# Patient Record
Sex: Male | Born: 1960 | Race: White | Hispanic: No | Marital: Married | Smoking: Never smoker
Health system: Southern US, Community
[De-identification: ages and names within clinical notes are randomized; demographics above are authoritative.]

## PROBLEM LIST (undated history)

## (undated) DIAGNOSIS — D649 Anemia, unspecified: Secondary | ICD-10-CM

## (undated) DIAGNOSIS — M1990 Unspecified osteoarthritis, unspecified site: Secondary | ICD-10-CM

## (undated) DIAGNOSIS — C4490 Unspecified malignant neoplasm of skin, unspecified: Secondary | ICD-10-CM

## (undated) DIAGNOSIS — M7989 Other specified soft tissue disorders: Secondary | ICD-10-CM

## (undated) DIAGNOSIS — Z87442 Personal history of urinary calculi: Secondary | ICD-10-CM

## (undated) DIAGNOSIS — N189 Chronic kidney disease, unspecified: Secondary | ICD-10-CM

## (undated) DIAGNOSIS — E559 Vitamin D deficiency, unspecified: Secondary | ICD-10-CM

## (undated) DIAGNOSIS — M549 Dorsalgia, unspecified: Secondary | ICD-10-CM

## (undated) DIAGNOSIS — M2550 Pain in unspecified joint: Secondary | ICD-10-CM

## (undated) DIAGNOSIS — L309 Dermatitis, unspecified: Secondary | ICD-10-CM

## (undated) DIAGNOSIS — T7840XA Allergy, unspecified, initial encounter: Secondary | ICD-10-CM

## (undated) DIAGNOSIS — Z5189 Encounter for other specified aftercare: Secondary | ICD-10-CM

## (undated) DIAGNOSIS — I10 Essential (primary) hypertension: Secondary | ICD-10-CM

## (undated) DIAGNOSIS — Q613 Polycystic kidney, unspecified: Secondary | ICD-10-CM

## (undated) DIAGNOSIS — Z940 Kidney transplant status: Secondary | ICD-10-CM

## (undated) HISTORY — DX: Anemia, unspecified: D64.9

## (undated) HISTORY — DX: Dorsalgia, unspecified: M54.9

## (undated) HISTORY — DX: Pain in unspecified joint: M25.50

## (undated) HISTORY — DX: Other specified soft tissue disorders: M79.89

## (undated) HISTORY — DX: Kidney transplant status: Z94.0

## (undated) HISTORY — PX: KNEE ARTHROSCOPY: SUR90

## (undated) HISTORY — DX: Vitamin D deficiency, unspecified: E55.9

## (undated) HISTORY — PX: MOHS SURGERY: SUR867

## (undated) HISTORY — PX: EYE SURGERY: SHX253

## (undated) HISTORY — PX: KNEE ARTHROPLASTY: SHX992

## (undated) HISTORY — DX: Essential (primary) hypertension: I10

## (undated) HISTORY — DX: Allergy, unspecified, initial encounter: T78.40XA

## (undated) HISTORY — PX: NEPHRECTOMY: SHX65

## (undated) HISTORY — DX: Encounter for other specified aftercare: Z51.89

## (undated) HISTORY — PX: INSERTION OF DIALYSIS CATHETER: SHX1324

## (undated) HISTORY — DX: Chronic kidney disease, unspecified: N18.9

## (undated) HISTORY — DX: Dermatitis, unspecified: L30.9

---

## 2004-03-07 HISTORY — PX: KIDNEY TRANSPLANT: SHX239

## 2005-10-10 ENCOUNTER — Ambulatory Visit (HOSPITAL_COMMUNITY): Admission: RE | Admit: 2005-10-10 | Discharge: 2005-10-10 | Payer: Self-pay | Admitting: Surgery

## 2011-02-02 DIAGNOSIS — M109 Gout, unspecified: Secondary | ICD-10-CM | POA: Insufficient documentation

## 2012-09-06 HISTORY — PX: COLONOSCOPY: SHX174

## 2013-02-21 DIAGNOSIS — E669 Obesity, unspecified: Secondary | ICD-10-CM | POA: Insufficient documentation

## 2014-09-19 ENCOUNTER — Ambulatory Visit: Payer: Self-pay | Admitting: Internal Medicine

## 2014-11-07 ENCOUNTER — Encounter: Payer: Self-pay | Admitting: Internal Medicine

## 2014-11-07 ENCOUNTER — Ambulatory Visit (INDEPENDENT_AMBULATORY_CARE_PROVIDER_SITE_OTHER): Payer: 59 | Admitting: Internal Medicine

## 2014-11-07 ENCOUNTER — Other Ambulatory Visit (INDEPENDENT_AMBULATORY_CARE_PROVIDER_SITE_OTHER): Payer: 59

## 2014-11-07 VITALS — BP 140/82 | HR 77 | Temp 98.1°F | Resp 18 | Ht 78.0 in | Wt 280.4 lb

## 2014-11-07 DIAGNOSIS — Z940 Kidney transplant status: Secondary | ICD-10-CM

## 2014-11-07 DIAGNOSIS — Q613 Polycystic kidney, unspecified: Secondary | ICD-10-CM

## 2014-11-07 DIAGNOSIS — R5383 Other fatigue: Secondary | ICD-10-CM

## 2014-11-07 DIAGNOSIS — I10 Essential (primary) hypertension: Secondary | ICD-10-CM

## 2014-11-07 DIAGNOSIS — E669 Obesity, unspecified: Secondary | ICD-10-CM

## 2014-11-07 DIAGNOSIS — R200 Anesthesia of skin: Secondary | ICD-10-CM

## 2014-11-07 LAB — VITAMIN B12: Vitamin B-12: 640 pg/mL (ref 211–911)

## 2014-11-07 LAB — TSH: TSH: 1.02 u[IU]/mL (ref 0.35–4.50)

## 2014-11-07 LAB — HEMOGLOBIN A1C: HEMOGLOBIN A1C: 5.2 % (ref 4.6–6.5)

## 2014-11-07 NOTE — Assessment & Plan Note (Signed)
Continues to follow with transplant center at [facility] and also has a nephrologist locally in [facility].

## 2014-11-07 NOTE — Assessment & Plan Note (Signed)
Checking B12, possibly some nerve injury versus neck injury.

## 2014-11-07 NOTE — Progress Notes (Signed)
Pre visit review using our clinic review tool, if applicable. No additional management support is needed unless otherwise documented below in the visit note. 

## 2014-11-07 NOTE — Assessment & Plan Note (Signed)
BP at goal today on his current regimen. Recent Cr 1.3 at nephrologist. Continue hctz, losartan, amlodipine.

## 2014-11-07 NOTE — Assessment & Plan Note (Signed)
Checking HgA1c to rule out sugar impairment.

## 2014-11-07 NOTE — Assessment & Plan Note (Signed)
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 No pain now and they were chronically bleeding as well.

## 2014-11-07 NOTE — Patient Instructions (Signed)
We will check some blood work today and call you back with the results.   Come back in 6-12 months for a check up. Please feel free to call the office if you are sick before next visit.

## 2014-11-07 NOTE — Progress Notes (Signed)
   Subjective:    Patient ID: Seth Morales, male    DOB: 1960-08-20, 54 y.o.   MRN: [id number]  HPI The patient is a 54 YO man coming in for numbness in some fingers since a fall in May. He was in [place] and was sick and ended up falling while in the shower. Since that time he has had numbness in 3 fingers. Originally it was much worse and now is improved some. Not full sensation in those fingers. No weakness of grip or dropping objects. No numbness in the hand or arm or shoulder. No neck pain. Please see A/P for status of chronic medical problems.   PMH, [facility], social history reviewed and updated.   Review of Systems  Constitutional: Positive for fatigue. Negative for fever, activity change, appetite change and unexpected weight change.  HENT: Negative.   Eyes: Negative.   Respiratory: Negative for cough, chest tightness, shortness of breath and wheezing.   Cardiovascular: Negative for chest pain, palpitations and leg swelling.  Gastrointestinal: Negative for nausea, abdominal pain, diarrhea, constipation and abdominal distention.  Musculoskeletal: Negative.   Skin: Negative.   Neurological: Positive for numbness. Negative for dizziness, weakness and light-headedness.  Psychiatric/Behavioral: Negative.       Objective:   Physical Exam  Constitutional: He is oriented to person, place, and time. He appears well-developed and well-nourished.  HENT:  Head: Normocephalic and atraumatic.  Eyes: EOM are normal.  Neck: Normal range of motion.  Cardiovascular: Normal rate and regular rhythm.   Pulmonary/Chest: Effort normal and breath sounds normal. No respiratory distress. He has no wheezes. He has no rales.  Abdominal: Soft. Bowel sounds are normal. He exhibits no distension. There is no tenderness. There is no rebound.  Musculoskeletal: He exhibits no edema.  Neurological: He is alert and oriented to person, place, and time. Coordination normal.  Skin: Skin is warm and dry.   Filed  Vitals:   11/07/14 1356  BP: 140/82  Pulse: 77  Temp: 98.1 F (36.7 C)  TempSrc: Oral  Resp: 18  Height:  (1.981 m)  Weight: 280 lb 6.4 oz (127.189 kg)  SpO2: 98%      Assessment & Plan:

## 2014-11-12 LAB — TESTOSTERONE, FREE, TOTAL, SHBG
Sex Hormone Binding: 23 nmol/L (ref 10–50)
TESTOSTERONE-% FREE: 2.3 % (ref 1.6–2.9)
Testosterone, Free: 47.7 pg/mL (ref 47.0–244.0)
Testosterone: 206 ng/dL — ABNORMAL LOW (ref 300–890)

## 2014-11-14 ENCOUNTER — Telehealth: Payer: Self-pay | Admitting: Internal Medicine

## 2014-11-14 NOTE — Telephone Encounter (Signed)
Received records from [facility] forwarded 38 pages to Dr. Genella Mech 11/14/14 fbg.

## 2015-07-02 ENCOUNTER — Ambulatory Visit (INDEPENDENT_AMBULATORY_CARE_PROVIDER_SITE_OTHER): Payer: BC Managed Care – PPO | Admitting: Internal Medicine

## 2015-07-02 ENCOUNTER — Encounter: Payer: Self-pay | Admitting: Internal Medicine

## 2015-07-02 VITALS — BP 130/78 | HR 68 | Temp 98.5°F | Resp 18 | Ht 78.0 in | Wt 280.0 lb

## 2015-07-02 DIAGNOSIS — M25522 Pain in left elbow: Secondary | ICD-10-CM | POA: Diagnosis not present

## 2015-07-02 MED ORDER — DICLOFENAC SODIUM 1 % TD GEL: 2.0000 g | Freq: Three times a day (TID) | TRANSDERMAL | Status: DC | PRN

## 2015-07-02 NOTE — Patient Instructions (Signed)
We have sent in voltaren gel for the elbow pain that you can use topically up to 3 times per day to help. Ice will be your friend to help those.

## 2015-07-02 NOTE — Progress Notes (Signed)
Pre visit review using our clinic review tool, if applicable. No additional management support is needed unless otherwise documented below in the visit note. 

## 2015-07-04 NOTE — Assessment & Plan Note (Signed)
Rx for voltaren gel and reminded sternly that he should avoid all NSAIDs with his transplanted kidney. He can schedule with sports med if not improving for steroid injection.

## 2015-07-04 NOTE — Progress Notes (Signed)
   Subjective:    Patient ID: Seth Morales, male    DOB: October 07, 1960, 55 y.o.   MRN: [id number]  HPI The patient is a 55 YO man coming in for pain in his left elbow. 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 He is bothered by it with playing but also is sore some of the time. Has been sparingly using aleve but with his kidney transplant he knows he is not supposed to use it. Tylenol is not helpful. He has tried icing it which was not helpful.   Review of Systems  Constitutional: Negative for fever, activity change, appetite change and unexpected weight change.  HENT: Negative.   Eyes: Negative.   Respiratory: Negative for cough, chest tightness, shortness of breath and wheezing.   Cardiovascular: Negative for chest pain, palpitations and leg swelling.  Gastrointestinal: Negative for nausea, abdominal pain, diarrhea, constipation and abdominal distention.  Musculoskeletal: Positive for arthralgias.  Skin: Negative.   Neurological: Positive for numbness. Negative for dizziness, weakness and light-headedness.  Psychiatric/Behavioral: Negative.       Objective:   Physical Exam  Constitutional: He is oriented to person, place, and time. He appears well-developed and well-nourished.  HENT:  Head: Normocephalic and atraumatic.  Eyes: EOM are normal.  Neck: Normal range of motion.  Cardiovascular: Normal rate and regular rhythm.   Pulmonary/Chest: Effort normal and breath sounds normal. No respiratory distress. He has no wheezes. He has no rales.  Abdominal: Soft. Bowel sounds are normal. He exhibits no distension. There is no tenderness. There is no rebound.  Musculoskeletal: He exhibits no edema.  Some tenderness on the ulnar side of the elbow with radiation into the hand, tinnel negative.   Neurological: He is alert and oriented to person, place, and time. Coordination normal.  Skin: Skin is warm and dry.   Filed Vitals:   07/02/15 1027  BP: 130/78   Pulse: 68  Temp: 98.5 F (36.9 C)  TempSrc: Oral  Resp: 18  Height: 6\' 6"  (1.981 m)  Weight: 280 lb (127.007 kg)  SpO2: 96%      Assessment & Plan:

## 2015-09-18 ENCOUNTER — Ambulatory Visit (INDEPENDENT_AMBULATORY_CARE_PROVIDER_SITE_OTHER): Payer: BC Managed Care – PPO | Admitting: Internal Medicine

## 2015-09-18 ENCOUNTER — Encounter: Payer: Self-pay | Admitting: Internal Medicine

## 2015-09-18 VITALS — BP 140/82 | HR 85 | Temp 99.5°F | Wt 274.4 lb

## 2015-09-18 DIAGNOSIS — M25561 Pain in right knee: Secondary | ICD-10-CM

## 2015-09-18 DIAGNOSIS — Z940 Kidney transplant status: Secondary | ICD-10-CM | POA: Diagnosis not present

## 2015-09-18 NOTE — Patient Instructions (Signed)
We will get you in soon for the orthopedic doctor for the knee to get it checked and possibly an injection in the knee.   It is okay to ice the knee and use the cream for the ribs or the knee for pain.   It is okay to use the muscle relaxers after shows if needed.
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
## 2015-09-18 NOTE — Progress Notes (Signed)
Pre visit review using our clinic review tool, if applicable. No additional management support is needed unless otherwise documented below in the visit note. 
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
## 2016-04-18 ENCOUNTER — Ambulatory Visit (INDEPENDENT_AMBULATORY_CARE_PROVIDER_SITE_OTHER): Payer: BC Managed Care – PPO | Admitting: Internal Medicine

## 2016-04-18 ENCOUNTER — Encounter: Payer: Self-pay | Admitting: Internal Medicine

## 2016-04-18 VITALS — BP 150/100 | HR 79 | Temp 97.9°F | Ht 78.0 in | Wt 294.8 lb

## 2016-04-18 DIAGNOSIS — K439 Ventral hernia without obstruction or gangrene: Secondary | ICD-10-CM | POA: Diagnosis not present

## 2016-04-18 NOTE — Progress Notes (Signed)
Pre visit review using our clinic review tool, if applicable. No additional management support is needed unless otherwise documented below in the visit note. 

## 2016-04-18 NOTE — Assessment & Plan Note (Signed)
Referral to general surgery. He is a Sales promotion account executiveprofessional musician and would like to have surgery early March if possible due to his schedule to allow for time for recovery.

## 2016-04-18 NOTE — Patient Instructions (Signed)
We will get you in with the general surgeon.

## 2016-04-18 NOTE — Progress Notes (Signed)
   Subjective:    Patient ID: Seth Morales, male    DOB: [birth year], 56 y.o.   MRN: [id number]  HPI The patient is a 56 YO man coming in for hernia on his stomach. He has had it for some time. 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 His was not painful until the last several months. He is now having pain with playing as well as sensitivity to the touch. No constipation or diarrhea. He is a renal transplant patient and up to date with that and meds and kidney function stable.   Review of Systems  Constitutional: Negative.   Respiratory: Negative.   Cardiovascular: Negative.   Gastrointestinal: Positive for abdominal distention and abdominal pain. Negative for constipation, diarrhea and nausea.  Musculoskeletal: Positive for myalgias.  Neurological: Negative.       Objective:   Physical Exam  Constitutional: He appears well-developed and well-nourished.  HENT:  Head: Normocephalic and atraumatic.  Eyes: EOM are normal.  Neck: Normal range of motion.  Cardiovascular: Normal rate and regular rhythm.   Pulmonary/Chest: Effort normal. No respiratory distress. He has no wheezes. He has no rales.  Abdominal: Soft. He exhibits distension. He exhibits no mass. There is tenderness. There is no rebound and no guarding.  Large ventral hernia midline superior to the umbilicus about 5-6 cm wide by 8-9 cm long, easily reducible.   Skin: Skin is warm and dry.   Vitals:   04/18/16 1514  BP: (!) 150/100  Pulse: 79  Temp: 97.9 F (36.6 C)  TempSrc: Oral  SpO2: 100%  Weight: 294 lb 12 oz (133.7 kg)  Height: 6\' 6"  (1.981 m)      Assessment & Plan:

## 2016-04-20 ENCOUNTER — Other Ambulatory Visit: Payer: Self-pay | Admitting: Internal Medicine

## 2016-04-20 DIAGNOSIS — K429 Umbilical hernia without obstruction or gangrene: Secondary | ICD-10-CM

## 2016-04-27 ENCOUNTER — Telehealth: Payer: Self-pay | Admitting: Internal Medicine

## 2016-04-27 NOTE — Telephone Encounter (Signed)
Pt called stated he wants to cancel the referral Dr. Okey Duprerawford put in for his hernia. He found someone that he likes and has an appt with Monday. [facility]seyes [facility].

## 2016-04-28 NOTE — Telephone Encounter (Signed)
Taken care of

## 2016-06-08 ENCOUNTER — Telehealth: Payer: Self-pay | Admitting: Surgical

## 2016-06-08 ENCOUNTER — Telehealth: Payer: Self-pay | Admitting: Internal Medicine

## 2016-06-08 ENCOUNTER — Encounter: Payer: Self-pay | Admitting: Family Medicine

## 2016-06-08 ENCOUNTER — Ambulatory Visit (INDEPENDENT_AMBULATORY_CARE_PROVIDER_SITE_OTHER): Payer: BC Managed Care – PPO | Admitting: Family Medicine

## 2016-06-08 ENCOUNTER — Ambulatory Visit (INDEPENDENT_AMBULATORY_CARE_PROVIDER_SITE_OTHER): Payer: BC Managed Care – PPO

## 2016-06-08 VITALS — BP 136/84 | HR 74 | Temp 98.0°F | Wt 293.4 lb

## 2016-06-08 DIAGNOSIS — R1012 Left upper quadrant pain: Secondary | ICD-10-CM

## 2016-06-08 DIAGNOSIS — R079 Chest pain, unspecified: Secondary | ICD-10-CM

## 2016-06-08 LAB — CBC WITH DIFFERENTIAL/PLATELET
Basophils Absolute: 86 cells/uL (ref 0–200)
Basophils Relative: 1 %
Eosinophils Absolute: 86 cells/uL (ref 15–500)
Eosinophils Relative: 1 %
HCT: 39.7 % (ref 38.5–50.0)
Hemoglobin: 13.3 g/dL (ref 13.2–17.1)
Lymphocytes Relative: 19 %
Lymphs Abs: 1634 cells/uL (ref 850–3900)
MCH: 29.8 pg (ref 27.0–33.0)
MCHC: 33.5 g/dL (ref 32.0–36.0)
MCV: 88.8 fL (ref 80.0–100.0)
MPV: 10.5 fL (ref 7.5–12.5)
Monocytes Absolute: 516 cells/uL (ref 200–950)
Monocytes Relative: 6 %
Neutro Abs: 6278 cells/uL (ref 1500–7800)
Neutrophils Relative %: 73 %
Platelets: 189 10*3/uL (ref 140–400)
RBC: 4.47 MIL/uL (ref 4.20–5.80)
RDW: 14.1 % (ref 11.0–15.0)
WBC: 8.6 10*3/uL (ref 3.8–10.8)

## 2016-06-08 MED ORDER — PREDNISONE 5 MG PO TABS: ORAL_TABLET | ORAL | 0 refills | Status: DC
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
## 2016-06-08 NOTE — Progress Notes (Signed)
Pre visit review using our clinic review tool, if applicable. No additional management support is needed unless otherwise documented below in the visit note. 

## 2016-06-08 NOTE — Telephone Encounter (Signed)
Patient is in office for acute visit with Dr. [name]. He requested to switch PCP's from Dr. Hillard Danker to Dr. Helane Rima.   Please respond at your earliest convenience to acknowledge the patient's request.   Thank you,  Hailey

## 2016-06-08 NOTE — Telephone Encounter (Signed)
Okay 

## 2016-06-09 ENCOUNTER — Other Ambulatory Visit (HOSPITAL_COMMUNITY): Payer: Self-pay | Admitting: Emergency Medicine

## 2016-06-09 LAB — COMPREHENSIVE METABOLIC PANEL
ALT: 31 U/L (ref 0–53)
AST: 24 U/L (ref 0–37)
Albumin: 4.3 g/dL (ref 3.5–5.2)
Alkaline Phosphatase: 65 U/L (ref 39–117)
BUN: 22 mg/dL (ref 6–23)
CO2: 28 mEq/L (ref 19–32)
Calcium: 9.5 mg/dL (ref 8.4–10.5)
Chloride: 103 mEq/L (ref 96–112)
Creatinine, Ser: 1.19 mg/dL (ref 0.40–1.50)
GFR: 67.44 mL/min (ref >60.00)
Glucose, Bld: 105 mg/dL — ABNORMAL HIGH (ref 70–99)
Potassium: 4.6 mEq/L (ref 3.5–5.1)
Sodium: 138 mEq/L (ref 135–145)
Total Bilirubin: 0.8 mg/dL (ref 0.2–1.2)
Total Protein: 6.9 g/dL (ref 6.0–8.3)

## 2016-06-09 LAB — TSH: TSH: 1.34 u[IU]/mL (ref 0.35–4.50)

## 2016-06-09 LAB — SEDIMENTATION RATE: Sed Rate: 7 mm/hr (ref 0–20)

## 2016-06-09 LAB — C-REACTIVE PROTEIN: CRP: 2.3 mg/L (ref <8.0)

## 2016-06-09 NOTE — Telephone Encounter (Signed)
Fine with me

## 2016-06-15 ENCOUNTER — Telehealth: Payer: Self-pay | Admitting: Surgical

## 2016-06-15 NOTE — Telephone Encounter (Signed)
-----   Message from Helane Rima, DO sent at 06/14/2016  8:52 PM EDT ----- Let's check on this patient. Any improvement?  ----- Message ----- From: Helane Rima, DO Sent: 06/12/2016   6:23 PM To: Helane Rima, DO  Check on patient on Monday.

## 2016-06-15 NOTE — Telephone Encounter (Signed)
Left message on patients voicemail that we was calling to check on him. Ask him to give [place] a call back and let us know.

## 2016-06-15 NOTE — Telephone Encounter (Signed)
Patient stated that he started feeling better the day after seeing Dr. [name]. Patient stated that he is doing great.

## 2016-07-06 ENCOUNTER — Ambulatory Visit: Payer: BC Managed Care – PPO | Admitting: Family Medicine

## 2016-07-06 ENCOUNTER — Ambulatory Visit (INDEPENDENT_AMBULATORY_CARE_PROVIDER_SITE_OTHER): Payer: BC Managed Care – PPO | Admitting: Family Medicine

## 2016-07-06 ENCOUNTER — Encounter: Payer: Self-pay | Admitting: Family Medicine

## 2016-07-06 VITALS — BP 116/72 | HR 93 | Temp 98.3°F | Ht 78.0 in | Wt 292.4 lb

## 2016-07-06 DIAGNOSIS — R1012 Left upper quadrant pain: Secondary | ICD-10-CM

## 2016-07-06 DIAGNOSIS — Z940 Kidney transplant status: Secondary | ICD-10-CM | POA: Diagnosis not present

## 2016-07-06 MED ORDER — PREDNISONE 5 MG PO TABS
5.0000 mg | ORAL_TABLET | Freq: Every day | ORAL | 2 refills | Status: DC
Start: 2016-07-06 — End: 2022-09-14

## 2016-07-06 NOTE — Progress Notes (Signed)
Seth Morales is a 56 y.o. male is here for follow up.  History of Present Illness:   Seth Morales CMA acting as scribe for Dr. Juleen Morales.  CC: Patient comes in today for follow up of abdominal pain. He states that since he has taken the prednisone the pain has went away.  HPI: The patient is doing well since his last visit. He states that the prednisone helped to alleviate his left upper quadrant and chest pain within 24 hours. He is able to play trumpet for up to 3 hours at a time with minimal discomfort that he is always now located in the left lower quadrant. This is the area of his previous surgeries. He denies any bowel or bladder changes. He denies any hernia symptoms. He denies any new symptoms today.  Health Maintenance Due  Topic Date Due  . Hepatitis C Screening  10/31/1960  . HIV Screening  05/16/1976   PMHx, SurgHx, SocialHx, FamHx, Medications, and Allergies were reviewed in the Visit Navigator and updated as appropriate.   Patient Active Problem List   Diagnosis Date Noted  . Right knee pain 09/18/2015  . Elbow pain 07/04/2015  . Polycystic kidney disease 11/07/2014  . Essential hypertension 11/07/2014  . S/P kidney transplant 11/07/2014  . Obesity 11/07/2014  . Numbness of finger 11/07/2014   Social History  Substance Use Topics  . Smoking status: Never Smoker  . Smokeless tobacco: Never Used  . Alcohol use No   Current Medications and Allergies:   Current Outpatient Prescriptions:  .  allopurinol (ZYLOPRIM) 100 MG tablet, Take 100 mg by mouth daily., Disp: , Rfl:  .  amLODipine (NORVASC) 5 MG tablet, Take 5 mg by mouth daily., Disp: , Rfl:  .  cycloSPORINE (SANDIMMUNE) 100 MG capsule, Take 100 mg by mouth 2 (two) times daily., Disp: , Rfl:  .  cycloSPORINE (SANDIMMUNE) 25 MG capsule, Take 25 mg by mouth 2 (two) times daily., Disp: , Rfl:  .  docusate sodium (COLACE) 100 MG capsule, Take 100 mg by mouth 2 (two) times daily., Disp: , Rfl:  .   hydrochlorothiazide (MICROZIDE) 12.5 MG capsule, Take 12.5 mg by mouth every other day., Disp: , Rfl:  .  losartan (COZAAR) 100 MG tablet, Take 100 mg by mouth daily., Disp: , Rfl:  .  magnesium oxide-pyridoxine (BEELITH) 362-20 MG TABS, Take 1 tablet by mouth daily., Disp: , Rfl:  .  Multiple Vitamins-Minerals (MULTIVITAMIN WITH MINERALS) tablet, Take 1 tablet by mouth daily., Disp: , Rfl:  .  mycophenolate (CELLCEPT) 250 MG capsule, Take 750 mg by mouth 2 (two) times daily., Disp: , Rfl:  .  predniSONE (DELTASONE) 5 MG tablet, Take 1 tablet (5 mg total) by mouth daily with breakfast., Disp: 90 tablet, Rfl: 2   No Known Allergies   Review of Systems   Review of Systems  Constitutional: Negative for chills and malaise/fatigue.  HENT: Negative for congestion, ear pain, sinus pain and sore throat.   Eyes: Negative for blurred vision and double vision.  Respiratory: Negative for cough, shortness of breath and wheezing.   Cardiovascular: Negative for chest pain, palpitations and leg swelling.  Musculoskeletal: Negative for back pain, joint pain and neck pain.  Neurological: Negative for dizziness.  Psychiatric/Behavioral: Negative for depression, hallucinations and memory loss.   Vitals:   Vitals:   07/06/16 1513  BP: 116/72  Pulse: 93  Temp: 98.3 F (36.8 C)  TempSrc: Oral  SpO2: 96%  Weight: 292 lb 6.4 oz (  132.6 kg)  Height: _0  (1.981 m)     Body mass index is 33.79 kg/m.   Physical Exam:    Physical Exam  Constitutional: He is oriented to person, place, and time. He appears well-developed and well-nourished. No distress.  HENT:  Head: Normocephalic and atraumatic.  Right Ear: External ear normal.  Left Ear: External ear normal.  Nose: Nose normal.  Mouth/Throat: Oropharynx is clear and moist.  Eyes: Conjunctivae and EOM are normal. Pupils are equal, round, and reactive to light.  Neck: Normal range of motion. Neck supple.  Cardiovascular: Normal rate, regular  rhythm, normal heart sounds and intact distal pulses.   Pulmonary/Chest: Effort normal and breath sounds normal.  Abdominal: Soft. Bowel sounds are normal.  Musculoskeletal: Normal range of motion.  Neurological: He is alert and oriented to person, place, and time.  Skin: Skin is warm and dry.  Psychiatric: He has a normal mood and affect. His behavior is normal. Judgment and thought content normal.  Nursing note and vitals reviewed. Ates pain Results for orders placed or performed in visit on 06/08/16  Comprehensive metabolic panel  Result Value Ref Range   Sodium 138 135 - 145 mEq/L   Potassium 4.6 3.5 - 5.1 mEq/L   Chloride 103 96 - 112 mEq/L   CO2 28 19 - 32 mEq/L   Glucose, Bld 105 (H) 70 - 99 mg/dL   BUN 22 6 - 23 mg/dL   Creatinine, Ser 1.19 0.40 - 1.50 mg/dL   Total Bilirubin 0.8 0.2 - 1.2 mg/dL   Alkaline Phosphatase 65 39 - 117 U/L   AST 24 0 - 37 U/L   ALT 31 0 - 53 U/L   Total Protein 6.9 6.0 - 8.3 g/dL   Albumin 4.3 3.5 - 5.2 g/dL   Calcium 9.5 8.4 - 10.5 mg/dL   GFR 67.44 >60.00 mL/min  Sedimentation rate  Result Value Ref Range   Sed Rate 7 0 - 20 mm/hr  TSH  Result Value Ref Range   TSH 1.34 0.35 - 4.50 uIU/mL  CBC with Differential/Platelet  Result Value Ref Range   WBC 8.6 3.8 - 10.8 K/uL   RBC 4.47 4.20 - 5.80 MIL/uL   Hemoglobin 13.3 13.2 - 17.1 g/dL   HCT 39.7 38.5 - 50.0 %   MCV 88.8 80.0 - 100.0 fL   MCH 29.8 27.0 - 33.0 pg   MCHC 33.5 32.0 - 36.0 g/dL   RDW 14.1 11.0 - 15.0 %   Platelets 189 140 - 400 K/uL   MPV 10.5 7.5 - 12.5 fL   Neutro Abs 6,278 1,500 - 7,800 cells/uL   Lymphs Abs 1,634 850 - 3,900 cells/uL   Monocytes Absolute 516 200 - 950 cells/uL   Eosinophils Absolute 86 15 - 500 cells/uL   Basophils Absolute 86 0 - 200 cells/uL   Neutrophils Relative % 73 %   Lymphocytes Relative 19 %   Monocytes Relative 6 %   Eosinophils Relative 1 %   Basophils Relative 1 %   Smear Review Criteria for review not met   C-reactive protein    Result Value Ref Range   CRP 2.3 <8.0 mg/L  [name] Assessment and Plan:   Seth Morales was seen today for abdominal pain.  Diagnoses and all orders for this visit:  S/P kidney transplant Comments: Okay to refill medication for medication. Orders: -     predniSONE (DELTASONE) 5 MG tablet; Take 1 tablet (5 mg total) by  mouth daily with breakfast.  Left upper quadrant pain Comments: Resolved.    . Reviewed expectations re: course of current medical issues. . Discussed self-management of symptoms. . Outlined signs and symptoms indicating need for more acute intervention. . Patient verbalized understanding and all questions were answered. . See orders for this visit as documented in the electronic medical record. . Patient received an After Visit Summary.   CMA served as Education administrator during this visit. History, Physical, and Plan performed by medical provider. Documentation and orders reviewed and attested to. Briscoe Deutscher, D.O.  Briscoe Deutscher, [place], [place] 07/06/2016  Future Appointments Date Time Provider [place]  01/12/2017 3:15 PM Briscoe Deutscher, DO [facility] None

## 2016-10-04 ENCOUNTER — Ambulatory Visit (INDEPENDENT_AMBULATORY_CARE_PROVIDER_SITE_OTHER): Payer: BC Managed Care – PPO | Admitting: Family Medicine

## 2016-10-04 ENCOUNTER — Encounter: Payer: Self-pay | Admitting: Family Medicine

## 2016-10-04 VITALS — BP 136/80 | HR 67 | Temp 98.2°F | Ht 78.0 in | Wt 289.6 lb

## 2016-10-04 DIAGNOSIS — S76301A Unspecified injury of muscle, fascia and tendon of the posterior muscle group at thigh level, right thigh, initial encounter: Secondary | ICD-10-CM | POA: Diagnosis not present

## 2016-10-04 MED ORDER — HYDROCODONE-ACETAMINOPHEN 5-325 MG PO TABS: 1.0000 | ORAL_TABLET | Freq: Four times a day (QID) | ORAL | 0 refills | Status: DC | PRN

## 2016-10-04 NOTE — Progress Notes (Signed)
Seth Morales is a 56 y.o. male here for an acute visit.  History of Present Illness:   Seth Morales CMA acting as scribe for Dr. Earlene Morales.  HPI: Patient comes in today for right leg pain in the hamstring area. He went to throw away a receipt on Thursday night and heard something pop. He now has bruising all down the back of the right thigh. He stated that he has just finished three gel knee injections. His pain scale is staying at about a seven. Has taken Aleve for the pain. Pain starts on the upper thigh and large hematoma. Pulses in right leg was checked and fine today.  PMHx, SurgHx, SocialHx, Medications, and Allergies were reviewed in the Visit Navigator and updated as appropriate.  Current Medications:   .  allopurinol (ZYLOPRIM) 100 MG tablet, Take 100 mg by mouth daily., Disp: , Rfl:  .  amLODipine (NORVASC) 5 MG tablet, Take 5 mg by mouth daily., Disp: , Rfl:  .  cycloSPORINE (SANDIMMUNE) 100 MG capsule, Take 100 mg by mouth 2 (two) times daily., Disp: , Rfl:  .  cycloSPORINE (SANDIMMUNE) 25 MG capsule, Take 25 mg by mouth 2 (two) times daily., Disp: , Rfl:  .  docusate sodium (COLACE) 100 MG capsule, Take 100 mg by mouth 2 (two) times daily., Disp: , Rfl:  .  hydrochlorothiazide (MICROZIDE) 12.5 MG capsule, Take 12.5 mg by mouth every other day., Disp: , Rfl:  .  losartan (COZAAR) 100 MG tablet, Take 100 mg by mouth daily., Disp: , Rfl:  .  magnesium oxide-pyridoxine (BEELITH) 362-20 MG TABS, Take 1 tablet by mouth daily., Disp: , Rfl:  .  Multiple Vitamins-Minerals (MULTIVITAMIN WITH MINERALS) tablet, Take 1 tablet by mouth daily., Disp: , Rfl:  .  mycophenolate (CELLCEPT) 250 MG capsule, Take 750 mg by mouth 2 (two) times daily., Disp: , Rfl:  .  predniSONE (DELTASONE) 5 MG tablet, Take 1 tablet (5 mg total) by mouth daily with breakfast., Disp: 90 tablet, Rfl: 2   No Known Allergies   Review of Systems:   Pertinent items are noted in the HPI. Otherwise, ROS is  negative.  Vitals:   Vitals:   10/04/16 1049  BP: 136/80  Pulse: 67  Temp: 98.2 F (36.8 C)  TempSrc: Oral  SpO2: 99%  Weight: 289 lb 9.6 oz (131.4 kg)  Height: 6\' 6"  (1.981 Morales)     Body mass index is 33.47 kg/Morales.  Physical Exam:   Physical Exam  Assessment and Plan:   Seth Morales was seen today for leg pain.  Diagnoses and all orders for this visit:  Right hamstring injury, initial encounter Comments: Extensive tear with suspected hematoma. Pain control as below. He will be traveling in a week. Will ask Sports Medicine to evaluate patient for red flags.  Orders: -     Ambulatory referral to Sports Medicine -     HYDROcodone-acetaminophen (NORCO/VICODIN) 5-325 MG tablet; Take 1 tablet by mouth every 6 (six) hours as needed for moderate pain.    . Reviewed expectations re: course of current medical issues. . Discussed self-management of symptoms. . Outlined signs and symptoms indicating need for more acute intervention. . Patient verbalized understanding and all questions were answered. Marland Kitchen. Health Maintenance issues including appropriate healthy diet, exercise, and smoking avoidance were discussed with patient. . See orders for this visit as documented in the electronic medical record. . Patient received an After Visit Summary.  CMA served as Seth Morales during this visit. History,  Physical, and Plan performed by medical provider. The above documentation has been reviewed and is accurate and complete. Seth Morales, D.O.  Seth RimaErica Kin Galbra[place], Morales [place], [place] 10/09/2016  Future Appointments Date Time Provider Department Center  10/25/2016 2:15 PM Seth [facility]essertSmith, [facility]  01/12/2017 3:15 PM Seth RimaWallace, Seth Sanfilippo, Morales [facility] None

## 2016-10-05 NOTE — Progress Notes (Signed)
Seth ScaleZach Morales D.O. [facility] [address] [place], [place] [address] Phone: [phone] Subjective:    I'm seeing this patient by the request  of:  Helane RimaWallace, Erica, DO  CC: right leg pain   VHQ:[id number]:Subjective  Seth Morales is a 56 y.o. male coming in with complaint of right leg pain. A week ago he was on a trip to [place] when he bent forward and threw a receipt away in a trash can and felt his hamstring pop.He does have a history of hamstring injuries from playing baseball in high school. Patient has had a history of a hamstring tear on the left leg. Patient is having a hard time sleeping due to the pain. He has been using compression, ice and elevation with nothing relieving his pain. He notes bruising on the right hamstring as well.  Onset- one week ago Location- right hamstring Duration- constant pain Character- sharp Aggravating factors- movement Reliving factors- nothing Therapies tried- hydrocodone, ice, elevation Severity-7/10     Past Medical History:  Diagnosis Date  . Allergy   . Blood transfusion without reported diagnosis   . Chronic kidney disease   . Hypertension    Past Surgical History:  Procedure Laterality Date  . EYE SURGERY    . INSERTION OF DIALYSIS CATHETER    . KIDNEY TRANSPLANT  2006  . KNEE ARTHROPLASTY Bilateral   . NEPHRECTOMY Bilateral    Social History   Social History  . Marital status: Single    Spouse name: N/A  . Number of children: N/A  . Years of education: N/A   Social History Main Topics  . Smoking status: Never Smoker  . Smokeless tobacco: Never Used  . Alcohol use No  . Drug use: No  . Sexual activity: Not on file   Other Topics Concern  . Not on file   Social History Narrative  . No narrative on file   No Known Allergies Family History  Problem Relation Age of Onset  . Arthritis Mother   . Hypertension Mother   . Kidney disease Mother      Past medical history, social, surgical and family history all  reviewed in electronic medical record.  No pertanent information unless stated regarding to the chief complaint.   Review of Systems:Review of systems updated and as accurate as of 10/05/16  No headache, visual changes, nausea, vomiting, diarrhea, constipation, dizziness, abdominal pain, skin rash, fevers, chills, night sweats, weight loss, swollen lymph nodes, body aches, joint swelling, muscle aches, chest pain, shortness of breath, mood changes.   Objective  There were no vitals taken for this visit. Systems examined below as of 10/05/16   General: No apparent distress alert and oriented x3 mood and affect normal, dressed appropriately.  HEENT: Pupils equal, extraocular movements intact  Respiratory: Patient's speak in full sentences and does not appear short of breath  Cardiovascular: No lower extremity edema, non tender, no erythema  Skin: Warm dry intact with no signs of infection or rash on extremities or on axial skeleton.  Abdomen: Soft nontender  Neuro: Cranial nerves II through XII are intact, neurovascularly intact in all extremities with 2+ DTRs and 2+ pulses.  Lymph: No lymphadenopathy of posterior or anterior cervical chain or axillae bilaterally.  Gait Antalgic gait MSK:  Non tender with full range of motion and good stability and symmetric strength and tone of shoulders, elbows, wrist, hip, and ankles bilaterally.  Right leg exam shows the patient is in significant bruising over the  medial hamstring proximally. She does have pain with flexion of the hamstring. Neurovascular intact distally. Patient's knee does have an effusion noted and patient does have mild arthritic changes. Patient does have pain in the muscle belly of the hamstring. No defect oh noted.  Limited musculoskeletal ultrasound was performed and interpreted by Judi SaaZachary M Ladley  Limited ultrasound shows the patient did have a large tear within the muscle itself of the hamstring. 97 m long. Mild small hematoma  noted. Mild increase in Doppler flow in the area. Tendon does appear to be intact on the tibia. Tendon is also intact proximally. Impression: Hamstring tear with small hematoma   Impression and Recommendations:     This case required medical decision making of moderate complexity.      Note: This dictation was prepared with Dragon dictation along with smaller phrase technology. Any transcriptional errors that result from this process are unintentional.

## 2016-10-06 ENCOUNTER — Ambulatory Visit (INDEPENDENT_AMBULATORY_CARE_PROVIDER_SITE_OTHER): Payer: BC Managed Care – PPO | Admitting: Family Medicine

## 2016-10-06 ENCOUNTER — Ambulatory Visit: Payer: Self-pay

## 2016-10-06 ENCOUNTER — Encounter: Payer: Self-pay | Admitting: Family Medicine

## 2016-10-06 VITALS — BP 110/84 | HR 78 | Ht 78.0 in | Wt 289.0 lb

## 2016-10-06 DIAGNOSIS — S76311A Strain of muscle, fascia and tendon of the posterior muscle group at thigh level, right thigh, initial encounter: Secondary | ICD-10-CM | POA: Diagnosis not present

## 2016-10-06 DIAGNOSIS — M79604 Pain in right leg: Secondary | ICD-10-CM | POA: Diagnosis not present

## 2016-10-06 MED ORDER — DICLOFENAC SODIUM 2 % TD SOLN: 2.0000 "application " | Freq: Two times a day (BID) | TRANSDERMAL | 3 refills | Status: DC

## 2016-10-06 NOTE — Assessment & Plan Note (Signed)
Tear noted, mild hematoma Compression  Discussed icing regimen, topical anti-inflammatories, patient come back in 2 weeks. As well as improving start home exercises.

## 2016-10-06 NOTE — Patient Instructions (Addendum)
Good to see you.  :Large hamstring tear Ice 20 minutes 2 times daily. Usually after activity and before bed. Compression sleeve to the thigh with activity for next 2 weeks at least  pennsaid pinkie amount topically 2 times daily as needed.   \arnica lotion 2 times a day  See me again in 2 weeks and if better we will start exercises

## 2016-10-25 ENCOUNTER — Ambulatory Visit (INDEPENDENT_AMBULATORY_CARE_PROVIDER_SITE_OTHER): Payer: BC Managed Care – PPO | Admitting: Family Medicine

## 2016-10-25 ENCOUNTER — Encounter: Payer: Self-pay | Admitting: Family Medicine

## 2016-10-25 DIAGNOSIS — S76311A Strain of muscle, fascia and tendon of the posterior muscle group at thigh level, right thigh, initial encounter: Secondary | ICD-10-CM | POA: Diagnosis not present

## 2016-10-25 NOTE — Patient Instructions (Signed)
Good to see you  You are doing great and ahead of schedule Exercises 3 times a week.  Keep doing the pennsaid if helping OK to walk but keep stride length small.  Ice after activity  See me again in 4-6 weeks

## 2016-10-25 NOTE — Progress Notes (Signed)
[name] [address] [place], [place] [address] Phone: [phone] Subjective:    I'm seeing this patient by the request  of:  Helane Rima, DO  CC: right leg pain f/u  BJY:[id number]  Seth Morales is a 56 y.o. male coming in with complaint of right leg pain. Patient was found to have a hamstring tear with a hematoma noted. Patient was to do conservative therapy with home exercises, icing regimen, which activities doing which ones to avoid. Patient states He is making improvement. Not having pain with regular daily activities. Maybe some mild pain with going down stairs. Some mild cramping at night but very minimal. Patient denies any numbness. States that if he still touches the area can be more painful. Has not done any other exercises at this time.      Past Medical History:  Diagnosis Date  . Allergy   . Blood transfusion without reported diagnosis   . Chronic kidney disease   . Hypertension    Past Surgical History:  Procedure Laterality Date  . EYE SURGERY    . INSERTION OF DIALYSIS CATHETER    . KIDNEY TRANSPLANT  2006  . KNEE ARTHROPLASTY Bilateral   . NEPHRECTOMY Bilateral    Social History   Social History  . Marital status: Single    Spouse name: N/A  . Number of children: N/A  . Years of education: N/A   Social History Main Topics  . Smoking status: Never Smoker  . Smokeless tobacco: Never Used  . Alcohol use No  . Drug use: No  . Sexual activity: Not Asked   Other Topics Concern  . None   Social History Narrative  . None   No Known Allergies Family History  Problem Relation Age of Onset  . Arthritis Mother   . Hypertension Mother   . Kidney disease Mother      Past medical history, social, surgical and family history all reviewed in electronic medical record.  No pertanent information unless stated regarding to the chief complaint.   Review of Systems: No headache, visual changes, nausea, vomiting,  diarrhea, constipation, dizziness, abdominal pain, skin rash, fevers, chills, night sweats, weight loss, swollen lymph nodes, body aches, joint swelling, muscle aches, chest pain, shortness of breath, mood changes.    Objective  Blood pressure 122/78, pulse 69, height 6\' 6"  (1.981 m), weight 290 lb (131.5 kg), SpO2 97 %.  Systems examined below as of 10/25/16 General: NAD A&O x3 mood, affect normal  HEENT: Pupils equal, extraocular movements intact no nystagmus Respiratory: not short of breath at rest or with speaking Cardiovascular: No lower extremity edema, non tender Skin: Warm dry intact with no signs of infection or rash on extremities or on axial skeleton. Abdomen: Soft nontender, no masses Neuro: Cranial nerves  intact, neurovascularly intact in all extremities with 2+ DTRs and 2+ pulses. Lymph: No lymphadenopathy appreciated today  Gait normal with good balance and coordination.  MSK: Non tender with full range of motion and good stability and symmetric strength and tone of shoulders, elbows, wrist,  knee hips and ankles bilaterally.   Patient will resume bruising. Noticing any swelling. Still tender to palpation over the medial aspect of the hamstring more distal. Still some mild weakness compared to contralateral sign. Neurovascularly intact distally.    [id number]; 15 additional minutes spent for Therapeutic exercises as stated in above notes.  This included exercises focusing on stretching, strengthening, with significant focus on eccentric aspects.  Long term goals include an improvement in range of motion, strength, endurance as well as avoiding reinjury. Patient's frequency would include in 1-2 times a day, 3-5 times a week for a duration of 6-12 weeks.  Hamstring curls: Start with 3 sets of 15 (no weight); Progress by 5 reps every 3 days until you reach 3 sets of 30; After 3 days at 3 sets of 30, add 2lb ankle weight at 3 sets of 10; Increase every 5 days by 5 reps. You may add 2lbs  ankle weight once weekly. Hamstring swings- swing leg backwards and curl at the end of the swing. Follow same schedule as above. Hamstring running lunges- running lunge position means no more than 45 degrees of knee flexion and running motion. Follow same schedule as above. Proper technique shown and discussed handout in great detail with ATC.  All questions were discussed and answered.   Impression and Recommendations:     This case required medical decision making of moderate complexity.      Note: This dictation was prepared with Dragon dictation along with smaller phrase technology. Any transcriptional errors that result from this process are unintentional.

## 2016-10-25 NOTE — Assessment & Plan Note (Signed)
Patient given home exercises and work with Event organiser after the office visit. We discussed continuing the compression and icing regimen. Avoiding any high impact exercises. Patient will follow-up with me again in 4 weeks. At that time we'll consider ultrasound and potentially even starting physical therapy if necessary.

## 2016-12-02 ENCOUNTER — Ambulatory Visit: Payer: BC Managed Care – PPO | Admitting: Family Medicine

## 2017-01-11 ENCOUNTER — Encounter: Payer: Self-pay | Admitting: Family Medicine

## 2017-01-11 ENCOUNTER — Ambulatory Visit: Payer: BC Managed Care – PPO | Admitting: Family Medicine

## 2017-01-11 VITALS — BP 122/68 | HR 93 | Temp 98.0°F | Ht 78.0 in | Wt 289.0 lb

## 2017-01-11 DIAGNOSIS — R05 Cough: Secondary | ICD-10-CM | POA: Diagnosis not present

## 2017-01-11 DIAGNOSIS — Z23 Encounter for immunization: Secondary | ICD-10-CM | POA: Diagnosis not present

## 2017-01-11 DIAGNOSIS — R059 Cough, unspecified: Secondary | ICD-10-CM

## 2017-01-11 MED ORDER — AZITHROMYCIN 250 MG PO TABS: ORAL_TABLET | ORAL | 0 refills | Status: DC

## 2017-01-11 MED ORDER — GUAIFENESIN-CODEINE 100-10 MG/5ML PO SYRP: 10.0000 mL | ORAL_SOLUTION | Freq: Every evening | ORAL | 0 refills | Status: DC | PRN

## 2017-01-11 NOTE — Progress Notes (Signed)
Seth Morales is a 56 y.o. male is here for follow up.  History of Present Illness:   Cough  This is a new problem. The current episode started 1 to 4 weeks ago. The problem has been rapidly worsening. The cough is productive of purulent sputum. Associated symptoms include nasal congestion and postnasal drip. Pertinent negatives include no chest pain, fever, heartburn, hemoptysis, myalgias, rash, shortness of breath, sweats, weight loss or wheezing. The symptoms are aggravated by lying down. He has tried OTC cough suppressant for the symptoms. The treatment provided no relief.   Health Maintenance Due  Topic Date Due  . Hepatitis C Screening  [birth year]  . HIV Screening  05/16/1976  . INFLUENZA VACCINE  10/05/2016   No flowsheet data found. PMHx, SurgHx, SocialHx, FamHx, Medications, and Allergies were reviewed in the Visit Navigator and updated as appropriate.   Patient Active Problem List   Diagnosis Date Noted  . Tear of right hamstring 10/06/2016  . Polycystic kidney disease 11/07/2014  . Essential hypertension 11/07/2014  . 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 Obesity (BMI 30-39.9) 02/21/2013  . Gout 02/02/2011   Social History   Tobacco Use  . Smoking status: Never Smoker  . Smokeless tobacco: Never Used  Substance Use Topics  . Alcohol use: No    Alcohol/week: 0.0 oz  . Drug use: No   Current Medications and Allergies:   .  allopurinol (ZYLOPRIM) 100 MG tablet, Take 100 mg by mouth daily., Disp: , Rfl:  .  amLODipine (NORVASC) 5 MG tablet, Take 5 mg by mouth daily., Disp: , Rfl:  .  cycloSPORINE (SANDIMMUNE) 100 MG capsule, Take 100 mg by mouth 2 (two) times daily., Disp: , Rfl:  .  cycloSPORINE (SANDIMMUNE) 25 MG capsule, Take 25 mg by mouth 2 (two) times daily., Disp: , Rfl:  .  Diclofenac Sodium (PENNSAID) 2 % SOLN, Place 2 application onto the skin 2 (two) times daily., Disp: 112 g, Rfl: 3 .  docusate sodium (COLACE) 100 MG capsule, Take  100 mg by mouth 2 (two) times daily., Disp: , Rfl:  .  hydrochlorothiazide (MICROZIDE) 12.5 MG capsule, Take 12.5 mg by mouth every other day., Disp: , Rfl:  .  HYDROcodone-acetaminophen (NORCO/VICODIN) 5-325 MG tablet, Take 1 tablet by mouth every 6 (six) hours as needed for moderate pain., Disp: 20 tablet, Rfl: 0 .  losartan (COZAAR) 100 MG tablet, Take 100 mg by mouth daily., Disp: , Rfl:  .  magnesium oxide-pyridoxine (BEELITH) 362-20 MG TABS, Take 1 tablet by mouth daily., Disp: , Rfl:  .  Multiple Vitamins-Minerals (MULTIVITAMIN WITH MINERALS) tablet, Take 1 tablet by mouth daily., Disp: , Rfl:  .  mycophenolate (CELLCEPT) 250 MG capsule, Take 750 mg by mouth 2 (two) times daily., Disp: , Rfl:  .  predniSONE (DELTASONE) 5 MG tablet, Take 1 tablet (5 mg total) by mouth daily with breakfast., Disp: 90 tablet, Rfl: 2  No Known Allergies   Review of Systems   Pertinent items are noted in the HPI. Otherwise, ROS is negative.  Vitals:   Vitals:   01/11/17 1306  BP: 122/68  Pulse: 93  Temp: 98 F (36.7 C)  TempSrc: Oral  SpO2: 96%  Weight: 289 lb (131.1 kg)  Height: 6\' 6"  (1.981 m)     Body mass index is 33.4 kg/m.   Physical Exam:   Physical Exam  Constitutional: He is oriented to person, place, and time. He appears well-developed and well-nourished. No distress.  HENT:  Head: Normocephalic and atraumatic.  Right Ear: External ear normal.  Left Ear: External ear normal.  Nose: Nose normal.  Mouth/Throat: Oropharynx is clear and moist.  Eyes: Conjunctivae and EOM are normal. Pupils are equal, round, and reactive to light.  Neck: Normal range of motion. Neck supple.  Cardiovascular: Normal rate, regular rhythm, normal heart sounds and intact distal pulses.  Pulmonary/Chest: Effort normal and breath sounds normal.  Abdominal: Soft. Bowel sounds are normal.  Musculoskeletal: Normal range of motion.  Neurological: He is alert and oriented to person, place, and time.  Skin:  Skin is warm and dry.  Psychiatric: He has a normal mood and affect. His behavior is normal. Judgment and thought content normal.  Nursing note and vitals reviewed.   Assessment and Plan:   Seth Morales was seen today for follow-up.  Diagnoses and all orders for this visit:  Cough Comments: Treatment as below. Consider reflux as etiology if not improving.  Orders: -     azithromycin (ZITHROMAX) 250 MG tablet; 2 po on day one, then one po daily until gone -     guaiFENesin-codeine (CHERATUSSIN AC) 100-10 MG/5ML syrup; Take 10 mLs at bedtime as needed by mouth for cough or congestion.   . Reviewed expectations re: course of current medical issues. . Discussed self-management of symptoms. . Outlined signs and symptoms indicating need for more acute intervention. . Patient verbalized understanding and all questions were answered. Marland Kitchen. Health Maintenance issues including appropriate healthy diet, exercise, and smoking avoidance were discussed with patient. . See orders for this visit as documented in the electronic medical record. . Patient received an After Visit Summary.   Helane RimaErica Urbano Milhouse, DO [place], [facility] 01/11/2017

## 2017-01-12 ENCOUNTER — Ambulatory Visit: Payer: BC Managed Care – PPO | Admitting: Family Medicine

## 2017-07-12 ENCOUNTER — Encounter: Payer: Self-pay | Admitting: Family Medicine

## 2017-07-12 ENCOUNTER — Ambulatory Visit: Payer: BC Managed Care – PPO | Admitting: Family Medicine

## 2017-07-12 VITALS — BP 122/68 | HR 83 | Temp 98.3°F | Ht 78.0 in | Wt 294.4 lb

## 2017-07-12 DIAGNOSIS — Z13220 Encounter for screening for lipoid disorders: Secondary | ICD-10-CM

## 2017-07-12 DIAGNOSIS — I10 Essential (primary) hypertension: Secondary | ICD-10-CM | POA: Diagnosis not present

## 2017-07-12 DIAGNOSIS — Z1159 Encounter for screening for other viral diseases: Secondary | ICD-10-CM

## 2017-07-12 DIAGNOSIS — Z1211 Encounter for screening for malignant neoplasm of colon: Secondary | ICD-10-CM | POA: Diagnosis not present

## 2017-07-12 DIAGNOSIS — R5383 Other fatigue: Secondary | ICD-10-CM | POA: Diagnosis not present

## 2017-07-12 DIAGNOSIS — E669 Obesity, unspecified: Secondary | ICD-10-CM | POA: Diagnosis not present

## 2017-07-12 DIAGNOSIS — Z0000 Encounter for general adult medical examination without abnormal findings: Secondary | ICD-10-CM

## 2017-07-12 LAB — CBC WITH DIFFERENTIAL/PLATELET
Basophils Absolute: 0 10*3/uL (ref 0.0–0.1)
Basophils Relative: 0.5 % (ref 0.0–3.0)
Eosinophils Absolute: 0 10*3/uL (ref 0.0–0.7)
Eosinophils Relative: 0.5 % (ref 0.0–5.0)
HCT: 40.4 % (ref 39.0–52.0)
Hemoglobin: 13.8 g/dL (ref 13.0–17.0)
Lymphocytes Relative: 15.7 % (ref 12.0–46.0)
Lymphs Abs: 1.2 10*3/uL (ref 0.7–4.0)
MCHC: 34.2 g/dL (ref 30.0–36.0)
MCV: 90.3 fl (ref 78.0–100.0)
Monocytes Absolute: 0.6 10*3/uL (ref 0.1–1.0)
Monocytes Relative: 8.3 % (ref 3.0–12.0)
Neutro Abs: 5.7 10*3/uL (ref 1.4–7.7)
Neutrophils Relative %: 75 % (ref 43.0–77.0)
Platelets: 190 10*3/uL (ref 150.0–400.0)
RBC: 4.47 Mil/uL (ref 4.22–5.81)
RDW: 13.6 % (ref 11.5–15.5)
WBC: 7.6 10*3/uL (ref 4.0–10.5)

## 2017-07-12 LAB — LIPID PANEL
Cholesterol: 165 mg/dL (ref 0–200)
HDL: 37.5 mg/dL — ABNORMAL LOW (ref >39.00)
NonHDL: 127.75
Total CHOL/HDL Ratio: 4
Triglycerides: 298 mg/dL — ABNORMAL HIGH (ref 0.0–149.0)
VLDL: 59.6 mg/dL — ABNORMAL HIGH (ref 0.0–40.0)

## 2017-07-12 LAB — COMPREHENSIVE METABOLIC PANEL
ALT: 28 U/L (ref 0–53)
AST: 22 U/L (ref 0–37)
Albumin: 4.1 g/dL (ref 3.5–5.2)
Alkaline Phosphatase: 66 U/L (ref 39–117)
BUN: 23 mg/dL (ref 6–23)
CO2: 26 mEq/L (ref 19–32)
Calcium: 9.3 mg/dL (ref 8.4–10.5)
Chloride: 102 mEq/L (ref 96–112)
Creatinine, Ser: 1.11 mg/dL (ref 0.40–1.50)
GFR: 72.79 mL/min (ref >60.00)
Glucose, Bld: 101 mg/dL — ABNORMAL HIGH (ref 70–99)
Potassium: 4.2 mEq/L (ref 3.5–5.1)
Sodium: 137 mEq/L (ref 135–145)
Total Bilirubin: 1.1 mg/dL (ref 0.2–1.2)
Total Protein: 6.4 g/dL (ref 6.0–8.3)

## 2017-07-12 LAB — TSH: TSH: 1.35 u[IU]/mL (ref 0.35–4.50)

## 2017-07-12 LAB — LDL CHOLESTEROL, DIRECT: Direct LDL: 107 mg/dL

## 2017-07-12 LAB — HEMOGLOBIN A1C: Hgb A1c MFr Bld: 5.6 % (ref 4.6–6.5)

## 2017-07-12 NOTE — Progress Notes (Signed)
Subjective:    Seth Morales is a 57 y.o. male who presents today for his Complete Annual Exam. He feels well. He reports exercising regularly. He reports he is sleeping well.   Health Maintenance Due  Topic Date Due  . Hepatitis C Screening  12/[birth year]   PMHx, SurgHx, SocialHx, Medications, and Allergies were reviewed in the Visit Navigator and updated as appropriate.   Past Medical History:  Diagnosis Date  . Allergy   . Blood transfusion without reported diagnosis   . Chronic kidney disease   . Hypertension     Past Surgical History:  Procedure Laterality Date  . EYE SURGERY    . INSERTION OF DIALYSIS CATHETER    . KIDNEY TRANSPLANT  2006  . KNEE ARTHROPLASTY Bilateral   . NEPHRECTOMY Bilateral     Family History  Problem Relation Age of Onset  . Arthritis Mother   . Hypertension Mother   . Kidney disease Mother    Social History   Tobacco Use  . Smoking status: Never Smoker  . Smokeless tobacco: Never Used  Substance Use Topics  . Alcohol use: No    Alcohol/week: 0.0 oz  . Drug use: No    Review of Systems:   Pertinent items are noted in the HPI. Otherwise, ROS is negative.  Objective:   Vitals:   07/12/17 1308  BP: 122/68  Pulse: 83  Temp: 98.3 F (36.8 C)  SpO2: 97%   Body mass index is 34.02 kg/m.  General Appearance:  Alert, cooperative, no distress, appears stated age  Head:  Normocephalic, without obvious abnormality, atraumatic  Eyes:  PERRL, conjunctiva/corneas clear, EOM's intact, fundi benign, both eyes       Ears:  Normal TM's and external ear canals, both ears  Nose: Nares normal, septum midline, mucosa normal, no drainage    or sinus tenderness  Throat: Lips, mucosa, and tongue normal; teeth and gums normal  Neck: Supple, symmetrical, trachea midline, no adenopathy; thyroid:  No enlargement/tenderness/nodules; no carotit bruit or JVD  Back:   Symmetric, no curvature, ROM normal, no CVA tenderness  Lungs:   Clear to  auscultation bilaterally, respirations unlabored  Chest wall:  No tenderness or deformity  Heart:  Regular rate and rhythm, S1 and S2 normal, no murmur, rub   or gallop  Abdomen:   Soft, non-tender, bowel sounds active all four quadrants, no masses, no organomegaly  Extremities: Extremities normal, atraumatic, no cyanosis or edema  Prostate: Not done.   Skin: Skin color, texture, turgor normal, no rashes or lesions  Lymph nodes: Cervical, supraclavicular, and axillary nodes normal  Neurologic: CNII-XII grossly intact. Normal strength, sensation and reflexes throughout   Assessment/Plan:   Mclean was seen today for follow-up.  Diagnoses and all orders for this visit:  Routine physical examination  Encounter for hepatitis C screening test for low risk patient -     Hepatitis C antibody  Essential hypertension -     Comprehensive metabolic panel -     Lipid panel  Other fatigue -     CBC with Differential/Platelet -     Comprehensive metabolic panel -     TSH -     Hemoglobin A1c  Screen for colon cancer -     Ambulatory referral to Gastroenterology  Obesity (BMI 30-39.9) -     Hemoglobin A1c  Screening for lipid disorders -     Lipid panel   Patient Counseling:   Nutrition: Stressed importance of moderation in sodium/caffeine  intake, saturated fat and cholesterol, caloric balance, sufficient intake of fresh fruits, vegetables, and fiber.    Stressed the importance of regular exercise.     Substance Abuse: Discussed cessation/primary prevention of tobacco, alcohol, or other drug use; driving or other dangerous activities under the influence; availability of treatment for abuse.     Injury prevention: Discussed safety belts, safety helmets, smoke detector, smoking near bedding or upholstery.     Sexuality: Discussed sexually transmitted diseases, partner selection, use of condoms, avoidance of unintended pregnancy and contraceptive alternatives.     Dental  health: Discussed importance of regular tooth brushing, flossing, and dental visits.    Health maintenance and immunizations reviewed. Please refer to Health maintenance section.    Helane Rima, DO [place] [facility]

## 2017-07-13 LAB — HEPATITIS C ANTIBODY
Hepatitis C Ab: NONREACTIVE
SIGNAL TO CUT-OFF: 0.01 (ref <1.00)

## 2017-09-11 ENCOUNTER — Encounter: Payer: Self-pay | Admitting: Family Medicine

## 2017-10-02 ENCOUNTER — Telehealth: Payer: Self-pay | Admitting: Gastroenterology

## 2017-10-02 ENCOUNTER — Encounter: Payer: Self-pay | Admitting: Family Medicine

## 2017-10-02 ENCOUNTER — Encounter: Payer: Self-pay | Admitting: Internal Medicine

## 2017-10-02 NOTE — Telephone Encounter (Signed)
A user error has taken place: ERROR °

## 2017-10-03 ENCOUNTER — Encounter: Payer: Self-pay | Admitting: Family Medicine

## 2017-10-03 ENCOUNTER — Ambulatory Visit: Payer: BC Managed Care – PPO | Admitting: Family Medicine

## 2017-10-03 VITALS — BP 128/78 | HR 79 | Temp 97.9°F | Ht 78.0 in | Wt 291.4 lb

## 2017-10-03 DIAGNOSIS — R1012 Left upper quadrant pain: Secondary | ICD-10-CM | POA: Diagnosis not present

## 2017-10-03 DIAGNOSIS — K649 Unspecified hemorrhoids: Secondary | ICD-10-CM

## 2017-10-03 MED ORDER — PREDNISONE 5 MG PO TABS
ORAL_TABLET | ORAL | 0 refills | Status: DC
Start: 2017-10-03 — End: 2017-10-16

## 2017-10-03 MED ORDER — LIDOCAINE HCL 2 % EX GEL
1.0000 | CUTANEOUS | 0 refills | Status: DC | PRN
Start: 2017-10-03 — End: 2017-10-16

## 2017-10-03 MED ORDER — HYDROCORTISONE ACETATE 25 MG RE SUPP: 25.0000 mg | Freq: Two times a day (BID) | RECTAL | 0 refills | Status: DC

## 2017-10-03 NOTE — Progress Notes (Signed)
Seth Morales is a 57 y.o. male here for an acute visit.  History of Present Illness:   Seth Morales CMA acting as scribe for Dr. Earlene Morales.  HPI: Patient comes in today for a couple of concerns.   Abdominal pain: Patient is having abdominal pain of the left mid abdomin. He was seen for these symptoms last year. He stated that he thought it was a hernia. He said that a friend told him to rub inward on the area and that will help with the pain. Patient stated that this helps some.   Hemorrhoids: Patient stated that he has hemorrhoids and would like for the doctor to tell him how to help with this. He has has also had a lot of diarrhea over the last week. His mom has E coli and she is a transplant patient as well.   PMHx, SurgHx, SocialHx, Medications, and Allergies were reviewed in the Visit Navigator and updated as appropriate.  Current Medications:   .  allopurinol (ZYLOPRIM) 100 MG tablet, Take 100 mg by mouth daily., Disp: , Rfl:  .  amLODipine (NORVASC) 5 MG tablet, Take 5 mg by mouth daily., Disp: , Rfl:  .  Ascorbic Acid (VITAMIN C) 1000 MG tablet, Take 1,000 mg by mouth daily., Disp: , Rfl:  .  cycloSPORINE (SANDIMMUNE) 100 MG capsule, Take 100 mg by mouth 2 (two) times daily., Disp: , Rfl:  .  cycloSPORINE (SANDIMMUNE) 25 MG capsule, Take 25 mg by mouth 2 (two) times daily., Disp: , Rfl:  .  docusate sodium (COLACE) 100 MG capsule, Take 100 mg by mouth 2 (two) times daily., Disp: , Rfl:  .  ferrous sulfate 325 (65 FE) MG EC tablet, Take 325 mg by mouth 3 (three) times daily with meals., Disp: , Rfl:  .  hydrochlorothiazide (MICROZIDE) 12.5 MG capsule, Take 12.5 mg by mouth every other day., Disp: , Rfl:  .  losartan (COZAAR) 100 MG tablet, Take 100 mg by mouth daily., Disp: , Rfl:  .  magnesium oxide-pyridoxine (BEELITH) 362-20 MG TABS, Take 1 tablet by mouth daily., Disp: , Rfl:  .  Multiple Vitamins-Minerals (MULTIVITAMIN WITH MINERALS) tablet, Take 1 tablet by mouth daily.,  Disp: , Rfl:  .  mycophenolate (CELLCEPT) 250 MG capsule, Take 750 mg by mouth 2 (two) times daily., Disp: , Rfl:  .  predniSONE (DELTASONE) 5 MG tablet, Take 1 tablet (5 mg total) by mouth daily with breakfast., Disp: 90 tablet, Rfl: 2   No Known Allergies   Review of Systems:   Pertinent items are noted in the HPI. Otherwise, ROS is negative.  Vitals:   Vitals:   10/03/17 0934  BP: 128/78  Pulse: 79  Temp: 97.9 F (36.6 C)  TempSrc: Oral  SpO2: 96%  Weight: 291 lb 6.4 oz (132.2 kg)  Height: 6\' 6"  (1.981 m)     Body mass index is 33.67 kg/m.  Physical Exam:   Physical Exam  Constitutional: He is oriented to person, place, and time. He appears well-developed and well-nourished. No distress.  HENT:  Head: Normocephalic and atraumatic.  Right Ear: External ear normal.  Left Ear: External ear normal.  Nose: Nose normal.  Mouth/Throat: Oropharynx is clear and moist.  Eyes: Pupils are equal, round, and reactive to light. Conjunctivae and EOM are normal.  Neck: Normal range of motion. Neck supple.  Cardiovascular: Normal rate, regular rhythm, normal heart sounds and intact distal pulses.  Pulmonary/Chest: Effort normal and breath sounds normal. No respiratory distress.  Abdominal: Soft. Bowel sounds are normal. He exhibits no distension, no abdominal bruit, no pulsatile midline mass and no mass. There is no hepatosplenomegaly. No hernia.  Musculoskeletal: Normal range of motion.  Neurological: He is alert and oriented to person, place, and time.  Skin: Skin is warm and dry.  Psychiatric: He has a normal mood and affect. His behavior is normal. Judgment and thought content normal.  Nursing note and vitals reviewed.  Assessment and Plan:   Diagnoses and all orders for this visit:  Left upper quadrant pain Comments: Previous concern for mild pericarditis and tx with prednisone worked. Will give again. Orders: -     predniSONE (DELTASONE) 5 MG tablet;  6-6-5-5-4-4-3-3-2-2-1-1-0  Hemorrhoids, unspecified hemorrhoid type -     hydrocortisone (ANUSOL-HC) 25 MG suppository; Place 1 suppository (25 mg total) rectally 2 (two) times daily. -     lidocaine (XYLOCAINE) 2 % jelly; Apply 1 application topically as needed.    . Reviewed expectations re: course of current medical issues. . Discussed self-management of symptoms. . Outlined signs and symptoms indicating need for more acute intervention. . Patient verbalized understanding and all questions were answered. Marland Kitchen. Health Maintenance issues including appropriate healthy diet, exercise, and smoking avoidance were discussed with patient. . See orders for this visit as documented in the electronic medical record. . Patient received an After Visit Summary.  CMA served as Neurosurgeonscribe during this visit. History, Physical, and Plan performed by medical provider. The above documentation has been reviewed and is accurate and complete. Helane RimaErica Alicia Ackert, D.O.  Helane RimaErica Chi [place]ham, DO [place], [facility] 10/07/2017

## 2017-10-12 ENCOUNTER — Encounter: Payer: Self-pay | Admitting: Gastroenterology

## 2017-10-13 ENCOUNTER — Encounter: Payer: Self-pay | Admitting: Family Medicine

## 2017-10-15 NOTE — Progress Notes (Signed)
Seth Morales is a 57 y.o. male is here for follow up.  History of Present Illness:   HPI: Pain in left lower chest, left upper abdomen. Continues. Intermittent. Musician. Previously resolved with prednisone taper. Hx of polycystic kidney disease s/p transplant. No cough, SOB, CP, edema.  Health Maintenance Due  Topic Date Due  . INFLUENZA VACCINE  10/05/2017   Depression screen PHQ 2/9 07/12/2017  Down, Depressed, Hopeless 0  PHQ - 2 Score 0  Altered sleeping 0  Tired, decreased energy 0  Change in appetite 0  Feeling bad or failure about yourself  0  Trouble concentrating 0  Moving slowly or fidgety/restless 0  Suicidal thoughts 0  PHQ-9 Score 0  Difficult doing work/chores Not difficult at all   PMHx, SurgHx, SocialHx, FamHx, Medications, and Allergies were reviewed in the Visit Navigator and updated as appropriate.   Patient Active Problem List   Diagnosis Date Noted  . Tear of right hamstring 10/06/2016  . Polycystic kidney disease 11/07/2014  . Essential hypertension 11/07/2014  . 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 Obesity (BMI 30-39.9) 02/21/2013  . Gout 02/02/2011   Social History   Tobacco Use  . Smoking status: Never Smoker  . Smokeless tobacco: Never Used  Substance Use Topics  . Alcohol use: No    Alcohol/week: 0.0 standard drinks  . Drug use: No   Current Medications and Allergies:   .  allopurinol (ZYLOPRIM) 100 MG tablet, Take 100 mg by mouth daily., Disp: , Rfl:  .  amLODipine (NORVASC) 5 MG tablet, Take 5 mg by mouth daily., Disp: , Rfl:  .  Ascorbic Acid (VITAMIN C) 1000 MG tablet, Take 1,000 mg by mouth daily., Disp: , Rfl:  .  cycloSPORINE (SANDIMMUNE) 100 MG capsule, Take 100 mg by mouth 2 (two) times daily., Disp: , Rfl:  .  cycloSPORINE (SANDIMMUNE) 25 MG capsule, Take 25 mg by mouth 2 (two) times daily., Disp: , Rfl:  .  ferrous sulfate 325 (65 FE) MG EC tablet, Take 325 mg by mouth 3 (three) times daily with meals.,  Disp: , Rfl:  .  hydrochlorothiazide (MICROZIDE) 12.5 MG capsule, Take 12.5 mg by mouth every other day., Disp: , Rfl:  .  losartan (COZAAR) 100 MG tablet, Take 100 mg by mouth daily., Disp: , Rfl:  .  magnesium oxide-pyridoxine (BEELITH) 362-20 MG TABS, Take 1 tablet by mouth daily., Disp: , Rfl:  .  Multiple Vitamins-Minerals (MULTIVITAMIN WITH MINERALS) tablet, Take 1 tablet by mouth daily., Disp: , Rfl:  .  mycophenolate (CELLCEPT) 250 MG capsule, Take 750 mg by mouth 2 (two) times daily., Disp: , Rfl:  .  ORACEA 40 MG capsule, Take 1 capsule by mouth daily., Disp: , Rfl:  .  predniSONE (DELTASONE) 5 MG tablet, Take 1 tablet (5 mg total) by mouth daily with breakfast., Disp: 90 tablet, Rfl: 2  No Known Allergies   Review of Systems   Pertinent items are noted in the HPI. Otherwise, ROS is negative.  Vitals:   Vitals:   10/16/17 1142  BP: 132/82  Pulse: 73  Temp: 98.2 F (36.8 C)  TempSrc: Oral  SpO2: 97%  Weight: 292 lb (132.5 kg)  Height: 6\' 6"  (1.981 m)     Body mass index is 33.74 kg/m.  Physical Exam:   Physical Exam  Constitutional: He is oriented to person, place, and time. He appears well-developed and well-nourished. No distress.  HENT:  Head: Normocephalic and atraumatic.  Right Ear:  External ear normal.  Left Ear: External ear normal.  Nose: Nose normal.  Mouth/Throat: Oropharynx is clear and moist.  Eyes: Pupils are equal, round, and reactive to light. Conjunctivae and EOM are normal.  Neck: Normal range of motion. Neck supple.  Cardiovascular: Normal rate, regular rhythm, normal heart sounds and intact distal pulses.  Pulmonary/Chest: Effort normal and breath sounds normal.  Abdominal: Soft. Bowel sounds are normal.    Musculoskeletal: Normal range of motion.  Neurological: He is alert and oriented to person, place, and time.  Skin: Skin is warm and dry.  Psychiatric: He has a normal mood and affect. His behavior is normal. Judgment and thought  content normal.  Nursing note and vitals reviewed.  CXR: WNL  Assessment and Plan:   Viraj was seen today for abdominal pain.  Diagnoses and all orders for this visit:  Atypical chest pain Comments: Unclear etiology. Myofascial v viscerosomatic? Will ask Sports Medicine to help. Orders: -     DG Chest 2 View  Deceased-donor kidney transplant recipient   . Reviewed expectations re: course of current medical issues. . Discussed self-management of symptoms. . Outlined signs and symptoms indicating need for more acute intervention. . Patient verbalized understanding and all questions were answered. Marland Kitchen Health Maintenance issues including appropriate healthy diet, exercise, and smoking avoidance were discussed with patient. . See orders for this visit as documented in the electronic medical record. . Patient received an After Visit Summary.  Helane Rima, DO [place], [facility] 10/30/2017  Future Appointments  Date Time Provider Department Center  11/08/2017  3:30 PM [facility] PREVISIT [id number]  11/21/2017  4:00 PM [place], Eleonore Chiquito, MD [facility]

## 2017-10-16 ENCOUNTER — Ambulatory Visit (INDEPENDENT_AMBULATORY_CARE_PROVIDER_SITE_OTHER): Payer: BC Managed Care – PPO

## 2017-10-16 ENCOUNTER — Encounter: Payer: Self-pay | Admitting: Family Medicine

## 2017-10-16 ENCOUNTER — Ambulatory Visit: Payer: BC Managed Care – PPO | Admitting: Family Medicine

## 2017-10-16 VITALS — BP 132/82 | HR 73 | Temp 98.2°F | Ht 78.0 in | Wt 292.0 lb

## 2017-10-16 DIAGNOSIS — Z940 Kidney transplant status: Secondary | ICD-10-CM

## 2017-10-16 DIAGNOSIS — R0789 Other chest pain: Secondary | ICD-10-CM

## 2017-10-18 ENCOUNTER — Encounter: Payer: Self-pay | Admitting: Family Medicine

## 2017-10-20 ENCOUNTER — Other Ambulatory Visit: Payer: Self-pay | Admitting: Surgical

## 2017-10-20 DIAGNOSIS — R0789 Other chest pain: Secondary | ICD-10-CM

## 2017-10-30 ENCOUNTER — Encounter: Payer: Self-pay | Admitting: Family Medicine

## 2017-11-08 ENCOUNTER — Ambulatory Visit (AMBULATORY_SURGERY_CENTER): Payer: Self-pay | Admitting: *Deleted

## 2017-11-08 ENCOUNTER — Encounter: Payer: Self-pay | Admitting: Gastroenterology

## 2017-11-08 VITALS — Ht 78.0 in | Wt 292.0 lb

## 2017-11-08 DIAGNOSIS — Z1211 Encounter for screening for malignant neoplasm of colon: Secondary | ICD-10-CM

## 2017-11-08 MED ORDER — NA SULFATE-K SULFATE-MG SULF 17.5-3.13-1.6 GM/177ML PO SOLN: ORAL | 0 refills | Status: DC

## 2017-11-08 NOTE — Progress Notes (Signed)
Patient denies any allergies to eggs or soy. Patient denies any problems with anesthesia/sedation. Patient denies any oxygen use at home. Patient denies taking any diet/weight loss medications or blood thinners. EMMI education offered, pt declined. suprep coupon given to pt.

## 2017-11-21 ENCOUNTER — Ambulatory Visit (AMBULATORY_SURGERY_CENTER): Payer: BC Managed Care – PPO | Admitting: Gastroenterology

## 2017-11-21 ENCOUNTER — Encounter: Payer: Self-pay | Admitting: Gastroenterology

## 2017-11-21 VITALS — BP 128/69 | HR 68 | Temp 99.3°F | Resp 20

## 2017-11-21 DIAGNOSIS — Z538 Procedure and treatment not carried out for other reasons: Secondary | ICD-10-CM | POA: Diagnosis not present

## 2017-11-21 DIAGNOSIS — Z1211 Encounter for screening for malignant neoplasm of colon: Secondary | ICD-10-CM

## 2017-11-21 MED ORDER — SODIUM CHLORIDE 0.9 % IV SOLN: 500.0000 mL | Freq: Once | INTRAVENOUS | Status: DC

## 2017-11-21 NOTE — Progress Notes (Addendum)
Patient prefer regular Colonoscopy with a double prep. Due to patient's work schedule he would like to have this during his christmas break in December. Wife and pt understands to call us back in couple of weeks to schedule because our December appointment are not available at this time.

## 2017-11-21 NOTE — Progress Notes (Signed)
Pt's states no medical or surgical changes since previsit or office visit. 

## 2017-11-21 NOTE — Op Note (Signed)
[facility] Patient Name: Seth Morales Procedure Date: 11/21/2017 4:05 PM MRN: [id number] Endoscopist: Napoleon Form , MD Age: 57 Referring MD:  Date of Birth: 05-11-1960 Gender: Male Account #: [id number] Procedure:                Colonoscopy Indications:              Screening for colorectal malignant neoplasm.                            Previous colonoscopy in 2014 incomplete at [facility], couldnt extend scope beyond proximal                            ascending colon. Medicines:                Monitored Anesthesia Care Procedure:                Pre-Anesthesia Assessment:                           - Prior to the procedure, a History and Physical                            was performed, and patient medications and                            allergies were reviewed. The patient's tolerance of                            previous anesthesia was also reviewed. The risks                            and benefits of the procedure and the sedation                            options and risks were discussed with the patient.                            All questions were answered, and informed consent                            was obtained. Prior Anticoagulants: The patient has                            taken no previous anticoagulant or antiplatelet                            agents. ASA Grade Assessment: II - A patient with                            mild systemic disease. After reviewing the risks  and benefits, the patient was deemed in                            satisfactory condition to undergo the procedure.                           After obtaining informed consent, the colonoscope                            was passed under direct vision. Throughout the                            procedure, the patient's blood pressure, pulse, and                            oxygen saturations were monitored continuously. The                             Colonoscope was introduced through the anus and                            advanced to the the ascending colon. The                            colonoscopy was performed with difficulty due to                            inadequate bowel prep, a redundant colon, a                            tortuous colon and the patient's body habitus.                            Successful completion of the procedure was aided by                            applying abdominal pressure and lavage. The patient                            tolerated the procedure well. The quality of the                            bowel preparation was poor. The ileocecal valve and                            the rectum were photographed. Scope In: 4:12:19 PM Scope Out: 4:51:55 PM Total Procedure Duration: 0 hours 39 minutes 36 seconds  Findings:                 The perianal and digital rectal examinations were                            normal.  The transverse colon was significantly redundant.                           A moderate amount of stool was found in the                            transverse colon and in the ascending colon,                            interfering with visualization.                           Non-bleeding internal hemorrhoids were found during                            retroflexion. The hemorrhoids were large.                           Scattered small and large-mouthed diverticula were                            found in the sigmoid colon and ascending colon. Complications:            No immediate complications. Estimated Blood Loss:     Estimated blood loss: none. Impression:               - Preparation of the colon was poor.                           - Redundant colon.                           - Stool in the transverse colon and in the                            ascending colon.                           - Non-bleeding internal hemorrhoids.                            - No specimens collected. Recommendation:           - Patient has a contact number available for                            emergencies. The signs and symptoms of potential                            delayed complications were discussed with the                            patient. Return to normal activities tomorrow.                            Written discharge instructions were provided to the  patient.                           - Resume previous diet.                           - Continue present medications.                           - Schedule for virtual colonoscopy (CT colography)                            for colorectal cancer screening.                           - The patient will require an extended preparation.                            If there are any questions, please contact the                            gastroenterologist. Napoleon Form, MD 11/21/2017 4:59:49 PM This report has been signed electronically.

## 2017-11-21 NOTE — Patient Instructions (Signed)
Discharge instructions given. Handouts on Diverticulosis and Hemorrhoids. Resume previous medications. Office will schedule for Virtual Colonoscopy (CT COLOGRAPHY). PATIENT WILL REQUIRE AN EXTENDED PREPARATION. YOU HAD AN ENDOSCOPIC PROCEDURE TODAY AT THE [facility]:   Refer to the procedure report that was given to you for any specific questions about what was found during the examination.  If the procedure report does not answer your questions, please call your gastroenterologist to clarify.  If you requested that your care partner not be given the details of your procedure findings, then the procedure report has been included in a sealed envelope for you to review at your convenience later.  YOU SHOULD EXPECT: Some feelings of bloating in the abdomen. Passage of more gas than usual.  Walking can help get rid of the air that was put into your GI tract during the procedure and reduce the bloating. If you had a lower endoscopy (such as a colonoscopy or flexible sigmoidoscopy) you may notice spotting of blood in your stool or on the toilet paper. If you underwent a bowel prep for your procedure, you may not have a normal bowel movement for a few days.  Please Note:  You might notice some irritation and congestion in your nose or some drainage.  This is from the oxygen used during your procedure.  There is no need for concern and it should clear up in a day or so.  SYMPTOMS TO REPORT IMMEDIATELY:   Following lower endoscopy (colonoscopy or flexible sigmoidoscopy):  Excessive amounts of blood in the stool  Significant tenderness or worsening of abdominal pains  Swelling of the abdomen that is new, acute  Fever of 100F or higher   For urgent or emergent issues, a gastroenterologist can be reached at any hour by calling [phone].   DIET:  We do recommend a small meal at first, but then you may proceed to your regular diet.  Drink plenty of fluids but you should avoid alcoholic  beverages for 24 hours.  ACTIVITY:  You should plan to take it easy for the rest of today and you should NOT DRIVE or use heavy machinery until tomorrow (because of the sedation medicines used during the test).    FOLLOW UP: Our staff will call the number listed on your records the next business day following your procedure to check on you and address any questions or concerns that you may have regarding the information given to you following your procedure. If we do not reach you, we will leave a message.  However, if you are feeling well and you are not experiencing any problems, there is no need to return our call.  We will assume that you have returned to your regular daily activities without incident.  If any biopsies were taken you will be contacted by phone or by letter within the next 1-3 weeks.  Please call us at [phone] if you have not heard about the biopsies in 3 weeks.    SIGNATURES/CONFIDENTIALITY: You and/or your care partner have signed paperwork which will be entered into your electronic medical record.  These signatures attest to the fact that that the information above on your After Visit Summary has been reviewed and is understood.  Full responsibility of the confidentiality of this discharge information lies with you and/or your care-partner.

## 2017-11-21 NOTE — Progress Notes (Signed)
A/ox3 pleased with MAC, report to RN 

## 2017-11-22 ENCOUNTER — Telehealth: Payer: Self-pay

## 2017-11-22 NOTE — Telephone Encounter (Signed)
  Follow up Call-  Call back number 11/21/2017  Post procedure Call Back phone  # [phone]  Permission to leave phone message Yes  Some recent data might be hidden     Patient questions:  Do you have a fever, pain , or abdominal swelling? No. Pain Score  0 *  Have you tolerated food without any problems? Yes.    Have you been able to return to your normal activities? Yes.    Do you have any questions about your discharge instructions: Diet   No. Medications  No. Follow up visit  No.  Do you have questions or concerns about your Care? No.  Actions: * If pain score is 4 or above: No action needed, pain <4.

## 2018-01-02 ENCOUNTER — Encounter: Payer: Self-pay | Admitting: Family Medicine

## 2018-01-03 ENCOUNTER — Other Ambulatory Visit: Payer: Self-pay | Admitting: Family Medicine

## 2018-01-03 MED ORDER — NYSTATIN 100000 UNIT/ML MT SUSP: 5.0000 mL | Freq: Four times a day (QID) | OROMUCOSAL | 0 refills | Status: DC

## 2018-01-03 MED ORDER — ALBUTEROL SULFATE HFA 108 (90 BASE) MCG/ACT IN AERS: 2.0000 | INHALATION_SPRAY | Freq: Four times a day (QID) | RESPIRATORY_TRACT | 0 refills | Status: DC | PRN

## 2018-01-24 ENCOUNTER — Other Ambulatory Visit: Payer: Self-pay | Admitting: Family Medicine

## 2018-02-22 ENCOUNTER — Encounter: Payer: Self-pay | Admitting: Family Medicine

## 2018-02-22 ENCOUNTER — Ambulatory Visit: Payer: BC Managed Care – PPO | Admitting: Family Medicine

## 2018-02-22 VITALS — BP 138/70 | HR 63 | Temp 98.3°F | Ht 78.0 in | Wt 298.0 lb

## 2018-02-22 DIAGNOSIS — J0100 Acute maxillary sinusitis, unspecified: Secondary | ICD-10-CM | POA: Diagnosis not present

## 2018-02-22 NOTE — Progress Notes (Signed)
Seth Morales is a 57 y.o. male  No chief complaint on file.   HPI: ALISHA BURGO is a 57 y.o. male complains of 3 day h/o occasionally productive but mostly dry cough. + PND.  Temp of 99.1. Body aches x 1 day.  Today pt notes nasal congestion. No rhinorrhea. He states he eyes "burn" and his face feels "pressurized". + dull headache x 2 days.  No ear pain/fullness. No chills. No sore throat.  Pt is able to sleep at night.  He has been taking Mucinex, tylenol (last dose yesterday) and other OTC med.  No known sick contacts. 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  Pt is s/p kidney transplant.  Past Medical History:  Diagnosis Date  . Allergy   . Blood transfusion without reported diagnosis 13 years ago   . Chronic kidney disease   . Hypertension     Past Surgical History:  Procedure Laterality Date  . COLONOSCOPY  09/06/2012   at [place]  . EYE SURGERY    . INSERTION OF DIALYSIS CATHETER    . KIDNEY TRANSPLANT  2006  . KNEE ARTHROPLASTY Bilateral   . NEPHRECTOMY Bilateral     Social History   Socioeconomic History  . Marital status: Married    Spouse name: Not on file  . Number of children: Not on file  . Years of education: Not on file  . Highest education level: Not on file  Occupational History  . Not on file  Social Needs  . Financial resource strain: Not on file  . Food insecurity:    Worry: Not on file    Inability: Not on file  . Transportation needs:    Medical: Not on file    Non-medical: Not on file  Tobacco Use  . Smoking status: Never Smoker  . Smokeless tobacco: Never Used  Substance and Sexual Activity  . Alcohol use: Yes    Alcohol/week: 2.0 standard drinks    Types: 2 Standard drinks or equivalent per week  . Drug use: No  . Sexual activity: Not on file  Lifestyle  . Physical activity:    Days per week: Not on file    Minutes per session: Not on file  . Stress: Not on file  Relationships  .  Social connections:    Talks on phone: Not on file    Gets together: Not on file    Attends religious service: Not on file    Active member of club or organization: Not on file    Attends meetings of clubs or organizations: Not on file    Relationship status: Not on file  . Intimate partner violence:    Fear of current or ex partner: Not on file    Emotionally abused: Not on file    Physically abused: Not on file    Forced sexual activity: Not on file  Other Topics Concern  . Not on file  Social History Narrative  . Not on file    Family History  Problem Relation Age of Onset  . Arthritis Mother   . Hypertension Mother   . Kidney disease Mother   . Colon cancer Neg Hx   . Colon polyps Neg Hx   . Esophageal cancer Neg Hx   . Rectal cancer Neg Hx   . Stomach cancer Neg Hx      Immunization History  Administered Date(s) Administered  . Influenza,inj,Quad PF,6+ Mos  12/09/2015, 01/11/2017  . Influenza,trivalent, recombinat, inj, PF 02/02/2011  . Influenza-Unspecified 01/11/2015  . Tdap 03/08/2011, 10/19/2017    Outpatient Encounter Medications as of 02/22/2018  Medication Sig  . allopurinol (ZYLOPRIM) 100 MG tablet Take 100 mg by mouth daily.  Marland Kitchen. amLODipine (NORVASC) 5 MG tablet Take 5 mg by mouth daily.  . cycloSPORINE (SANDIMMUNE) 100 MG capsule Take 100 mg by mouth 2 (two) times daily.  . cycloSPORINE (SANDIMMUNE) 25 MG capsule Take 25 mg by mouth 2 (two) times daily.  . hydrochlorothiazide (MICROZIDE) 12.5 MG capsule Take 12.5 mg by mouth every other day.  . losartan (COZAAR) 100 MG tablet Take 100 mg by mouth daily.  . magnesium oxide-pyridoxine (BEELITH) 362-20 MG TABS Take 1 tablet by mouth daily.  . Multiple Vitamins-Minerals (MULTIVITAMIN WITH MINERALS) tablet Take 1 tablet by mouth daily.  . mycophenolate (CELLCEPT) 250 MG capsule Take 750 mg by mouth 2 (two) times daily.  Marland Kitchen. nystatin (MYCOSTATIN) 100000 UNIT/ML suspension Take 5 mLs (500,000 Units total) by mouth  4 (four) times daily.  . predniSONE (DELTASONE) 5 MG tablet Take 1 tablet (5 mg total) by mouth daily with breakfast.  . [DISCONTINUED] albuterol (PROVENTIL HFA;VENTOLIN HFA) 108 (90 Base) MCG/ACT inhaler TAKE 2 PUFFS BY MOUTH EVERY 6 HOURS AS NEEDED FOR WHEEZE OR SHORTNESS OF BREATH  . [DISCONTINUED] Ascorbic Acid (VITAMIN C) 1000 MG tablet Take 1,000 mg by mouth daily.  . [DISCONTINUED] ferrous sulfate 325 (65 FE) MG EC tablet Take 325 mg by mouth 3 (three) times daily with meals.  . [DISCONTINUED] Na Sulfate-K Sulfate-Mg Sulf 17.5-3.13-1.6 GM/177ML SOLN Suprep (no substitutions)-TAKE AS DIRECTED.  . [DISCONTINUED] ORACEA 40 MG capsule Take 1 capsule by mouth daily.   No facility-administered encounter medications on file as of 02/22/2018.      ROS: Pertinent positives and negatives noted in HPI    No Known Allergies  BP 138/70   Pulse 63   Temp 98.3 F (36.8 C) (Oral)   Ht 6\' 6"  (1.981 m)   Wt 298 lb (135.2 kg)   SpO2 94%   BMI 34.44 kg/m   Physical Exam  Constitutional: He is oriented to person, place, and time. He appears well-developed and well-nourished. No distress.  HENT:  Head: Normocephalic and atraumatic.  Right Ear: Tympanic membrane and ear canal normal.  Left Ear: Tympanic membrane and ear canal normal.  Nose: Mucosal edema present. No rhinorrhea. Right sinus exhibits maxillary sinus tenderness. Right sinus exhibits no frontal sinus tenderness. Left sinus exhibits maxillary sinus tenderness. Left sinus exhibits no frontal sinus tenderness.  Mouth/Throat: Mucous membranes are normal. Posterior oropharyngeal erythema present. No oropharyngeal exudate or posterior oropharyngeal edema.  Eyes: Pupils are equal, round, and reactive to light. Conjunctivae and EOM are normal. Right eye exhibits no discharge. Left eye exhibits no discharge.  Neck: Neck supple.  Cardiovascular: Normal rate, regular rhythm and normal heart sounds.  Pulmonary/Chest: Effort normal and breath  sounds normal. No stridor. No respiratory distress. He has no wheezes.  Lymphadenopathy:    He has no cervical adenopathy.  Neurological: He is alert and oriented to person, place, and time.     A/P:  1. Acute non-recurrent maxillary sinusitis - cont supportive care to include increased water intake, rest, nasal saline spray, Vics - Flonase daily - Mucinex D BID x 4-5 days - tylenol PRN - f/u if symptoms worsen or do not improve in 1 week Discussed plan and reviewed medications with patient, including risks, benefits, and potential side effects. Pt  expressed understand. All questions answered.

## 2018-02-22 NOTE — Patient Instructions (Signed)
Drink plenty of fluids, especially water Rest Use nasal saline spray at least 3 times per day or you can use neti pot Use humidifier at night Try Mucinex D 1 tab twice per day Try flonase 2 sprays each nostril daily Follow-up if symptoms worsen or do not improve in 7-10 days

## 2018-03-13 ENCOUNTER — Ambulatory Visit: Payer: BC Managed Care – PPO | Admitting: Family Medicine

## 2018-05-08 ENCOUNTER — Telehealth: Payer: Self-pay | Admitting: Family Medicine

## 2018-05-08 ENCOUNTER — Encounter: Payer: Self-pay | Admitting: Family Medicine

## 2018-05-08 NOTE — Telephone Encounter (Signed)
See note, only high priority due to possible medication needed due to spouse with flu  Copied from CRM [id number]. Topic: General - Other >> May 08, 2018  3:37 PM Mcneil, Ja-Kwan wrote: Reason for CRM: Pt stated his wife was just diagnosed with the flu and he was told to contact his pcp for on what he should do. Pt stated he is a transplant patient and would like a call back to advise what he should do. Cb# [phone]

## 2018-05-08 NOTE — Telephone Encounter (Signed)
Please advise ok to send in have not seen in office in while?

## 2018-05-09 ENCOUNTER — Other Ambulatory Visit: Payer: Self-pay

## 2018-05-09 MED ORDER — OSELTAMIVIR PHOSPHATE 75 MG PO CAPS: 75.0000 mg | ORAL_CAPSULE | Freq: Every day | ORAL | 0 refills | Status: DC

## 2018-05-09 NOTE — Telephone Encounter (Signed)
Tamiflu 75 mg po daily x 5 days, #5. Please call in and tell him to call if he develops symptoms since would need BID dosing.

## 2018-05-09 NOTE — Telephone Encounter (Signed)
See note

## 2018-05-09 NOTE — Telephone Encounter (Signed)
Called patient gave information will call if any changes on symptoms.

## 2018-05-09 NOTE — Telephone Encounter (Addendum)
°  Relation to pt: self Call back number: [phone] Pharmacy: [id number] - [address], [place] - [address]  [address] [place] [address]  Phone: [phone] Fax: [phone]  Not a 24 hour pharmacy; exact hours not known.     Reason for call:  Patient checking on the status of PCP recommendation mentioned below, patient unsure if he can take Tamiflu since his a kidney transplant patient,  please advise

## 2018-05-29 ENCOUNTER — Ambulatory Visit (INDEPENDENT_AMBULATORY_CARE_PROVIDER_SITE_OTHER): Payer: BC Managed Care – PPO | Admitting: Family Medicine

## 2018-05-29 ENCOUNTER — Encounter: Payer: Self-pay | Admitting: Family Medicine

## 2018-05-29 ENCOUNTER — Other Ambulatory Visit: Payer: Self-pay

## 2018-05-29 DIAGNOSIS — R05 Cough: Secondary | ICD-10-CM | POA: Diagnosis not present

## 2018-05-29 DIAGNOSIS — R059 Cough, unspecified: Secondary | ICD-10-CM

## 2018-05-29 MED ORDER — PREDNISONE 5 MG PO TABS: ORAL_TABLET | ORAL | 0 refills | Status: DC

## 2018-05-29 MED ORDER — AZITHROMYCIN 250 MG PO TABS: ORAL_TABLET | ORAL | 0 refills | Status: DC

## 2018-05-29 NOTE — Progress Notes (Signed)
Virtual Visit via Video   I connected with PETRONILO LAYMAN on 05/29/18 at  2:00 PM EDT by a video enabled telemedicine application and verified that I am speaking with the correct person using two identifiers. Location patient: Home Location provider: [facility], Office Persons participating in the virtual visit: KASHUS BETHELL, Helane Rima, DO, Barnie Mort as CMA scribe.   I discussed the limitations of evaluation and management by telemedicine and the availability of in person appointments. The patient expressed understanding and agreed to proceed.  Subjective:   HPI: Cough, chest congestion, with wheeze. Some sinus pressure. No fever. Immunocompromised. Nonsmoker.   ROS: See pertinent positives and negatives per HPI.  Patient Active Problem List   Diagnosis Date Noted  . Tear of right hamstring 10/06/2016  . Polycystic kidney disease 12-03-14  . Essential hypertension Dec 03, 2014  . Deceased-donor kidney transplant recipient 12-03-2014  . Obesity (BMI 30-39.9) 02/21/2013  . Gout 02/02/2011    Social History   Tobacco Use  . Smoking status: Never Smoker  . Smokeless tobacco: Never Used  Substance Use Topics  . Alcohol use: Yes    Alcohol/week: 2.0 standard drinks    Types: 2 Standard drinks or equivalent per week   Current Outpatient Medications:  .  allopurinol (ZYLOPRIM) 100 MG tablet, Take 100 mg by mouth daily., Disp: , Rfl:  .  amLODipine (NORVASC) 5 MG tablet, Take 5 mg by mouth daily., Disp: , Rfl:  .  cycloSPORINE (SANDIMMUNE) 100 MG capsule, Take 100 mg by mouth 2 (two) times daily., Disp: , Rfl:  .  cycloSPORINE (SANDIMMUNE) 25 MG capsule, Take 25 mg by mouth 2 (two) times daily., Disp: , Rfl:  .  hydrochlorothiazide (MICROZIDE) 12.5 MG capsule, Take 12.5 mg by mouth every other day., Disp: , Rfl:  .  losartan (COZAAR) 100 MG tablet, Take 100 mg by mouth daily., Disp: , Rfl:  .  magnesium oxide-pyridoxine (BEELITH) 362-20 MG TABS, Take 1 tablet by  mouth daily., Disp: , Rfl:  .  Multiple Vitamins-Minerals (MULTIVITAMIN WITH MINERALS) tablet, Take 1 tablet by mouth daily., Disp: , Rfl:  .  mycophenolate (CELLCEPT) 250 MG capsule, Take 750 mg by mouth 2 (two) times daily., Disp: , Rfl:   No Known Allergies  Objective:   VITALS: Per patient if applicable, see vitals. GENERAL: Alert, appears well and in no acute distress. HEENT: Atraumatic, conjunctiva clear, no obvious abnormalities on inspection of external nose and ears. NECK: Normal movements of the head and neck. CARDIOPULMONARY: No increased WOB. Speaking in clear sentences. I:E ratio WNL.  MS: Moves all visible extremities without noticeable abnormality. PSYCH: Pleasant and cooperative, well-groomed. Speech normal rate and rhythm. Affect is appropriate. Insight and judgement are appropriate. Attention is focused, linear, and appropriate.  NEURO: CN grossly intact. Oriented as arrived to appointment on time with no prompting. Moves both UE equally.  SKIN: No obvious lesions, wounds, erythema, or cyanosis noted on face or hands.  Assessment and Plan:   Diagnoses and all orders for this visit:  Cough -     azithromycin (ZITHROMAX) 250 MG tablet; 2 po day one, then one po each day until gone. -     predniSONE (DELTASONE) 5 MG tablet; 6-5-4-3-2-1-off   . Reviewed expectations re: course of current medical issues. . Discussed self-management of symptoms. . Outlined signs and symptoms indicating need for more acute intervention. . Patient verbalized understanding and all questions were answered. Marland Kitchen Health Maintenance issues including appropriate healthy diet, exercise, and  smoking avoidance were discussed with patient. . See orders for this visit as documented in the electronic medical record.  Helane Rima, DO 05/29/2018

## 2018-11-29 NOTE — Progress Notes (Signed)
Subjective:    Seth Morales is a 58 y.o. male who presents today for his Complete Annual Exam.   Current Outpatient Medications:  .  allopurinol (ZYLOPRIM) 100 MG tablet, Take 100 mg by mouth daily., Disp: , Rfl:  .  amLODipine (NORVASC) 5 MG tablet, Take 5 mg by mouth daily., Disp: , Rfl:  .  cycloSPORINE (SANDIMMUNE) 100 MG capsule, Take 100 mg by mouth 2 (two) times daily., Disp: , Rfl:  .  cycloSPORINE (SANDIMMUNE) 25 MG capsule, Take 25 mg by mouth 2 (two) times daily., Disp: , Rfl:  .  hydrochlorothiazide (MICROZIDE) 12.5 MG capsule, Take 12.5 mg by mouth every other day., Disp: , Rfl:  .  losartan (COZAAR) 100 MG tablet, Take 100 mg by mouth daily., Disp: , Rfl:  .  magnesium oxide-pyridoxine (BEELITH) 362-20 MG TABS, Take 1 tablet by mouth daily., Disp: , Rfl:  .  Multiple Vitamins-Minerals (MULTIVITAMIN WITH MINERALS) tablet, Take 1 tablet by mouth daily., Disp: , Rfl:  .  predniSONE (DELTASONE) 5 MG tablet, Take 1 tablet (5 mg total) by mouth daily with breakfast., Disp: 90 tablet, Rfl: 2 .  azithromycin (ZITHROMAX) 250 MG tablet, 2 po day one, then one po each day until gone., Disp: 6 tablet, Rfl: 0 .  clobetasol (OLUX) 0.05 % topical foam, Apply topically 2 (two) times daily., Disp: 50 g, Rfl: 0 .  cycloSPORINE modified (NEORAL) 100 MG capsule, , Disp: , Rfl:  .  cycloSPORINE modified (NEORAL) 25 MG capsule, , Disp: , Rfl:  .  ketoconazole (NIZORAL) 2 % cream, , Disp: , Rfl:  .  metroNIDAZOLE (METROCREAM) 0.75 % cream, , Disp: , Rfl:  .  Multiple Vitamin (MULTI-VITAMIN) tablet, Take by mouth., Disp: , Rfl:  .  Sulfacetamide Sodium-Sulfur 8-4 % SUSP, , Disp: , Rfl:   There are no preventive care reminders to display for this patient.  PMHx, SurgHx, SocialHx, Medications, and Allergies were reviewed in the Visit Navigator and updated as appropriate.   Past Medical History:  Diagnosis Date  . Allergy   . Blood transfusion without reported diagnosis 13 years ago   .  Chronic kidney disease   . Hypertension      Past Surgical History:  Procedure Laterality Date  . COLONOSCOPY  09/06/2012   at [place]  . EYE SURGERY    . INSERTION OF DIALYSIS CATHETER    . KIDNEY TRANSPLANT  2006  . KNEE ARTHROPLASTY Bilateral   . NEPHRECTOMY Bilateral      Family History  Problem Relation Age of Onset  . Arthritis Mother   . Hypertension Mother   . Kidney disease Mother   . Colon cancer Neg Hx   . Colon polyps Neg Hx   . Esophageal cancer Neg Hx   . Rectal cancer Neg Hx   . Stomach cancer Neg Hx     Social History   Tobacco Use  . Smoking status: Never Smoker  . Smokeless tobacco: Never Used  Substance Use Topics  . Alcohol use: Yes    Alcohol/week: 2.0 standard drinks    Types: 2 Standard drinks or equivalent per week  . Drug use: No    Review of Systems:   Pertinent items are noted in the HPI. Otherwise, ROS is negative.  Objective:    Vitals:   11/30/18 1419  BP: (!) 158/80  Pulse: 75  Temp: 98.5 F (36.9 C)  SpO2: 98%   Body mass index is 34.09 kg/m.  General  Alert, cooperative, no distress, appears stated age  Head:  Normocephalic, without obvious abnormality, atraumatic  Eyes:  PERRL, conjunctiva/corneas clear, EOM's intact, fundi benign, both eyes       Ears:  Normal TM's and external ear canals, both ears  Nose: Nares normal, septum midline, mucosa normal, no drainage or sinus tenderness  Throat: Lips, mucosa, and tongue normal; teeth and gums normal  Neck: Supple, symmetrical, trachea midline, no adenopathy; thyroid: no enlargement/tenderness/nodules; no carotid bruit or JVD  Back:   Symmetric, no curvature, ROM normal, no CVA tenderness  Lungs:   Clear to auscultation bilaterally, respirations unlabored  Chest Wall:  No tenderness or deformity  Heart:  Regular rate and rhythm, S1 and S2 normal, no murmur, rub or gallop  Abdomen:   Soft, non-tender, bowel sounds active all four quadrants, no masses, no  organomegaly  Extremities: Extremities normal, atraumatic, no cyanosis or edema  Prostate: Not done   Skin: Skin color, texture, turgor normal, no rashes or lesions  Lymph: Cervical, supraclavicular, and axillary nodes normal  Neurologic: CNII-XII grossly intact. Normal strength, sensation and reflexes throughout   AssessmentPlan:   Gram was seen today for annual exam.  Diagnoses and all orders for this visit:  Routine physical examination  Vitamin D deficiency -     VITAMIN D 25 Hydroxy (Vit-D Deficiency, Fractures); Future  Pure hypercholesterolemia -     Lipid panel; Future  Fatigue, unspecified type -     Vitamin B12; Future  Need for immunization against influenza -     Flu Vaccine QUAD 36+ mos IM  Polycystic kidney disease  Deceased-donor kidney transplant recipient Comments: Doing well. Followed closely by Urology. Cyclosporin decreased recently (for the first time in over 10 years).   Seborrheic dermatitis of scalp and face Comments: Followed by Dermatology, Dr. Renda Rolls. Orders: -     clobetasol (OLUX) 0.05 % topical foam; Apply topically 2 (two) times daily.  Medication management -     Magnesium; Future -     Comprehensive metabolic panel; Future -     CBC with Differential/Platelet; Future  Obesity (BMI 30-39.9)  Fasting hyperglycemia -     Hemoglobin A1c; Future  Patient Counseling: [x]   Nutrition: Stressed importance of moderation in sodium/caffeine intake, saturated fat and cholesterol, caloric balance, sufficient intake of fresh fruits, vegetables, and fiber  [x]   Stressed the importance of regular exercise.   []   Substance Abuse: Discussed cessation/primary prevention of tobacco, alcohol, or other drug use; driving or other dangerous activities under the influence; availability of treatment for abuse.   [x]   Injury prevention: Discussed safety belts, safety helmets, smoke detector, smoking near bedding or upholstery.   []   Sexuality:  Discussed sexually transmitted diseases, partner selection, use of condoms, avoidance of unintended pregnancy  and contraceptive alternatives.   [x]   Dental health: Discussed importance of regular tooth brushing, flossing, and dental visits.  [x]   Health maintenance and immunizations reviewed. Please refer to Health maintenance section.   Briscoe Deutscher, DO [place]

## 2018-11-30 ENCOUNTER — Encounter: Payer: Self-pay | Admitting: Family Medicine

## 2018-11-30 ENCOUNTER — Ambulatory Visit (INDEPENDENT_AMBULATORY_CARE_PROVIDER_SITE_OTHER): Payer: BC Managed Care – PPO | Admitting: Family Medicine

## 2018-11-30 ENCOUNTER — Other Ambulatory Visit: Payer: Self-pay

## 2018-11-30 VITALS — BP 158/80 | HR 75 | Temp 98.5°F | Ht 78.0 in | Wt 295.0 lb

## 2018-11-30 DIAGNOSIS — Z0000 Encounter for general adult medical examination without abnormal findings: Secondary | ICD-10-CM | POA: Diagnosis not present

## 2018-11-30 DIAGNOSIS — Z940 Kidney transplant status: Secondary | ICD-10-CM

## 2018-11-30 DIAGNOSIS — L219 Seborrheic dermatitis, unspecified: Secondary | ICD-10-CM

## 2018-11-30 DIAGNOSIS — E559 Vitamin D deficiency, unspecified: Secondary | ICD-10-CM

## 2018-11-30 DIAGNOSIS — Z79899 Other long term (current) drug therapy: Secondary | ICD-10-CM

## 2018-11-30 DIAGNOSIS — Q613 Polycystic kidney, unspecified: Secondary | ICD-10-CM

## 2018-11-30 DIAGNOSIS — E669 Obesity, unspecified: Secondary | ICD-10-CM

## 2018-11-30 DIAGNOSIS — R5383 Other fatigue: Secondary | ICD-10-CM

## 2018-11-30 DIAGNOSIS — E7800 Pure hypercholesterolemia, unspecified: Secondary | ICD-10-CM | POA: Diagnosis not present

## 2018-11-30 DIAGNOSIS — Z23 Encounter for immunization: Secondary | ICD-10-CM

## 2018-11-30 DIAGNOSIS — R7301 Impaired fasting glucose: Secondary | ICD-10-CM

## 2018-11-30 MED ORDER — CLOBETASOL PROPIONATE 0.05 % EX FOAM: Freq: Two times a day (BID) | CUTANEOUS | 0 refills | Status: DC

## 2018-12-01 LAB — CBC WITH DIFFERENTIAL/PLATELET
Absolute Monocytes: 542 cells/uL (ref 200–950)
Basophils Absolute: 43 cells/uL (ref 0–200)
Basophils Relative: 0.5 %
Eosinophils Absolute: 43 cells/uL (ref 15–500)
Eosinophils Relative: 0.5 %
HCT: 40.2 % (ref 38.5–50.0)
Hemoglobin: 13.9 g/dL (ref 13.2–17.1)
Lymphs Abs: 1505 cells/uL (ref 850–3900)
MCH: 31.2 pg (ref 27.0–33.0)
MCHC: 34.6 g/dL (ref 32.0–36.0)
MCV: 90.1 fL (ref 80.0–100.0)
MPV: 11.1 fL (ref 7.5–12.5)
Monocytes Relative: 6.3 %
Neutro Abs: 6467 cells/uL (ref 1500–7800)
Neutrophils Relative %: 75.2 %
Platelets: 233 10*3/uL (ref 140–400)
RBC: 4.46 10*6/uL (ref 4.20–5.80)
RDW: 12.7 % (ref 11.0–15.0)
Total Lymphocyte: 17.5 %
WBC: 8.6 10*3/uL (ref 3.8–10.8)

## 2018-12-01 LAB — LIPID PANEL
Cholesterol: 181 mg/dL (ref <200)
HDL: 37 mg/dL — ABNORMAL LOW (ref >=40)
Non-HDL Cholesterol (Calc): 144 mg/dL (calc) — ABNORMAL HIGH (ref <130)
Total CHOL/HDL Ratio: 4.9 (calc) (ref <5.0)
Triglycerides: 412 mg/dL — ABNORMAL HIGH (ref <150)

## 2018-12-01 LAB — COMPREHENSIVE METABOLIC PANEL
AG Ratio: 2 (calc) (ref 1.0–2.5)
ALT: 29 U/L (ref 9–46)
AST: 23 U/L (ref 10–35)
Albumin: 4.2 g/dL (ref 3.6–5.1)
Alkaline phosphatase (APISO): 70 U/L (ref 35–144)
BUN/Creatinine Ratio: 20 (calc) (ref 6–22)
BUN: 26 mg/dL — ABNORMAL HIGH (ref 7–25)
CO2: 24 mmol/L (ref 20–32)
Calcium: 9.4 mg/dL (ref 8.6–10.3)
Chloride: 101 mmol/L (ref 98–110)
Creat: 1.31 mg/dL (ref 0.70–1.33)
Globulin: 2.1 g/dL (calc) (ref 1.9–3.7)
Glucose, Bld: 104 mg/dL — ABNORMAL HIGH (ref 65–99)
Potassium: 4.5 mmol/L (ref 3.5–5.3)
Sodium: 138 mmol/L (ref 135–146)
Total Bilirubin: 0.8 mg/dL (ref 0.2–1.2)
Total Protein: 6.3 g/dL (ref 6.1–8.1)

## 2018-12-01 LAB — MAGNESIUM: Magnesium: 1.9 mg/dL (ref 1.5–2.5)

## 2018-12-01 LAB — HEMOGLOBIN A1C
Hgb A1c MFr Bld: 5.4 % of total Hgb (ref <5.7)
Mean Plasma Glucose: 108 (calc)
eAG (mmol/L): 6 (calc)

## 2018-12-01 LAB — VITAMIN D 25 HYDROXY (VIT D DEFICIENCY, FRACTURES): Vit D, 25-Hydroxy: 22 ng/mL — ABNORMAL LOW (ref 30–100)

## 2018-12-01 LAB — VITAMIN B12: Vitamin B-12: 576 pg/mL (ref 200–1100)

## 2019-01-10 NOTE — Progress Notes (Signed)
Travel or Contacts:   Any travel in the past 2 weeks? NO  Have you came in contact with anyone who has Covid?NO  Have you had a positive Covid test? If so, when?NO  Fever >100.4F []  Yes [x]  No []  Unknown  Chills []  Yes [x]  No []  Unknown  Muscle aches (myalgia) []  Yes [x]  No []  Unknown  Runny nose (rhinorrhea) []  Yes [x]  No []  Unknown  Sore throat []  Yes [x]  No []  Unknown Cough (new onset or worsening of chronic cough) []  Yes [x]  No []  Unknown  Shortness of breath (dyspnea) []  Yes [x]  No []  Unknown Nausea or vomiting []  Yes [x]  No []  Unknown  Headache []  Yes [x]  No []  Unknown  Abdominal pain  []  Yes [x]  No []  Unknown  Diarrhea (?3 loose/looser than normal stools/24hr period) []  Yes [x]  No []  Unknown Other, s:pecify  

## 2019-01-11 ENCOUNTER — Encounter: Payer: Self-pay | Admitting: Family Medicine

## 2019-01-11 ENCOUNTER — Ambulatory Visit: Payer: BC Managed Care – PPO | Admitting: Family Medicine

## 2019-01-11 ENCOUNTER — Other Ambulatory Visit: Payer: Self-pay

## 2019-01-11 VITALS — BP 120/80 | HR 79 | Temp 98.1°F | Ht 78.0 in | Wt 290.4 lb

## 2019-01-11 DIAGNOSIS — Z23 Encounter for immunization: Secondary | ICD-10-CM | POA: Diagnosis not present

## 2019-01-11 DIAGNOSIS — E7800 Pure hypercholesterolemia, unspecified: Secondary | ICD-10-CM | POA: Diagnosis not present

## 2019-01-11 DIAGNOSIS — E559 Vitamin D deficiency, unspecified: Secondary | ICD-10-CM

## 2019-01-11 DIAGNOSIS — I10 Essential (primary) hypertension: Secondary | ICD-10-CM

## 2019-01-11 DIAGNOSIS — L219 Seborrheic dermatitis, unspecified: Secondary | ICD-10-CM | POA: Diagnosis not present

## 2019-01-11 MED ORDER — CLOBETASOL PROPIONATE 0.05 % EX FOAM: Freq: Two times a day (BID) | CUTANEOUS | 1 refills | Status: DC

## 2019-01-11 NOTE — Progress Notes (Signed)
Seth Morales is a 58 y.o. male  Chief Complaint  Patient presents with  . Establish Care    TOC from Dr. [name].  Discuss getting some lab work soon.    HPI: Seth Morales is a 58 y.o. male here to transfer care to our office from Dr. [name] at [facility]. Pt was seen by Dr. [name] on 11/30/18 for annual exam, labs.  He would like shingrix vaccine today. He is UTD on flu vaccine.  His labs in 11/2018 showed significantly elevated TG (>400) and low Vit D (22). He is taking Vit D supplement 5,000IU daily.  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 He requests refill of clobetasol foam which he has used in the past for ?seborrheic dermatitis.  Pt denies HA, dizziness, CP, SOB, n/v/d/c, LE edema, fever, chills.   Past Medical History:  Diagnosis Date  . Allergy   . Blood transfusion without reported diagnosis 13 years ago   . Chronic kidney disease   . Hypertension     Past Surgical History:  Procedure Laterality Date  . COLONOSCOPY  09/06/2012   at [place]  . EYE SURGERY    . INSERTION OF DIALYSIS CATHETER    . KIDNEY TRANSPLANT  2006  . KNEE ARTHROPLASTY Bilateral   . NEPHRECTOMY Bilateral     Social History   Socioeconomic History  . Marital status: Married    Spouse name: Not on file  . Number of children: Not on file  . Years of education: Not on file  . Highest education level: Not on file  Occupational History  . Not on file  Social Needs  . Financial resource strain: Not on file  . Food insecurity    Worry: Not on file    Inability: Not on file  . Transportation needs    Medical: Not on file    Non-medical: Not on file  Tobacco Use  . Smoking status: Never Smoker  . Smokeless tobacco: Never Used  Substance and Sexual Activity  . Alcohol use: Yes    Alcohol/week: 2.0 standard drinks    Types: 2 Standard drinks or equivalent per week  . Drug use: No  . Sexual activity: Not on file  Lifestyle  . Physical activity    Days per week: Not on file   Minutes per session: Not on file  . Stress: Not on file  Relationships  . Social Musician on phone: Not on file    Gets together: Not on file    Attends religious service: Not on file    Active member of club or organization: Not on file    Attends meetings of clubs or organizations: Not on file    Relationship status: Not on file  . Intimate partner violence    Fear of current or ex partner: Not on file    Emotionally abused: Not on file    Physically abused: Not on file    Forced sexual activity: Not on file  Other Topics Concern  . Not on file  Social History Narrative  . Not on file    Family History  Problem Relation Age of Onset  . Arthritis Mother   . Hypertension Mother   . Kidney disease Mother   . Colon cancer Neg Hx   . Colon polyps Neg Hx   . Esophageal cancer Neg Hx   . Rectal cancer Neg Hx   . Stomach cancer Neg Hx  Immunization History  Administered Date(s) Administered  . Influenza Inj Mdck Quad Pf 12/09/2015  . Influenza,inj,Quad PF,6+ Mos 12/09/2015, 01/11/2017, 01/01/2018, 11/30/2018  . Influenza,trivalent, recombinat, inj, PF 02/02/2011  . Influenza-Unspecified 01/11/2015, 01/02/2018  . Tdap 03/08/2011, 10/19/2017    Outpatient Encounter Medications as of 01/11/2019  Medication Sig  . allopurinol (ZYLOPRIM) 100 MG tablet Take 100 mg by mouth daily.  Marland Kitchen amLODipine (NORVASC) 5 MG tablet Take 5 mg by mouth daily.  . cholecalciferol (VITAMIN D3) 25 MCG (1000 UT) tablet Take 1,000 Units by mouth daily.  . clobetasol (OLUX) 0.05 % topical foam Apply topically 2 (two) times daily.  . cycloSPORINE (SANDIMMUNE) 100 MG capsule Take 100 mg by mouth 2 (two) times daily.  . cycloSPORINE (SANDIMMUNE) 25 MG capsule Take 25 mg by mouth 2 (two) times daily.  . hydrochlorothiazide (MICROZIDE) 12.5 MG capsule Take 12.5 mg by mouth every other day.  . ketoconazole (NIZORAL) 2 % cream   . losartan (COZAAR) 100 MG tablet Take 100 mg by mouth daily.  .  magnesium oxide-pyridoxine (BEELITH) 362-20 MG TABS Take 1 tablet by mouth daily.  . metroNIDAZOLE (METROCREAM) 0.75 % cream   . Multiple Vitamin (MULTI-VITAMIN) tablet Take by mouth.  . predniSONE (DELTASONE) 5 MG tablet Take 1 tablet (5 mg total) by mouth daily with breakfast.  . Sulfacetamide Sodium-Sulfur 8-4 % SUSP   . [DISCONTINUED] azithromycin (ZITHROMAX) 250 MG tablet 2 po day one, then one po each day until gone.  . cycloSPORINE modified (NEORAL) 100 MG capsule   . cycloSPORINE modified (NEORAL) 25 MG capsule   . [DISCONTINUED] Multiple Vitamins-Minerals (MULTIVITAMIN WITH MINERALS) tablet Take 1 tablet by mouth daily.   No facility-administered encounter medications on file as of 01/11/2019.      ROS: Pertinent positives and negatives noted in HPI. Remainder of ROS non-contributory   No Known Allergies  BP 120/80 (BP Location: Left Arm, Patient Position: Sitting, Cuff Size: Large)   Pulse 79   Temp 98.1 F (36.7 C) (Oral)   Ht 6\' 6"  (1.981 m)   Wt 290 lb 6.4 oz (131.7 kg)   SpO2 97%   BMI 33.56 kg/m   Physical Exam  Constitutional: He is oriented to person, place, and time. He appears well-developed and well-nourished. No distress.  Cardiovascular: Normal rate and regular rhythm.  Pulmonary/Chest: Effort normal and breath sounds normal.  Neurological: He is alert and oriented to person, place, and time.  Psychiatric: He has a normal mood and affect. His behavior is normal.     A/P:  1. Essential hypertension - controlled, at goal - cont current meds  2. Need for shingles vaccine - Varicella-zoster vaccine IM (Shingrix) - RTO in 2-6 mo for #2  3. Seborrheic dermatitis of scalp and face Refill: - clobetasol (OLUX) 0.05 % topical foam; Apply topically 2 (two) times daily.  Dispense: 50 g; Refill: 1  4. Pure hypercholesterolemia - Lipid panel; Future - pt will make fasting lab appt for 2 mo from now - stressed importance of low fat diet, regular walking or  other CV exercise - if no/minimal improvement in TG, will discuss Rx  5. Vitamin D deficiency - VITAMIN D 25 Hydroxy (Vit-D Deficiency, Fractures); Future - pt will make lab appt for 2 mo from now - pt to continue taking OTC supplement 5,000IU daily

## 2019-02-25 ENCOUNTER — Other Ambulatory Visit: Payer: BC Managed Care – PPO

## 2019-03-13 ENCOUNTER — Other Ambulatory Visit: Payer: Self-pay

## 2019-03-14 ENCOUNTER — Ambulatory Visit (INDEPENDENT_AMBULATORY_CARE_PROVIDER_SITE_OTHER): Payer: BC Managed Care – PPO

## 2019-03-14 ENCOUNTER — Other Ambulatory Visit (INDEPENDENT_AMBULATORY_CARE_PROVIDER_SITE_OTHER): Payer: BC Managed Care – PPO

## 2019-03-14 DIAGNOSIS — E7800 Pure hypercholesterolemia, unspecified: Secondary | ICD-10-CM | POA: Diagnosis not present

## 2019-03-14 DIAGNOSIS — Z23 Encounter for immunization: Secondary | ICD-10-CM | POA: Diagnosis not present

## 2019-03-14 DIAGNOSIS — E559 Vitamin D deficiency, unspecified: Secondary | ICD-10-CM | POA: Diagnosis not present

## 2019-03-14 LAB — LIPID PANEL
Cholesterol: 170 mg/dL (ref 0–200)
HDL: 39.7 mg/dL (ref >39.00)
NonHDL: 130.48
Total CHOL/HDL Ratio: 4
Triglycerides: 205 mg/dL — ABNORMAL HIGH (ref 0.0–149.0)
VLDL: 41 mg/dL — ABNORMAL HIGH (ref 0.0–40.0)

## 2019-03-14 LAB — LDL CHOLESTEROL, DIRECT: Direct LDL: 114 mg/dL

## 2019-03-14 LAB — VITAMIN D 25 HYDROXY (VIT D DEFICIENCY, FRACTURES): VITD: 56.28 ng/mL (ref 30.00–100.00)

## 2019-03-14 NOTE — Progress Notes (Signed)
Pt came into the office today to get 2nd shingle shot, 1st one was 01/11/2019. Gave injection into the left deltoid, pt tolerated injection well and gave information sheet to the pt to look at home.

## 2019-03-15 ENCOUNTER — Encounter: Payer: Self-pay | Admitting: Family Medicine

## 2019-05-09 ENCOUNTER — Encounter: Payer: Self-pay | Admitting: Family Medicine

## 2019-05-11 ENCOUNTER — Ambulatory Visit: Payer: BC Managed Care – PPO

## 2019-05-22 ENCOUNTER — Encounter (INDEPENDENT_AMBULATORY_CARE_PROVIDER_SITE_OTHER): Payer: Self-pay | Admitting: Family Medicine

## 2019-05-22 ENCOUNTER — Ambulatory Visit (INDEPENDENT_AMBULATORY_CARE_PROVIDER_SITE_OTHER): Payer: BC Managed Care – PPO | Admitting: Family Medicine

## 2019-05-22 ENCOUNTER — Other Ambulatory Visit: Payer: Self-pay

## 2019-05-22 VITALS — BP 116/72 | HR 60 | Temp 98.0°F | Ht 77.0 in | Wt 285.0 lb

## 2019-05-22 DIAGNOSIS — Z9189 Other specified personal risk factors, not elsewhere classified: Secondary | ICD-10-CM

## 2019-05-22 DIAGNOSIS — E559 Vitamin D deficiency, unspecified: Secondary | ICD-10-CM

## 2019-05-22 DIAGNOSIS — E669 Obesity, unspecified: Secondary | ICD-10-CM

## 2019-05-22 DIAGNOSIS — Z0289 Encounter for other administrative examinations: Secondary | ICD-10-CM

## 2019-05-22 DIAGNOSIS — R5383 Other fatigue: Secondary | ICD-10-CM

## 2019-05-22 DIAGNOSIS — Z6833 Body mass index (BMI) 33.0-33.9, adult: Secondary | ICD-10-CM

## 2019-05-22 DIAGNOSIS — Z1331 Encounter for screening for depression: Secondary | ICD-10-CM

## 2019-05-22 DIAGNOSIS — R0602 Shortness of breath: Secondary | ICD-10-CM | POA: Diagnosis not present

## 2019-05-22 DIAGNOSIS — I10 Essential (primary) hypertension: Secondary | ICD-10-CM

## 2019-05-22 NOTE — Progress Notes (Signed)
Dear Dr. Earlene Morales,   Thank you for referring Seth Morales to our clinic. The following note includes my evaluation and treatment recommendations.  Chief Complaint:   OBESITY Seth Morales (MR# [id number]) is a 59 y.o. male who presents for evaluation and treatment of obesity and related comorbidities. Current BMI is Body mass index is 33.8 kg/m. Seth Morales has been struggling with his weight for many years and has been unsuccessful in either losing weight, maintaining weight loss, or reaching his healthy weight goal.  Seth Morales is currently in the action stage of change and ready to dedicate time achieving and maintaining a healthier weight. Seth Morales is interested in becoming our patient and working on intensive lifestyle modifications including (but not limited to) diet and exercise for weight loss.  Seth Morales says that for breakfast he will have coffee with milk (2 cups), a cinnamon raisin bagel or 1-2 slices of Seth Morales with 1 slice of cheese and 1/2 an avocado (feels satisfied).  For lunch he will have Morales with 3 slices of Seth Morales or ham with cheese, pickles, 1 tbsp of mayonnaise, and horseradish with or without a peanut butter sandwich with mayo and cheese (feels satisfied).  He will have a snack if he does not eat the peanut butter sandwich at lunch (he will eat the peanut butter sandwich in the afternoon).  For dinner, he will have Hello Fresh chicken with white rice, green beans and sauce with a peanut butter sandwich after.  After dinner, he says he will have peanuts or almonds for a snack.  Seth Morales's habits were reviewed today and are as follows: His family eats meals together, he thinks his family will eat healthier with him, he struggles with family and or coworkers weight loss sabotage, his desired weight loss is 50 pounds, he has been heavy most of his life, he started gaining weight in college, his heaviest weight ever was 320 pounds, he craves protein, he snacks occasionally  in the evenings, he skips breakfast sometimes, he frequently eats larger portions than normal and he struggles with emotional eating.  Depression Screen Seth Morales's Food and Mood (modified PHQ-9) score was 3.  Depression screen PHQ 2/9 05/22/2019  Decreased Interest 1  Down, Depressed, Hopeless 1  PHQ - 2 Score 2  Altered sleeping 0  Tired, decreased energy 0  Change in appetite 1  Feeling bad or failure about yourself  0  Trouble concentrating 0  Moving slowly or fidgety/restless 0  Suicidal thoughts 0  PHQ-9 Score 3  Difficult doing work/chores Somewhat difficult   Subjective:   1. Other fatigue Seth Morales denies daytime somnolence and admits to waking up still tired. Patent has a history of symptoms of morning fatigue. Seth Morales generally gets 7 or 8 hours of sleep per night, and states that he has generally restful sleep. Snoring is present. Apneic episodes are not present. Epworth Sleepiness Score is 5.  2. Shortness of breath on exertion Seth Morales notes increasing shortness of breath with exercising and seems to be worsening over time with weight gain. He notes getting out of breath sooner with activity than he used to. This has gotten worse recently. Seth Morales denies shortness of breath at rest or orthopnea.  3. Essential hypertension Review: taking medications as instructed, no medication side effects noted, no chest pain, no chest pressure, no headache.  Blood pressure is well controlled today.  BP Readings from Last 3 Encounters:  05/22/19 116/72  01/11/19 120/80  11/30/18 (!) 158/80   4.  Vitamin D deficiency Seth Morales's Vitamin D level was 56.28 on 03/14/2019. He is currently taking vit D. He denies nausea, vomiting or muscle weakness.  He endorses fatigue.  5. Depression screening Seth Morales was screened for depression as part of his new patient workup today.  6. At risk for osteoporosis Seth Morales is at higher risk of osteopenia and osteoporosis due to Vitamin D deficiency.    Assessment/Plan:   1. Other fatigue Seth Morales does feel that his weight is causing his energy to be lower than it should be. Fatigue may be related to obesity, depression or many other causes. Labs will be ordered, and in the meanwhile, Seth Morales will focus on self care including making healthy food choices, increasing physical activity and focusing on stress reduction. - EKG 12-Lead - Comprehensive metabolic panel - CBC with Differential/Platelet - Hemoglobin A1c - Insulin, random - Vitamin B12 - Folate - T3 - T4, free - TSH  2. Shortness of breath on exertion Seth Morales does feel that he gets out of breath more easily that he used to when he exercises. Seth Morales's shortness of breath appears to be obesity related and exercise induced. He has agreed to work on weight loss and gradually increase exercise to treat his exercise induced shortness of breath. Will continue to monitor closely.  3. Essential hypertension Seth Morales is working on healthy weight loss and exercise to improve blood pressure control. We will watch for signs of hypotension as he continues his lifestyle modifications.  4. Vitamin D deficiency Low Vitamin D level contributes to fatigue and are associated with obesity, breast, and colon cancer. He agrees to continue to take Vitamin D @5 ,000 IU daily and will follow-up for routine testing of Vitamin D, at least 2-3 times per year to avoid over-replacement.  5. Depression screening Seth Morales's depression screen was negative today.  6. At risk for osteoporosis Seth Morales was given approximately 15 minutes of osteoporosis prevention counseling today. Seth Morales is at risk for osteopenia and osteoporosis due to his Vitamin D deficiency. He was encouraged to take his Vitamin D and follow his higher calcium diet and increase strengthening exercise to help strengthen his bones and decrease his risk of osteopenia and osteoporosis.  Repetitive spaced learning was employed today to elicit  superior memory formation and behavioral change.  7. Class 1 obesity with serious comorbidity and body mass index (BMI) of 33.0 to 33.9 in adult, unspecified obesity type Seth Morales is currently in the action stage of change and his goal is to continue with weight loss efforts. I recommend Seth Morales begin the structured treatment plan as follows:  He has agreed to the Category 3 Plan with 2 pieces of Morales with 2 tbsp peanut butter.  Exercise goals: As is.   Behavioral modification strategies: increasing lean protein intake, increasing vegetables, meal planning and cooking strategies, keeping healthy foods in the home and planning for success.  He was informed of the importance of frequent follow-up visits to maximize his success with intensive lifestyle modifications for his multiple health conditions. He was informed we would discuss his lab results at his next visit unless there is a critical issue that needs to be addressed sooner. Seth Morales agreed to keep his next visit at the agreed upon time to discuss these results.  Objective:   Blood pressure 116/72, pulse 60, temperature 98 F (36.7 C), temperature source Oral, height 6\' 5"  (1.956 m), weight 285 lb (129.3 kg), SpO2 95 %. Body mass index is 33.8 kg/m.  EKG: Normal sinus rhythm, rate 66 bpm with  QRS changes in III and aVF similar to 2018.[place]ista Mink Calorimeter completed today shows a VO2 of 268 and a REE of 1862.  His calculated basal metabolic rate is 5643 thus his basal metabolic rate is worse than expected.  General: Cooperative, alert, well developed, in no acute distress. HEENT: Conjunctivae and lids unremarkable. Cardiovascular: Regular rhythm.  Lungs: Normal work of breathing. Neurologic: No focal deficits.   Lab Results  Component Value Date   CREATININE 1.31 11/30/2018   BUN 26 (H) 11/30/2018   NA 138 11/30/2018   K 4.5 11/30/2018   CL 101 11/30/2018   CO2 24 11/30/2018   Lab Results  Component Value Date   ALT 29  11/30/2018   AST 23 11/30/2018   ALKPHOS 66 07/12/2017   BILITOT 0.8 11/30/2018   Lab Results  Component Value Date   HGBA1C 5.4 11/30/2018   HGBA1C 5.6 07/12/2017   HGBA1C 5.2 11/07/2014   Lab Results  Component Value Date   TSH 1.35 07/12/2017   Lab Results  Component Value Date   CHOL 170 03/14/2019   HDL 39.70 03/14/2019   LDLCALC  11/30/2018     Comment:     . LDL cholesterol not calculated. Triglyceride levels greater than 400 mg/dL invalidate calculated LDL results. . Reference range: <100 . Desirable range <100 mg/dL for primary prevention;   <70 mg/dL for patients with CHD or diabetic patients  with > or = 2 CHD risk factors. Marland Kitchen LDL-C is now calculated using the Martin-Hopkins  calculation, which is a validated novel method providing  better accuracy than the Friedewald equation in the  estimation of LDL-C.  Cresenciano Genre et al. Annamaria Helling. 3295;188(41): 2061-2068  (http://education.[facility].com/faq/FAQ164)    LDLDIRECT 114.0 03/14/2019   TRIG 205.0 (H) 03/14/2019   CHOLHDL 4 03/14/2019   Lab Results  Component Value Date   WBC 8.6 11/30/2018   HGB 13.9 11/30/2018   HCT 40.2 11/30/2018   MCV 90.1 11/30/2018   PLT 233 11/30/2018   Attestation Statements:   This is the patient's first visit at [facility]. The patient's NEW PATIENT PACKET was reviewed at length. Included in the packet: current and past health history, medications, allergies, ROS, gynecologic history (women only), surgical history, family history, social history, weight history, weight loss surgery history (for those that have had weight loss surgery), nutritional evaluation, mood and food questionnaire, PHQ9, Epworth questionnaire, sleep habits questionnaire, patient life and health improvement goals questionnaire. These will all be scanned into the patient's chart under media.   During the visit, I independently reviewed the patient's EKG, bioimpedance scale results, and  indirect calorimeter results. I used this information to tailor a meal plan for the patient that will help him to lose weight and will improve his obesity-related conditions going forward. I performed a medically necessary appropriate examination and/or evaluation. I discussed the assessment and treatment plan with the patient. The patient was provided an opportunity to ask questions and all were answered. The patient agreed with the plan and demonstrated an understanding of the instructions. Labs were ordered at this visit and will be reviewed at the next visit unless more critical results need to be addressed immediately. Clinical information was updated and documented in the EMR.   Time spent on visit including pre-visit chart review and post-visit care was 45 minutes.   A separate 15 minutes was spent on risk counseling (see above).    I, Water quality scientist, CMA, am acting as Location manager for [employer],  MD.  I have reviewed the above documentation for accuracy and completeness, and I agree with the above. - Debbra Riding, MD

## 2019-05-23 LAB — COMPREHENSIVE METABOLIC PANEL
ALT: 36 IU/L (ref 0–44)
AST: 26 IU/L (ref 0–40)
Albumin/Globulin Ratio: 2.4 — ABNORMAL HIGH (ref 1.2–2.2)
Albumin: 4.5 g/dL (ref 3.8–4.9)
Alkaline Phosphatase: 80 IU/L (ref 39–117)
BUN/Creatinine Ratio: 18 (ref 9–20)
BUN: 21 mg/dL (ref 6–24)
Bilirubin Total: 0.8 mg/dL (ref 0.0–1.2)
CO2: 25 mmol/L (ref 20–29)
Calcium: 9.5 mg/dL (ref 8.7–10.2)
Chloride: 102 mmol/L (ref 96–106)
Creatinine, Ser: 1.18 mg/dL (ref 0.76–1.27)
GFR calc Af Amer: 78 mL/min/{1.73_m2} (ref >59)
GFR calc non Af Amer: 68 mL/min/{1.73_m2} (ref >59)
Globulin, Total: 1.9 g/dL (ref 1.5–4.5)
Glucose: 100 mg/dL — ABNORMAL HIGH (ref 65–99)
Potassium: 4.5 mmol/L (ref 3.5–5.2)
Sodium: 141 mmol/L (ref 134–144)
Total Protein: 6.4 g/dL (ref 6.0–8.5)

## 2019-05-23 LAB — HEMOGLOBIN A1C
Est. average glucose Bld gHb Est-mCnc: 108 mg/dL
Hgb A1c MFr Bld: 5.4 % (ref 4.8–5.6)

## 2019-05-23 LAB — CBC WITH DIFFERENTIAL/PLATELET
Basophils Absolute: 0.1 10*3/uL (ref 0.0–0.2)
Basos: 1 %
EOS (ABSOLUTE): 0.2 10*3/uL (ref 0.0–0.4)
Eos: 2 %
Hematocrit: 42.1 % (ref 37.5–51.0)
Hemoglobin: 14.2 g/dL (ref 13.0–17.7)
Immature Grans (Abs): 0 10*3/uL (ref 0.0–0.1)
Immature Granulocytes: 0 %
Lymphocytes Absolute: 2.1 10*3/uL (ref 0.7–3.1)
Lymphs: 28 %
MCH: 31.1 pg (ref 26.6–33.0)
MCHC: 33.7 g/dL (ref 31.5–35.7)
MCV: 92 fL (ref 79–97)
Monocytes Absolute: 0.8 10*3/uL (ref 0.1–0.9)
Monocytes: 11 %
Neutrophils Absolute: 4.4 10*3/uL (ref 1.4–7.0)
Neutrophils: 58 %
Platelets: 225 10*3/uL (ref 150–450)
RBC: 4.56 x10E6/uL (ref 4.14–5.80)
RDW: 13 % (ref 11.6–15.4)
WBC: 7.5 10*3/uL (ref 3.4–10.8)

## 2019-05-23 LAB — INSULIN, RANDOM: INSULIN: 14.6 u[IU]/mL (ref 2.6–24.9)

## 2019-05-23 LAB — T4, FREE: Free T4: 1.28 ng/dL (ref 0.82–1.77)

## 2019-05-23 LAB — T3: T3, Total: 93 ng/dL (ref 71–180)

## 2019-05-23 LAB — FOLATE: Folate: 14.2 ng/mL (ref >3.0)

## 2019-05-23 LAB — VITAMIN B12: Vitamin B-12: 647 pg/mL (ref 232–1245)

## 2019-05-23 LAB — TSH: TSH: 2.97 u[IU]/mL (ref 0.450–4.500)

## 2019-05-28 ENCOUNTER — Encounter (INDEPENDENT_AMBULATORY_CARE_PROVIDER_SITE_OTHER): Payer: Self-pay | Admitting: Family Medicine

## 2019-05-29 NOTE — Telephone Encounter (Signed)
Please advise 

## 2019-05-30 ENCOUNTER — Encounter: Payer: Self-pay | Admitting: Family Medicine

## 2019-06-03 ENCOUNTER — Other Ambulatory Visit: Payer: Self-pay

## 2019-06-04 ENCOUNTER — Ambulatory Visit (INDEPENDENT_AMBULATORY_CARE_PROVIDER_SITE_OTHER): Payer: BC Managed Care – PPO | Admitting: Family Medicine

## 2019-06-04 ENCOUNTER — Encounter: Payer: Self-pay | Admitting: Family Medicine

## 2019-06-04 VITALS — BP 130/74 | HR 92 | Temp 98.4°F | Ht 77.0 in | Wt 283.7 lb

## 2019-06-04 DIAGNOSIS — Z789 Other specified health status: Secondary | ICD-10-CM

## 2019-06-04 DIAGNOSIS — L739 Follicular disorder, unspecified: Secondary | ICD-10-CM | POA: Diagnosis not present

## 2019-06-04 MED ORDER — CEPHALEXIN 500 MG PO CAPS: 500.0000 mg | ORAL_CAPSULE | Freq: Two times a day (BID) | ORAL | 0 refills | Status: AC

## 2019-06-04 NOTE — Progress Notes (Signed)
Seth Morales is a 59 y.o. male  Chief Complaint  Patient presents with  . bumps    Pt c/o bumps inside of both upper thigh, starting over 1 month ago    HPI: Seth Morales is a 59 y.o. male complains of bumps on B/L inner thighs x few months. Pt states the day after he scheduled this appt one of them drained.  No fever, chills. No fatigue. No myalgias.   Past Medical History:  Diagnosis Date  . Allergy   . Anemia   . Back pain   . Bilateral swelling of feet   . Blood transfusion without reported diagnosis 13 years ago   . Chronic kidney disease   . Dermatitis   . Hypertension   . Joint pain   . Kidney transplanted   . Vitamin D deficiency     Past Surgical History:  Procedure Laterality Date  . COLONOSCOPY  09/06/2012   at [facility]    . INSERTION OF DIALYSIS CATHETER    . KIDNEY TRANSPLANT  2006  . KNEE ARTHROPLASTY Bilateral   . NEPHRECTOMY Bilateral     Social History   Socioeconomic History  . Marital status: Married    Spouse name: Not on file  . Number of children: Not on file  . Years of education: Not on file  . Highest education level: Not on file  Occupational History  . Not on file  Tobacco Use  . Smoking status: Never Smoker  . Smokeless tobacco: Never Used  Substance and Sexual Activity  . Alcohol use: Yes    Alcohol/week: 2.0 standard drinks    Types: 2 Standard drinks or equivalent per week  . Drug use: No  . Sexual activity: Not on file  Other Topics Concern  . Not on file  Social History Narrative  . Not on file   Social Determinants of Health   Financial Resource Strain:   . Difficulty of Paying Living Expenses:   Food Insecurity:   . Worried About Charity fundraiser in the Last Year:   . Arboriculturist in the Last Year:   Transportation Needs:   . Film/video editor (Medical):   Marland Kitchen Lack of Transportation (Non-Medical):   Physical Activity:   . Days of Exercise per Week:   . Minutes of Exercise  per Session:   Stress:   . Feeling of Stress :   Social Connections:   . Frequency of Communication with Friends and Family:   . Frequency of Social Gatherings with Friends and Family:   . Attends Religious Services:   . Active Member of Clubs or Organizations:   . Attends Archivist Meetings:   Marland Kitchen Marital Status:   Intimate Partner Violence:   . Fear of Current or Ex-Partner:   . Emotionally Abused:   Marland Kitchen Physically Abused:   . Sexually Abused:     Family History  Problem Relation Age of Onset  . Arthritis Mother   . Hypertension Mother   . Kidney disease Mother   . Diabetes Mother   . Diabetes Father   . Colon cancer Neg Hx   . Colon polyps Neg Hx   . Esophageal cancer Neg Hx   . Rectal cancer Neg Hx   . Stomach cancer Neg Hx      Immunization History  Administered Date(s) Administered  . Influenza Inj Mdck Quad Pf 12/09/2015  . Influenza,inj,Quad PF,6+ Mos 12/09/2015, 01/11/2017,  01/01/2018, 11/30/2018  . Influenza,trivalent, recombinat, inj, PF 02/02/2011  . Influenza-Unspecified 01/11/2015, 01/02/2018  . Moderna SARS-COVID-2 Vaccination 05/09/2019  . Tdap 03/08/2011, 10/19/2017  . Zoster Recombinat (Shingrix) 01/11/2019, 03/14/2019    Outpatient Encounter Medications as of 06/04/2019  Medication Sig  . allopurinol (ZYLOPRIM) 100 MG tablet Take 100 mg by mouth daily.  Marland Kitchen amLODipine (NORVASC) 5 MG tablet Take 5 mg by mouth daily.  . Cholecalciferol (VITAMIN D) 125 MCG (5000 UT) CAPS Take 5,000 Units by mouth daily.   . cycloSPORINE (SANDIMMUNE) 100 MG capsule Take 100 mg by mouth 2 (two) times daily.  . cycloSPORINE (SANDIMMUNE) 25 MG capsule Take 25 mg by mouth 2 (two) times daily.  . hydrochlorothiazide (MICROZIDE) 12.5 MG capsule Take 12.5 mg by mouth every other day.  . losartan (COZAAR) 100 MG tablet Take 100 mg by mouth daily.  . magnesium oxide-pyridoxine (BEELITH) 362-20 MG TABS Take 1 tablet by mouth daily.  . Multiple Vitamin (MULTI-VITAMIN)  tablet Take by mouth.  Marland Kitchen MYCOPHENOLATE SODIUM PO Take 750 mg by mouth in the morning and at bedtime.  . predniSONE (DELTASONE) 5 MG tablet Take 1 tablet (5 mg total) by mouth daily with breakfast.  . Sulfacetamide Sodium-Sulfur 8-4 % SUSP   . neomycin-polymyxin b-dexamethasone (MAXITROL) 3.5-10000-0.1 OINT APPLY TO EYELIDS EVERY 8 HRS AS DIRECTED   No facility-administered encounter medications on file as of 06/04/2019.     ROS: Pertinent positives and negatives noted in HPI. Remainder of ROS non-contributory  No Known Allergies  BP 130/74 (BP Location: Left Arm, Patient Position: Sitting, Cuff Size: Normal)   Pulse 92   Temp 98.4 F (36.9 C) (Temporal)   Ht 6\' 5"  (1.956 m)   Wt 283 lb 11.2 oz (128.7 kg)   SpO2 97%   BMI 33.64 kg/m    Wt Readings from Last 3 Encounters:  06/04/19 283 lb 11.2 oz (128.7 kg)  05/22/19 285 lb (129.3 kg)  01/11/19 290 lb 6.4 oz (131.7 kg)     Physical Exam  Constitutional: He is oriented to person, place, and time. He appears well-developed and well-nourished. No distress.  Neurological: He is alert and oriented to person, place, and time.  Skin: Rash noted.  Upper inner thigh B/L with erythematous papules, more so on Rt > Lt. Some TTP.   Psychiatric: He has a normal mood and affect. His behavior is normal.     A/P:  1. Folliculitis - warm compress BID Rx: - cephALEXin (KEFLEX) 500 MG capsule; Take 1 capsule (500 mg total) by mouth 2 (two) times daily for 10 days.  Dispense: 20 capsule; Refill: 0 - f/u PRN if symptoms worsen or do not resolve/improve in 2-3 wks Discussed plan and reviewed medications with patient, including risks, benefits, and potential side effects. Pt expressed understand. All questions answered.  This visit occurred during the SARS-CoV-2 public health emergency.  Safety protocols were in place, including screening questions prior to the visit, additional usage of staff PPE, and extensive cleaning of exam room while  observing appropriate contact time as indicated for disinfecting solutions.

## 2019-06-05 ENCOUNTER — Encounter (INDEPENDENT_AMBULATORY_CARE_PROVIDER_SITE_OTHER): Payer: Self-pay | Admitting: Family Medicine

## 2019-06-05 ENCOUNTER — Ambulatory Visit (INDEPENDENT_AMBULATORY_CARE_PROVIDER_SITE_OTHER): Payer: BC Managed Care – PPO | Admitting: Family Medicine

## 2019-06-05 ENCOUNTER — Other Ambulatory Visit: Payer: Self-pay

## 2019-06-05 VITALS — BP 108/69 | HR 64 | Temp 97.6°F | Ht 77.0 in | Wt 277.0 lb

## 2019-06-05 DIAGNOSIS — E669 Obesity, unspecified: Secondary | ICD-10-CM | POA: Diagnosis not present

## 2019-06-05 DIAGNOSIS — Z9189 Other specified personal risk factors, not elsewhere classified: Secondary | ICD-10-CM

## 2019-06-05 DIAGNOSIS — E559 Vitamin D deficiency, unspecified: Secondary | ICD-10-CM

## 2019-06-05 DIAGNOSIS — Z6832 Body mass index (BMI) 32.0-32.9, adult: Secondary | ICD-10-CM

## 2019-06-05 DIAGNOSIS — E8881 Metabolic syndrome: Secondary | ICD-10-CM | POA: Diagnosis not present

## 2019-06-05 NOTE — Progress Notes (Signed)
Chief Complaint:   OBESITY Seth Morales is here to discuss his progress with his obesity treatment plan along with follow-up of his obesity related diagnoses. Seth Morales is on the Category 3 Plan and states he is following his eating plan approximately 100% of the time. Seth Morales states he mowed the yard and walked 40 minutes/2 miles 2-3 times in 1 week.  Today's visit was #: 2 Starting weight: 285 lbs Starting date: 05/22/2019 Today's weight: 277 lbs Today's date: 06/05/2019 Total lbs lost to date: 8 lbs Total lbs lost since last in-office visit: 8 lbs  Interim History: Seth Morales voices that he has notices an increase in urination.  He says that he thinks he is getting to much protein for his kidney.  He is hungry when he goes to bed.  For snacks, he has 100 calorie snack packs, string cheese, Babybell with crackers.  He has started performing at gigs again.  Subjective:   1. Vitamin D deficiency Seth Morales Vitamin D level was 56.28 on 03/14/2019. He is currently taking OTC vitamin D 5000 each day. He denies nausea, vomiting or muscle weakness.  He endorses fatigue.  2. Insulin resistance Seth Morales has a diagnosis of insulin resistance based on his elevated fasting insulin level >5. He continues to work on diet and exercise to decrease his risk of diabetes.  He is not on any medications.  He occasionally has cravings for starch.  Lab Results  Component Value Date   INSULIN 14.6 05/22/2019   Lab Results  Component Value Date   HGBA1C 5.4 05/22/2019   3. At risk for diabetes mellitus Seth Morales is at higher than average risk for developing diabetes due to his obesity.   Assessment/Plan:   1. Vitamin D deficiency Low Vitamin D level contributes to fatigue and are associated with obesity, breast, and colon cancer. He agrees to continue to take OTC Vitamin D @5 ,000 IU daily and will follow-up for routine testing of Vitamin D, at least 2-3 times per year to avoid over-replacement.  2. Insulin  resistance Seth Morales will continue to work on weight loss, exercise, and decreasing simple carbohydrates to help decrease the risk of diabetes. Seth Morales agreed to follow-up with [place] as directed to closely monitor his progress.  3. At risk for diabetes mellitus Seth Morales was given approximately 15 minutes of diabetes education and counseling today. We discussed intensive lifestyle modifications today with an emphasis on weight loss as well as increasing exercise and decreasing simple carbohydrates in his diet. We also reviewed medication options with an emphasis on risk versus benefit of those discussed.   Repetitive spaced learning was employed today to elicit superior memory formation and behavioral change.  4. Class 1 obesity with serious comorbidity and body mass index (BMI) of 32.0 to 32.9 in adult, unspecified obesity type Seth Morales is currently in the action stage of change. As such, his goal is to continue with weight loss efforts. He has agreed to the Category 3 Plan and keeping a food journal and adhering to recommended goals of 1450-1600 calories and 100+ grams of protein daily.   Exercise goals: As is.  Behavioral modification strategies: increasing lean protein intake, increasing vegetables, meal planning and cooking strategies, keeping healthy foods in the home and planning for success.  Seth Morales has agreed to follow-up with our clinic in 2 weeks. He was informed of the importance of frequent follow-up visits to maximize his success with intensive lifestyle modifications for his multiple health conditions.   Objective:   Blood pressure 108/69,  pulse 64, temperature 97.6 F (36.4 C), temperature source Oral, height 6\' 5"  (1.956 m), weight 277 lb (125.6 kg), SpO2 98 %. Body mass index is 32.85 kg/m.  General: Cooperative, alert, well developed, in no acute distress. HEENT: Conjunctivae and lids unremarkable. Cardiovascular: Regular rhythm.  Lungs: Normal work of breathing. Neurologic:  No focal deficits.   Lab Results  Component Value Date   CREATININE 1.18 05/22/2019   BUN 21 05/22/2019   NA 141 05/22/2019   K 4.5 05/22/2019   CL 102 05/22/2019   CO2 25 05/22/2019   Lab Results  Component Value Date   ALT 36 05/22/2019   AST 26 05/22/2019   ALKPHOS 80 05/22/2019   BILITOT 0.8 05/22/2019   Lab Results  Component Value Date   HGBA1C 5.4 05/22/2019   HGBA1C 5.4 11/30/2018   HGBA1C 5.6 07/12/2017   HGBA1C 5.2 11/07/2014   Lab Results  Component Value Date   INSULIN 14.6 05/22/2019   Lab Results  Component Value Date   TSH 2.970 05/22/2019   Lab Results  Component Value Date   CHOL 170 03/14/2019   HDL 39.70 03/14/2019   LDLCALC  11/30/2018     Comment:     . LDL cholesterol not calculated. Triglyceride levels greater than 400 mg/dL invalidate calculated LDL results. . Reference range: <100 . Desirable range <100 mg/dL for primary prevention;   <70 mg/dL for patients with CHD or diabetic patients  with > or = 2 CHD risk factors. 12/02/2018 LDL-C is now calculated using the Martin-Hopkins  calculation, which is a validated novel method providing  better accuracy than the Friedewald equation in the  estimation of LDL-C.  Marland Kitchen et al. Horald Pollen. Lenox Ahr): 2061-2068  (http://education.[facility].com/faq/FAQ164)    LDLDIRECT 114.0 03/14/2019   TRIG 205.0 (H) 03/14/2019   CHOLHDL 4 03/14/2019   Lab Results  Component Value Date   WBC 7.5 05/22/2019   HGB 14.2 05/22/2019   HCT 42.1 05/22/2019   MCV 92 05/22/2019   PLT 225 05/22/2019   Attestation Statements:   Reviewed by clinician on day of visit: allergies, medications, problem list, medical history, surgical history, family history, social history, and previous encounter notes.  I, 05/24/2019, CMA, am acting as transcriptionist for Insurance claims handler, MD.  I have reviewed the above documentation for accuracy and completeness, and I agree with the above. - Reuben Likes, MD

## 2019-06-10 ENCOUNTER — Encounter (INDEPENDENT_AMBULATORY_CARE_PROVIDER_SITE_OTHER): Payer: Self-pay | Admitting: Family Medicine

## 2019-06-10 NOTE — Telephone Encounter (Signed)
Please advise. Thanks.  

## 2019-06-24 ENCOUNTER — Other Ambulatory Visit: Payer: Self-pay

## 2019-06-24 ENCOUNTER — Encounter (INDEPENDENT_AMBULATORY_CARE_PROVIDER_SITE_OTHER): Payer: Self-pay | Admitting: Family Medicine

## 2019-06-24 ENCOUNTER — Ambulatory Visit (INDEPENDENT_AMBULATORY_CARE_PROVIDER_SITE_OTHER): Payer: BC Managed Care – PPO | Admitting: Family Medicine

## 2019-06-24 VITALS — BP 116/71 | HR 57 | Temp 97.9°F | Ht 77.0 in | Wt 273.0 lb

## 2019-06-24 DIAGNOSIS — Z9189 Other specified personal risk factors, not elsewhere classified: Secondary | ICD-10-CM | POA: Diagnosis not present

## 2019-06-24 DIAGNOSIS — I10 Essential (primary) hypertension: Secondary | ICD-10-CM | POA: Diagnosis not present

## 2019-06-24 DIAGNOSIS — R0602 Shortness of breath: Secondary | ICD-10-CM

## 2019-06-24 DIAGNOSIS — E8881 Metabolic syndrome: Secondary | ICD-10-CM | POA: Diagnosis not present

## 2019-06-24 DIAGNOSIS — E669 Obesity, unspecified: Secondary | ICD-10-CM

## 2019-06-24 DIAGNOSIS — Z6832 Body mass index (BMI) 32.0-32.9, adult: Secondary | ICD-10-CM

## 2019-06-24 DIAGNOSIS — E559 Vitamin D deficiency, unspecified: Secondary | ICD-10-CM

## 2019-06-27 NOTE — Progress Notes (Signed)
Chief Complaint:   OBESITY Seth Morales is here to discuss his progress with his obesity treatment plan along with follow-up of his obesity related diagnoses. Seth Morales is on the Category 3 Plan and states he is following his eating plan approximately 100% of the time. Seth Morales states he is walking 2-3 miles 6 times per week.  Today's visit was #: 3 Starting weight: 285 lbs Starting date: 05/22/2019 Today's weight: 273 lbs Today's date: 06/24/2019 Total lbs lost to date: 12 lbs Total lbs lost since last in-office visit: 4 lbs  Interim History: Seth Morales reports being bored with the current diet, especially dinner.  He has history of a kidney transplant.  Subjective:   1. Insulin resistance Seth Morales has a diagnosis of insulin resistance based on his elevated fasting insulin level >5. He continues to work on diet and exercise to decrease his risk of diabetes.  Lab Results  Component Value Date   INSULIN 14.6 05/22/2019   Lab Results  Component Value Date   HGBA1C 5.4 05/22/2019   2. Vitamin D deficiency Seth Morales's Vitamin D level was 56.28 on 03/14/2019. He is currently taking OTC vitamin D 5000 IU each day. He denies nausea, vomiting or muscle weakness.    3. Essential hypertension Review: taking medications as instructed, no medication side effects noted, no chest pain on exertion, no dyspnea on exertion, no swelling of ankles.  He takes amlodipine, Cozaar, and HCTZ for blood pressure.  BP Readings from Last 3 Encounters:  06/24/19 116/71  06/05/19 108/69  06/04/19 130/74   4. Shortness of breath on exertion Seth Morales notes increasing shortness of breath with exercising. He notes getting out of breath sooner with activity than he used to. This has gotten worse recently. Seth Morales denies shortness of breath at rest or orthopnea.   Assessment/Plan:   1. Insulin resistance Seth Morales will continue to work on weight loss, exercise, and decreasing simple carbohydrates to help decrease the  risk of diabetes. Seth Morales agreed to follow-up with [place] as directed to closely monitor his progress.  2. Vitamin D deficiency Low Vitamin D level contributes to fatigue and are associated with obesity, breast, and colon cancer. He agrees to continue to take OTC Vitamin D @5 ,000 IU every week and will follow-up for routine testing of Vitamin D, at least 2-3 times per year to avoid over-replacement.  3. Essential hypertension Seth Morales is working on healthy weight loss and exercise to improve blood pressure control. We will watch for signs of hypotension as he continues his lifestyle modifications.  4. Shortness of breath on exertion Will order cardiac echo, as per below.  Orders - ECHOCARDIOGRAM COMPLETE; Future  5. At risk for heart disease Seth Morales was given approximately 15 minutes of coronary artery disease prevention counseling today. He is 59 y.o. male and has risk factors for heart disease including obesity. We discussed intensive lifestyle modifications today with an emphasis on specific weight loss instructions and strategies.   Repetitive spaced learning was employed today to elicit superior memory formation and behavioral change.  6. Class 1 obesity with serious comorbidity and body mass index (BMI) of 32.0 to 32.9 in adult, unspecified obesity type Seth Morales is currently in the action stage of change. As such, his goal is to continue with weight loss efforts. He has agreed to the Category 3 Plan.   Exercise goals: For substantial health benefits, adults should do at least 150 minutes (2 hours and 30 minutes) a week of moderate-intensity, or 75 minutes (1 hour and 15 minutes)  a week of vigorous-intensity aerobic physical activity, or an equivalent combination of moderate- and vigorous-intensity aerobic activity. Aerobic activity should be performed in episodes of at least 10 minutes, and preferably, it should be spread throughout the week.  Behavioral modification strategies: increasing  lean protein intake and increasing water intake.  Seth Morales has agreed to follow-up with our clinic in 2 weeks. He was informed of the importance of frequent follow-up visits to maximize his success with intensive lifestyle modifications for his multiple health conditions.   Objective:   Blood pressure 116/71, pulse (!) 57, temperature 97.9 F (36.6 C), temperature source Oral, height 6\' 5"  (1.956 m), weight 273 lb (123.8 kg), SpO2 99 %. Body mass index is 32.37 kg/m.  General: Cooperative, alert, well developed, in no acute distress. HEENT: Conjunctivae and lids unremarkable. Cardiovascular: Regular rhythm.  Lungs: Normal work of breathing. Neurologic: No focal deficits.   Lab Results  Component Value Date   CREATININE 1.18 05/22/2019   BUN 21 05/22/2019   NA 141 05/22/2019   K 4.5 05/22/2019   CL 102 05/22/2019   CO2 25 05/22/2019   Lab Results  Component Value Date   ALT 36 05/22/2019   AST 26 05/22/2019   ALKPHOS 80 05/22/2019   BILITOT 0.8 05/22/2019   Lab Results  Component Value Date   HGBA1C 5.4 05/22/2019   HGBA1C 5.4 11/30/2018   HGBA1C 5.6 07/12/2017   HGBA1C 5.2 11/07/2014   Lab Results  Component Value Date   INSULIN 14.6 05/22/2019   Lab Results  Component Value Date   TSH 2.970 05/22/2019   Lab Results  Component Value Date   CHOL 170 03/14/2019   HDL 39.70 03/14/2019   LDLCALC  11/30/2018     Comment:     . LDL cholesterol not calculated. Triglyceride levels greater than 400 mg/dL invalidate calculated LDL results. . Reference range: <100 . Desirable range <100 mg/dL for primary prevention;   <70 mg/dL for patients with CHD or diabetic patients  with > or = 2 CHD risk factors. 12/02/2018 LDL-C is now calculated using the Martin-Hopkins  calculation, which is a validated novel method providing  better accuracy than the Friedewald equation in the  estimation of LDL-C.  Marland Kitchen et al. Horald Pollen. Lenox Ahr): 2061-2068    (http://education.[facility].com/faq/FAQ164)    LDLDIRECT 114.0 03/14/2019   TRIG 205.0 (H) 03/14/2019   CHOLHDL 4 03/14/2019   Lab Results  Component Value Date   WBC 7.5 05/22/2019   HGB 14.2 05/22/2019   HCT 42.1 05/22/2019   MCV 92 05/22/2019   PLT 225 05/22/2019   Attestation Statements:   Reviewed by clinician on day of visit: allergies, medications, problem list, medical history, surgical history, family history, social history, and previous encounter notes.  I, 05/24/2019, CMA, am acting as Insurance claims handler for Energy manager, DO.  I have reviewed the above documentation for accuracy and completeness, and I agree with the above. [employer], DO

## 2019-07-03 ENCOUNTER — Ambulatory Visit (HOSPITAL_BASED_OUTPATIENT_CLINIC_OR_DEPARTMENT_OTHER): Payer: BC Managed Care – PPO

## 2019-07-04 ENCOUNTER — Encounter (INDEPENDENT_AMBULATORY_CARE_PROVIDER_SITE_OTHER): Payer: Self-pay | Admitting: Family Medicine

## 2019-07-08 NOTE — Telephone Encounter (Signed)
Please review. Thanks!  

## 2019-07-15 ENCOUNTER — Other Ambulatory Visit: Payer: Self-pay

## 2019-07-15 ENCOUNTER — Ambulatory Visit (INDEPENDENT_AMBULATORY_CARE_PROVIDER_SITE_OTHER): Payer: BC Managed Care – PPO | Admitting: Family Medicine

## 2019-07-15 ENCOUNTER — Encounter (INDEPENDENT_AMBULATORY_CARE_PROVIDER_SITE_OTHER): Payer: Self-pay | Admitting: Family Medicine

## 2019-07-15 VITALS — BP 113/72 | HR 67 | Temp 97.8°F | Ht 77.0 in | Wt 265.0 lb

## 2019-07-15 DIAGNOSIS — Z9189 Other specified personal risk factors, not elsewhere classified: Secondary | ICD-10-CM | POA: Diagnosis not present

## 2019-07-15 DIAGNOSIS — I10 Essential (primary) hypertension: Secondary | ICD-10-CM

## 2019-07-15 DIAGNOSIS — R0781 Pleurodynia: Secondary | ICD-10-CM | POA: Diagnosis not present

## 2019-07-15 DIAGNOSIS — Z6831 Body mass index (BMI) 31.0-31.9, adult: Secondary | ICD-10-CM

## 2019-07-15 DIAGNOSIS — Z79899 Other long term (current) drug therapy: Secondary | ICD-10-CM | POA: Diagnosis not present

## 2019-07-15 DIAGNOSIS — E669 Obesity, unspecified: Secondary | ICD-10-CM

## 2019-07-15 NOTE — Progress Notes (Signed)
Chief Complaint:   OBESITY Seth Morales is here to discuss his progress with his obesity treatment plan along with follow-up of his obesity related diagnoses. Seth Morales is on the Category 3 Plan and states he is following his eating plan approximately 90-95% of the time. Seth Morales states he is exercising for 0 minutes 0 times per week.  Today's visit was #: 4 Starting weight: 285 lbs Starting date: 05/22/2019 Today's weight: 265 lbs Today's date: 07/15/2019 Total lbs lost to date: 20 lbs Total lbs lost since last in-office visit: 8 lbs  Interim History: Seth Morales has a health screening scheduled.  He had a full week last week (out of his hammock).  He reports some left posterior rib pain and is seeing a chiropractor.  Denies polyphagia.  Endorses some orthostatic symptoms.  Subjective:   1. Essential hypertension He is having orthostatic symptoms.  He will see his nephrologist on July 21.   BP Readings from Last 3 Encounters:  07/15/19 113/72  06/24/19 116/71  06/05/19 108/69   2. Rib pain on left side, posterior He has had a decrease in exercise due to this.  3. High risk medication use He is taking daily prednisone and cyclosporin.  4. At risk for complication associated with hypotension The patient is at a higher than average risk of hypotension due to weight loss.  Assessment/Plan:   1. Essential hypertension Stop HCTZ due to orthostatic symptoms.  2. Rib pain on left side, posterior Will continue to monitor.  3. High risk medication use Current treatment plan is effective, no change in therapy. Orders and follow up as documented in patient record.  4. At risk for complication associated with hypotension Seth Morales was given approximately 15 minutes of education and counseling today to help avoid hypotension. We discussed risks of hypotension with weight loss and signs of hypotension such as feeling lightheaded or unsteady.  Repetitive spaced learning was employed today to  elicit superior memory formation and behavioral change.  5. Class 1 obesity with serious comorbidity and body mass index (BMI) of 31.0 to 31.9 in adult, unspecified obesity type Seth Morales is currently in the action stage of change. As such, his goal is to continue with weight loss efforts. He has agreed to the Category 3 Plan +100 calories.   Exercise goals: For substantial health benefits, adults should do at least 150 minutes (2 hours and 30 minutes) a week of moderate-intensity, or 75 minutes (1 hour and 15 minutes) a week of vigorous-intensity aerobic physical activity, or an equivalent combination of moderate- and vigorous-intensity aerobic activity. Aerobic activity should be performed in episodes of at least 10 minutes, and preferably, it should be spread throughout the week.  Behavioral modification strategies: increasing vegetables, increasing water intake, decreasing liquid calories and decreasing alcohol intake.  Seth Morales has agreed to follow-up with our clinic in 2 weeks. He was informed of the importance of frequent follow-up visits to maximize his success with intensive lifestyle modifications for his multiple health conditions.   Objective:   Blood pressure 113/72, pulse 67, temperature 97.8 F (36.6 C), temperature source Oral, height 6\' 5"  (1.956 m), weight 265 lb (120.2 kg), SpO2 98 %. Body mass index is 31.42 kg/m.  General: Cooperative, alert, well developed, in no acute distress. HEENT: Conjunctivae and lids unremarkable. Cardiovascular: Regular rhythm.  Lungs: Normal work of breathing. Neurologic: No focal deficits.   Lab Results  Component Value Date   CREATININE 1.18 05/22/2019   BUN 21 05/22/2019   NA 141 05/22/2019  K 4.5 05/22/2019   CL 102 05/22/2019   CO2 25 05/22/2019   Lab Results  Component Value Date   ALT 36 05/22/2019   AST 26 05/22/2019   ALKPHOS 80 05/22/2019   BILITOT 0.8 05/22/2019   Lab Results  Component Value Date   HGBA1C 5.4  05/22/2019   HGBA1C 5.4 11/30/2018   HGBA1C 5.6 07/12/2017   HGBA1C 5.2 11/07/2014   Lab Results  Component Value Date   INSULIN 14.6 05/22/2019   Lab Results  Component Value Date   TSH 2.970 05/22/2019   Lab Results  Component Value Date   CHOL 170 03/14/2019   HDL 39.70 03/14/2019   LDLCALC  11/30/2018     Comment:     . LDL cholesterol not calculated. Triglyceride levels greater than 400 mg/dL invalidate calculated LDL results. . Reference range: <100 . Desirable range <100 mg/dL for primary prevention;   <70 mg/dL for patients with CHD or diabetic patients  with > or = 2 CHD risk factors. Marland Kitchen LDL-C is now calculated using the Martin-Hopkins  calculation, which is a validated novel method providing  better accuracy than the Friedewald equation in the  estimation of LDL-C.  Cresenciano Genre et al. Annamaria Helling. 5732;202(54): 2061-2068  (http://education.[facility].com/faq/FAQ164)    LDLDIRECT 114.0 03/14/2019   TRIG 205.0 (H) 03/14/2019   CHOLHDL 4 03/14/2019   Lab Results  Component Value Date   WBC 7.5 05/22/2019   HGB 14.2 05/22/2019   HCT 42.1 05/22/2019   MCV 92 05/22/2019   PLT 225 05/22/2019   Attestation Statements:   Reviewed by clinician on day of visit: allergies, medications, problem list, medical history, surgical history, family history, social history, and previous encounter notes.  I, Water quality scientist, CMA, am acting as Location manager for [employer], DO.  I have reviewed the above documentation for accuracy and completeness, and I agree with the above. Briscoe Deutscher, DO

## 2019-07-19 ENCOUNTER — Encounter: Payer: Self-pay | Admitting: Family Medicine

## 2019-07-22 ENCOUNTER — Encounter (INDEPENDENT_AMBULATORY_CARE_PROVIDER_SITE_OTHER): Payer: Self-pay | Admitting: Family Medicine

## 2019-07-22 NOTE — Telephone Encounter (Signed)
Please advise 

## 2019-07-24 ENCOUNTER — Encounter (INDEPENDENT_AMBULATORY_CARE_PROVIDER_SITE_OTHER): Payer: Self-pay | Admitting: Family Medicine

## 2019-07-24 ENCOUNTER — Encounter: Payer: Self-pay | Admitting: Family Medicine

## 2019-07-24 NOTE — Telephone Encounter (Signed)
Please review. thanks

## 2019-08-06 ENCOUNTER — Encounter: Payer: Self-pay | Admitting: Family Medicine

## 2019-08-06 DIAGNOSIS — Z789 Other specified health status: Secondary | ICD-10-CM

## 2019-08-07 ENCOUNTER — Other Ambulatory Visit: Payer: Self-pay

## 2019-08-07 ENCOUNTER — Ambulatory Visit (INDEPENDENT_AMBULATORY_CARE_PROVIDER_SITE_OTHER): Payer: BC Managed Care – PPO | Admitting: Family Medicine

## 2019-08-07 VITALS — BP 111/69 | HR 61 | Temp 97.9°F | Ht 77.0 in | Wt 263.0 lb

## 2019-08-07 DIAGNOSIS — E669 Obesity, unspecified: Secondary | ICD-10-CM | POA: Diagnosis not present

## 2019-08-07 DIAGNOSIS — Q613 Polycystic kidney, unspecified: Secondary | ICD-10-CM | POA: Diagnosis not present

## 2019-08-07 DIAGNOSIS — Z6831 Body mass index (BMI) 31.0-31.9, adult: Secondary | ICD-10-CM

## 2019-08-07 DIAGNOSIS — I10 Essential (primary) hypertension: Secondary | ICD-10-CM

## 2019-08-07 DIAGNOSIS — E785 Hyperlipidemia, unspecified: Secondary | ICD-10-CM

## 2019-08-08 NOTE — Progress Notes (Signed)
Chief Complaint:   OBESITY Seth Morales is here to discuss his progress with his obesity treatment plan along with follow-up of his obesity related diagnoses. Seth Morales is on the Category 3 Plan and states he is following his eating plan approximately 90% of the time. Seth Morales states he is walking for 45 minutes 3-4 times per week.  Today's visit was #: 5 Starting weight: 285 lbs Starting date: 05/22/2019 Today's weight: 263 lbs Today's date: 08/07/2019 Total lbs lost to date: 22 lbs Total lbs lost since last in-office visit: 2  Interim History: Seth Morales is down 22 pounds today.  He reports that he has started taking fish oil.  He is up 4 pounds of fluid.  Subjective:   1. Essential hypertension, edema Jamarien had a previous trial of HCTZ with rapid increase in edema.  HCTZ was discontinued.  BP Readings from Last 3 Encounters:  08/07/19 111/69  07/15/19 113/72  06/24/19 116/71   2. Polycystic kidney disease Cordarrel is status post transplant and is on immunosuppressants.  He had the Moderna vaccine x2.    3. Dyslipidemia Mikiah has dyslipidemia and has been trying to improve his cholesterol levels with intensive lifestyle modification including a low saturated fat diet, exercise and weight loss. He denies any chest pain, claudication or myalgias.    Lab Results  Component Value Date   ALT 36 05/22/2019   AST 26 05/22/2019   ALKPHOS 80 05/22/2019   BILITOT 0.8 05/22/2019   Lab Results  Component Value Date   CHOL 170 03/14/2019   HDL 39.70 03/14/2019   LDLCALC  11/30/2018     Comment:     . LDL cholesterol not calculated. Triglyceride levels greater than 400 mg/dL invalidate calculated LDL results. . Reference range: <100 . Desirable range <100 mg/dL for primary prevention;   <70 mg/dL for patients with CHD or diabetic patients  with > or = 2 CHD risk factors. Marland Kitchen LDL-C is now calculated using the Martin-Hopkins  calculation, which is a validated novel method  providing  better accuracy than the Friedewald equation in the  estimation of LDL-C.  Horald Pollen et al. Lenox Ahr. 8588;502(77): 2061-2068  (http://education.[facility].com/faq/FAQ164)    LDLDIRECT 114.0 03/14/2019   TRIG 205.0 (H) 03/14/2019   CHOLHDL 4 03/14/2019   Assessment/Plan:   1. Essential hypertension, edema Seth Morales is working on healthy weight loss and exercise to improve blood pressure control. We will watch for signs of hypotension as he continues his lifestyle modifications.  2. Polycystic kidney disease Current treatment plan is effective, no change in therapy.  3. Dyslipidemia Cardiovascular risk and specific lipid/LDL goals reviewed.  We discussed several lifestyle modifications today and Seth Morales will continue to work on diet, exercise and weight loss efforts. Orders and follow up as documented in patient record.  [name] has started taking fish oil.  Counseling Intensive lifestyle modifications are the first line treatment for this issue. . Dietary changes: Increase soluble fiber. Decrease simple carbohydrates. . Exercise changes: Moderate to vigorous-intensity aerobic activity 150 minutes per week if tolerated. . Lipid-lowering medications: see documented in medical record.  4. Class 1 obesity with serious comorbidity and body mass index (BMI) of 31.0 to 31.9 in adult, unspecified obesity type [name] is currently in the action stage of change. As such, his goal is to continue with weight loss efforts. He has agreed to the Category 3 Plan.   Exercise goals: For substantial health benefits, adults should do at least 150 minutes (2 hours and 30 minutes) a  week of moderate-intensity, or 75 minutes (1 hour and 15 minutes) a week of vigorous-intensity aerobic physical activity, or an equivalent combination of moderate- and vigorous-intensity aerobic activity. Aerobic activity should be performed in episodes of at least 10 minutes, and preferably, it should be spread  throughout the week.  Behavioral modification strategies: increasing lean protein intake and increasing water intake.  Seth Morales has agreed to follow-up with our clinic in 3 weeks. He was informed of the importance of frequent follow-up visits to maximize his success with intensive lifestyle modifications for his multiple health conditions.   Objective:   Blood pressure 111/69, pulse 61, temperature 97.9 F (36.6 C), temperature source Oral, height 6\' 5"  (1.956 m), weight 263 lb (119.3 kg), SpO2 97 %. Body mass index is 31.19 kg/m.  General: Cooperative, alert, well developed, in no acute distress. HEENT: Conjunctivae and lids unremarkable. Cardiovascular: Regular rhythm.  Lungs: Normal work of breathing. Neurologic: No focal deficits.   Lab Results  Component Value Date   CREATININE 1.18 05/22/2019   BUN 21 05/22/2019   NA 141 05/22/2019   K 4.5 05/22/2019   CL 102 05/22/2019   CO2 25 05/22/2019   Lab Results  Component Value Date   ALT 36 05/22/2019   AST 26 05/22/2019   ALKPHOS 80 05/22/2019   BILITOT 0.8 05/22/2019   Lab Results  Component Value Date   HGBA1C 5.4 05/22/2019   HGBA1C 5.4 11/30/2018   HGBA1C 5.6 07/12/2017   HGBA1C 5.2 11/07/2014   Lab Results  Component Value Date   INSULIN 14.6 05/22/2019   Lab Results  Component Value Date   TSH 2.970 05/22/2019   Lab Results  Component Value Date   CHOL 170 03/14/2019   HDL 39.70 03/14/2019   LDLCALC  11/30/2018     Comment:     . LDL cholesterol not calculated. Triglyceride levels greater than 400 mg/dL invalidate calculated LDL results. . Reference range: <100 . Desirable range <100 mg/dL for primary prevention;   <70 mg/dL for patients with CHD or diabetic patients  with > or = 2 CHD risk factors. Marland Kitchen LDL-C is now calculated using the Martin-Hopkins  calculation, which is a validated novel method providing  better accuracy than the Friedewald equation in the  estimation of LDL-C.  Cresenciano Genre  et al. Annamaria Helling. 3500;938(18): 2061-2068  (http://education.[facility].com/faq/FAQ164)    LDLDIRECT 114.0 03/14/2019   TRIG 205.0 (H) 03/14/2019   CHOLHDL 4 03/14/2019   Lab Results  Component Value Date   WBC 7.5 05/22/2019   HGB 14.2 05/22/2019   HCT 42.1 05/22/2019   MCV 92 05/22/2019   PLT 225 05/22/2019   Attestation Statements:   Reviewed by clinician on day of visit: allergies, medications, problem list, medical history, surgical history, family history, social history, and previous encounter notes.  I, Water quality scientist, CMA, am acting as transcriptionist for Briscoe Deutscher, DO  I have reviewed the above documentation for accuracy and completeness, and I agree with the above. Briscoe Deutscher, DO

## 2019-08-12 ENCOUNTER — Other Ambulatory Visit (INDEPENDENT_AMBULATORY_CARE_PROVIDER_SITE_OTHER): Payer: BC Managed Care – PPO

## 2019-08-12 ENCOUNTER — Other Ambulatory Visit: Payer: Self-pay

## 2019-08-12 DIAGNOSIS — Z789 Other specified health status: Secondary | ICD-10-CM | POA: Diagnosis not present

## 2019-08-12 NOTE — Addendum Note (Signed)
Addended by: Varney Biles on: 08/12/2019 07:43 AM   Modules accepted: Orders

## 2019-08-13 ENCOUNTER — Encounter (INDEPENDENT_AMBULATORY_CARE_PROVIDER_SITE_OTHER): Payer: Self-pay | Admitting: Family Medicine

## 2019-08-13 LAB — SARS-COV-2 ANTIBODY(IGG)SPIKE,SEMI-QUANTITATIVE: SARS COV1 AB(IGG)SPIKE,SEMI QN: <1 index (ref <1.00)

## 2019-08-14 ENCOUNTER — Encounter: Payer: Self-pay | Admitting: Family Medicine

## 2019-08-28 ENCOUNTER — Ambulatory Visit (INDEPENDENT_AMBULATORY_CARE_PROVIDER_SITE_OTHER): Payer: BC Managed Care – PPO | Admitting: Family Medicine

## 2019-09-02 ENCOUNTER — Other Ambulatory Visit: Payer: Self-pay

## 2019-09-02 ENCOUNTER — Ambulatory Visit (INDEPENDENT_AMBULATORY_CARE_PROVIDER_SITE_OTHER): Payer: BC Managed Care – PPO | Admitting: Family Medicine

## 2019-09-02 ENCOUNTER — Encounter (INDEPENDENT_AMBULATORY_CARE_PROVIDER_SITE_OTHER): Payer: Self-pay | Admitting: Family Medicine

## 2019-09-02 VITALS — BP 96/65 | HR 82 | Temp 97.6°F | Ht 77.0 in | Wt 252.0 lb

## 2019-09-02 DIAGNOSIS — I10 Essential (primary) hypertension: Secondary | ICD-10-CM | POA: Diagnosis not present

## 2019-09-02 DIAGNOSIS — E669 Obesity, unspecified: Secondary | ICD-10-CM | POA: Diagnosis not present

## 2019-09-02 DIAGNOSIS — Z9189 Other specified personal risk factors, not elsewhere classified: Secondary | ICD-10-CM | POA: Diagnosis not present

## 2019-09-02 DIAGNOSIS — E559 Vitamin D deficiency, unspecified: Secondary | ICD-10-CM

## 2019-09-02 DIAGNOSIS — Z6830 Body mass index (BMI) 30.0-30.9, adult: Secondary | ICD-10-CM

## 2019-09-03 NOTE — Progress Notes (Signed)
Chief Complaint:   OBESITY Seth Morales is here to discuss his progress with his obesity treatment plan along with follow-up of his obesity related diagnoses. Seth Morales is on the Category 3 Plan and states he is following his eating plan approximately 90-95% of the time. Seth Morales states he is exercising with a Systems analyst for 30 minutes 1 time per week.  Today's visit was #: 6 Starting weight: 285 lbs Starting date: 05/22/2019 Today's weight: 252 lbs Today's date: 09/02/2019 Total lbs lost to date: 33 Total lbs lost since last in-office visit: 11  Interim History: Seth Morales voices that if he stays home it's significantly easier to follow the plan. He did drive thru last evening. He is starting to get hungry on the plan. Lately he hasn't been eating as much food. He has been logging almost all day every day. He is starting to get bored with dinner.  Subjective:   1. Essential hypertension Seth Morales's blood pressure is low today. He has felt very dizzy and lightheaded on numermous occasions. He is on amlodipine and losartan.  2. Vitamin D deficiency Seth Morales denies nausea, vomiting, or muscle weakness, but he notes fatigue. He is on Vit D 5,000 IU daily. Last Vit D level was >50.  3. At risk for heart disease Seth Morales is at a higher than average risk for cardiovascular disease due to obesity.   Assessment/Plan:   1. Essential hypertension Seth Morales is working on healthy weight loss and exercise to improve blood pressure control. We will watch for signs of hypotension as he continues his lifestyle modifications. Seth Morales agreed to stop amlodipine (pill is not scored). We will follow up on his blood pressure at his next appointment.  2. Vitamin D deficiency Low Vitamin D level contributes to fatigue and are associated with obesity, breast, and colon cancer. We will follow up on labs at his next lab draw. Seth Morales will follow-up for routine testing of Vitamin D, at least 2-3 times per year to  avoid over-replacement.  3. At risk for heart disease Seth Morales was given approximately 15 minutes of coronary artery disease prevention counseling today. He is 59 y.o. male and has risk factors for heart disease including obesity. We discussed intensive lifestyle modifications today with an emphasis on specific weight loss instructions and strategies.   Repetitive spaced learning was employed today to elicit superior memory formation and behavioral change.  4. Class 1 obesity with serious comorbidity and body mass index (BMI) of 30.0 to 30.9 in adult, unspecified obesity type Seth Morales is currently in the action stage of change. As such, his goal is to continue with weight loss efforts. He has agreed to the Category 3 Plan or keeping a food journal and adhering to recommended goals of 1450-1600 calories and 95+ grams of protein daily.   Exercise goals: No exercise has been prescribed at this time.  Behavioral modification strategies: increasing lean protein intake, increasing vegetables, meal planning and cooking strategies, keeping healthy foods in the home and planning for success.  Seth Morales has agreed to follow-up with our clinic in 2 to 3 weeks. He was informed of the importance of frequent follow-up visits to maximize his success with intensive lifestyle modifications for his multiple health conditions.   Objective:   Blood pressure 96/65, pulse 82, temperature 97.6 F (36.4 C), temperature source Oral, height 6\' 5"  (1.956 m), weight 252 lb (114.3 kg), SpO2 97 %. Body mass index is 29.88 kg/m.  General: Cooperative, alert, well developed, in no acute distress. HEENT: Conjunctivae  and lids unremarkable. Cardiovascular: Regular rhythm.  Lungs: Normal work of breathing. Neurologic: No focal deficits.   Lab Results  Component Value Date   CREATININE 1.18 05/22/2019   BUN 21 05/22/2019   NA 141 05/22/2019   K 4.5 05/22/2019   CL 102 05/22/2019   CO2 25 05/22/2019   Lab Results    Component Value Date   ALT 36 05/22/2019   AST 26 05/22/2019   ALKPHOS 80 05/22/2019   BILITOT 0.8 05/22/2019   Lab Results  Component Value Date   HGBA1C 5.4 05/22/2019   HGBA1C 5.4 11/30/2018   HGBA1C 5.6 07/12/2017   HGBA1C 5.2 11/07/2014   Lab Results  Component Value Date   INSULIN 14.6 05/22/2019   Lab Results  Component Value Date   TSH 2.970 05/22/2019   Lab Results  Component Value Date   CHOL 170 03/14/2019   HDL 39.70 03/14/2019   LDLCALC  11/30/2018     Comment:     . LDL cholesterol not calculated. Triglyceride levels greater than 400 mg/dL invalidate calculated LDL results. . Reference range: <100 . Desirable range <100 mg/dL for primary prevention;   <70 mg/dL for patients with CHD or diabetic patients  with > or = 2 CHD risk factors. Marland Kitchen LDL-C is now calculated using the Martin-Hopkins  calculation, which is a validated novel method providing  better accuracy than the Friedewald equation in the  estimation of LDL-C.  Horald Pollen et al. Lenox Ahr. 4332;951(88): 2061-2068  (http://education.[facility].com/faq/FAQ164)    LDLDIRECT 114.0 03/14/2019   TRIG 205.0 (H) 03/14/2019   CHOLHDL 4 03/14/2019   Lab Results  Component Value Date   WBC 7.5 05/22/2019   HGB 14.2 05/22/2019   HCT 42.1 05/22/2019   MCV 92 05/22/2019   PLT 225 05/22/2019   No results found for: IRON, TIBC, FERRITIN  Attestation Statements:   Reviewed by clinician on day of visit: allergies, medications, problem list, medical history, surgical history, family history, social history, and previous encounter notes.   I, Burt Knack, am acting as transcriptionist for Reuben Likes, MD.  I have reviewed the above documentation for accuracy and completeness, and I agree with the above. - Katherina Mires, MD

## 2019-09-05 ENCOUNTER — Encounter (INDEPENDENT_AMBULATORY_CARE_PROVIDER_SITE_OTHER): Payer: Self-pay | Admitting: Family Medicine

## 2019-09-16 ENCOUNTER — Ambulatory Visit (INDEPENDENT_AMBULATORY_CARE_PROVIDER_SITE_OTHER): Payer: BC Managed Care – PPO | Admitting: Family Medicine

## 2019-09-25 ENCOUNTER — Ambulatory Visit (INDEPENDENT_AMBULATORY_CARE_PROVIDER_SITE_OTHER): Payer: BC Managed Care – PPO | Admitting: Family Medicine

## 2019-09-25 ENCOUNTER — Other Ambulatory Visit: Payer: Self-pay

## 2019-09-25 ENCOUNTER — Encounter (INDEPENDENT_AMBULATORY_CARE_PROVIDER_SITE_OTHER): Payer: Self-pay | Admitting: Family Medicine

## 2019-09-25 VITALS — BP 112/71 | HR 67 | Temp 97.7°F | Ht 77.0 in | Wt 246.0 lb

## 2019-09-25 DIAGNOSIS — Z9189 Other specified personal risk factors, not elsewhere classified: Secondary | ICD-10-CM

## 2019-09-25 DIAGNOSIS — E65 Localized adiposity: Secondary | ICD-10-CM | POA: Diagnosis not present

## 2019-09-25 DIAGNOSIS — I10 Essential (primary) hypertension: Secondary | ICD-10-CM

## 2019-09-25 DIAGNOSIS — Q613 Polycystic kidney, unspecified: Secondary | ICD-10-CM | POA: Diagnosis not present

## 2019-09-25 DIAGNOSIS — E669 Obesity, unspecified: Secondary | ICD-10-CM

## 2019-09-25 DIAGNOSIS — E559 Vitamin D deficiency, unspecified: Secondary | ICD-10-CM

## 2019-09-25 DIAGNOSIS — D849 Immunodeficiency, unspecified: Secondary | ICD-10-CM

## 2019-09-25 DIAGNOSIS — Z6830 Body mass index (BMI) 30.0-30.9, adult: Secondary | ICD-10-CM

## 2019-09-26 NOTE — Progress Notes (Signed)
Chief Complaint:   OBESITY Seth Morales is here to discuss his progress with his obesity treatment plan along with follow-up of his obesity related diagnoses. Seth Morales is on the Category 3 Plan and states he is following his eating plan approximately 90% of the time. Seth Morales states he is walking 2-3 times per week.  Today's visit was #: 7 Starting weight: 285 lbs Starting date: 05/22/2019 Today's weight: 246 lbs Today's date: 09/25/2019 Total lbs lost to date: 39 lbs Total lbs lost since last in-office visit: 6 lbs  Interim History: Seth Morales is down 39 pounds today here, but 50 total pounds have been lost since January 2020.  He has been doing PT with Sharlee Blew, Exercise Physiologist at [facility].  Subjective:   1. Vitamin D deficiency Seth Morales's Vitamin D level was 03/14/2019 on 56.28. He is currently taking OTC vitamin D 5000 IU each day. He denies nausea, vomiting or muscle weakness.  2. Visceral obesity, with metabolic syndrome Seth Morales's visceral fat score is 16 today.    3. Essential hypertension Review: taking medications as instructed, no medication side effects noted, no chest pain on exertion, no dyspnea on exertion, no swelling of ankles.   Blood pressure is at goal.  He has had no hypotension.  BP Readings from Last 3 Encounters:  09/25/19 112/71  09/02/19 96/65  08/07/19 111/69   4. Polycystic kidney disease We reviewed his last visit with Nephrology.  He is doing well.  5. Immunocompromised (HCC) He is taking Cellcept and prednisone for donor kidney.  Assessment/Plan:   1. Vitamin D deficiency Low Vitamin D level contributes to fatigue and are associated with obesity, breast, and colon cancer. He agrees to continue to take prescription Vitamin D @50 ,000 IU every week and will follow-up for routine testing of Vitamin D, at least 2-3 times per year to avoid over-replacement.  2. Visceral obesity, with metabolic syndrome Consider Trulicity.  Counseling: Intensive  lifestyle modifications are the first line treatment for this issue. We discussed several lifestyle modifications today and he will continue to work on diet, exercise and weight loss efforts. We will continue to monitor. Orders and follow up as documented in patient record.  Counseling  Metabolic syndrome is a combination of conditions such as abdominal obesity, high blood sugar, high blood pressure, and unhealthy levels of cholesterol. Metabolic syndrome raises your risk of developing heart disease, diabetes, and stroke.  Following a heart-healthy diet can help you lower your risk of heart disease and improve metabolic syndrome. This diet includes lean proteins, healthy fats, nuts, and plenty of fruits and vegetables.  Along with regular exercise, following a healthy diet can help your body use insulin better and help you lose weight.  A common goal for people with metabolic syndrome is to lose 7-10% of their starting weight.  3. Essential hypertension Seth Morales is working on healthy weight loss and exercise to improve blood pressure control. We will watch for signs of hypotension as he continues his lifestyle modifications.  4. Polycystic kidney disease Seth Morales is doing well.    5. Immunocompromised (HCC) Reviewed COVID precautions/vaccine recommendations.  Seth Morales has already had 1 full COVID vaccine series.  6. At risk for heart disease Seth Morales was given approximately 15 minutes of coronary artery disease prevention counseling today. He is 59 y.o. male and has risk factors for heart disease including obesity. We discussed intensive lifestyle modifications today with an emphasis on specific weight loss instructions and strategies.   Repetitive spaced learning was employed today to  elicit superior memory formation and behavioral change.  7. Class 1 obesity with serious comorbidity and body mass index (BMI) of 30.0 to 30.9 in adult, unspecified obesity type Seth Morales is currently in the  action stage of change. As such, his goal is to continue with weight loss efforts. He has agreed to the Category 3 Plan.   Exercise goals: For substantial health benefits, adults should do at least 150 minutes (2 hours and 30 minutes) a week of moderate-intensity, or 75 minutes (1 hour and 15 minutes) a week of vigorous-intensity aerobic physical activity, or an equivalent combination of moderate- and vigorous-intensity aerobic activity. Aerobic activity should be performed in episodes of at least 10 minutes, and preferably, it should be spread throughout the week.  Behavioral modification strategies: increasing lean protein intake, decreasing sodium intake and increasing high fiber foods.  Seth Morales has agreed to follow-up with our clinic in 3 weeks. He was informed of the importance of frequent follow-up visits to maximize his success with intensive lifestyle modifications for his multiple health conditions.   Objective:   Blood pressure 112/71, pulse 67, temperature 97.7 F (36.5 C), temperature source Oral, height 6\' 5"  (1.956 m), weight 246 lb (111.6 kg), SpO2 95 %. Body mass index is 29.17 kg/m.  General: Cooperative, alert, well developed, in no acute distress. HEENT: Conjunctivae and lids unremarkable. Cardiovascular: Regular rhythm.  Lungs: Normal work of breathing. Neurologic: No focal deficits.   Lab Results  Component Value Date   CREATININE 1.18 05/22/2019   BUN 21 05/22/2019   NA 141 05/22/2019   K 4.5 05/22/2019   CL 102 05/22/2019   CO2 25 05/22/2019   Lab Results  Component Value Date   ALT 36 05/22/2019   AST 26 05/22/2019   ALKPHOS 80 05/22/2019   BILITOT 0.8 05/22/2019   Lab Results  Component Value Date   HGBA1C 5.4 05/22/2019   HGBA1C 5.4 11/30/2018   HGBA1C 5.6 07/12/2017   HGBA1C 5.2 11/07/2014   Lab Results  Component Value Date   INSULIN 14.6 05/22/2019   Lab Results  Component Value Date   TSH 2.970 05/22/2019   Lab Results  Component  Value Date   CHOL 170 03/14/2019   HDL 39.70 03/14/2019   LDLCALC  11/30/2018     Comment:     . LDL cholesterol not calculated. Triglyceride levels greater than 400 mg/dL invalidate calculated LDL results. . Reference range: <100 . Desirable range <100 mg/dL for primary prevention;   <70 mg/dL for patients with CHD or diabetic patients  with > or = 2 CHD risk factors. 12/02/2018 LDL-C is now calculated using the Martin-Hopkins  calculation, which is a validated novel method providing  better accuracy than the Friedewald equation in the  estimation of LDL-C.  Marland Kitchen et al. Horald Pollen. Lenox Ahr): 2061-2068  (http://education.[facility].com/faq/FAQ164)    LDLDIRECT 114.0 03/14/2019   TRIG 205.0 (H) 03/14/2019   CHOLHDL 4 03/14/2019   Lab Results  Component Value Date   WBC 7.5 05/22/2019   HGB 14.2 05/22/2019   HCT 42.1 05/22/2019   MCV 92 05/22/2019   PLT 225 05/22/2019   Attestation Statements:   Reviewed by clinician on day of visit: allergies, medications, problem list, medical history, surgical history, family history, social history, and previous encounter notes.  I, 05/24/2019, CMA, am acting as transcriptionist for Insurance claims handler, DO  I have reviewed the above documentation for accuracy and completeness, and I agree with the above. Helane Rima, DO

## 2019-10-16 ENCOUNTER — Ambulatory Visit (INDEPENDENT_AMBULATORY_CARE_PROVIDER_SITE_OTHER): Payer: BC Managed Care – PPO | Admitting: Family Medicine

## 2019-10-16 ENCOUNTER — Other Ambulatory Visit: Payer: Self-pay

## 2019-10-16 ENCOUNTER — Encounter (INDEPENDENT_AMBULATORY_CARE_PROVIDER_SITE_OTHER): Payer: Self-pay | Admitting: Family Medicine

## 2019-10-16 VITALS — BP 103/69 | HR 77 | Temp 98.0°F | Ht 77.0 in | Wt 243.0 lb

## 2019-10-16 DIAGNOSIS — Z79899 Other long term (current) drug therapy: Secondary | ICD-10-CM | POA: Diagnosis not present

## 2019-10-16 DIAGNOSIS — E559 Vitamin D deficiency, unspecified: Secondary | ICD-10-CM | POA: Diagnosis not present

## 2019-10-16 DIAGNOSIS — E65 Localized adiposity: Secondary | ICD-10-CM | POA: Diagnosis not present

## 2019-10-16 DIAGNOSIS — Z9189 Other specified personal risk factors, not elsewhere classified: Secondary | ICD-10-CM

## 2019-10-16 DIAGNOSIS — E669 Obesity, unspecified: Secondary | ICD-10-CM

## 2019-10-16 DIAGNOSIS — Z6830 Body mass index (BMI) 30.0-30.9, adult: Secondary | ICD-10-CM

## 2019-10-17 NOTE — Progress Notes (Signed)
Chief Complaint:   OBESITY Seth Morales is here to discuss his progress with his obesity treatment plan along with follow-up of his obesity related diagnoses. Seth Morales is on the Category 3 Plan and states he is following his eating plan approximately 80% of the time. Seth Morales states he is exercising for 0 minutes 0 times per week.  Today's visit was #: 8 Starting weight: 285 lbs Starting date: 05/22/2019 Today's weight: 243 lbs Today's date: 10/16/2019 Total lbs lost to date: 42 lbs Total lbs lost since last in-office visit: 3 lbs Total weight loss percentage to date: -14.74%  Interim History: Seth Morales says he is happy with his weight loss.  He has increased his exercise (went kayaking last week).  He got a new puppy, so he has not had any sleep for a few nights.  He is beginning college teaching.  He is worried about exposure risk.  Subjective:   1. Visceral obesity, with metabolic syndrome Risk factors (3 or more constitute Metabolic Syndrome): triglycerides > 150 impaired fasting blood sugar on blood pressure medication.  His visceral fat rating is 16.  2. Vitamin D deficiency Seth Morales Vitamin D level was 56.28 on 03/14/2019. He is currently taking OTC vitamin D 5000 IU each day. He denies nausea, vomiting or muscle weakness.  3. High risk medication use Seth Morales is immunosuppressed and is on cyclosporine, Cellcept, and prednisone.  He had a booster (3rd dose) of the COVID vaccine 2 weeks ago.  Assessment/Plan:   1. Visceral obesity, with metabolic syndrome Visceral adipose tissue is a hormonally active component of total body fat. This body composition phenotype is associated with medical disorders such as metabolic syndrome, cardiovascular disease and several malignancies including prostate, breast, and colorectal cancers. Goal: Lose 7-10% of starting weight. Visceral fat rating should be < 13.  2. Vitamin D deficiency Optimal goal > 50 ng/dL. There is also evidence to support a  goal of >70 ng/dL in patients with cancer and heart disease. Plan: Continue Vitamin D @50 ,000 IU every week with follow-up for routine testing of Vitamin D at least 2-3 times per year to avoid over-replacement.  3. High risk medication use Will continue to monitor symptoms as they relate to his weight loss journey.  4. At risk for heart disease Seth Morales was given approximately 15 minutes of coronary artery disease prevention counseling today. He is 59 y.o. male and has risk factors for heart disease including obesity and visceral obesity. We discussed intensive lifestyle modifications today with an emphasis on specific weight loss instructions and strategies.   Repetitive spaced learning was employed today to elicit superior memory formation and behavioral change.  5. Class 1 obesity with serious comorbidity and body mass index (BMI) of 30.0 to 30.9 in adult, unspecified obesity type Seth Morales is currently in the action stage of change. As such, his goal is to continue with weight loss efforts. He has agreed to the Category 3 Plan.   Exercise goals: For substantial health benefits, adults should do at least 150 minutes (2 hours and 30 minutes) a week of moderate-intensity, or 75 minutes (1 hour and 15 minutes) a week of vigorous-intensity aerobic physical activity, or an equivalent combination of moderate- and vigorous-intensity aerobic activity. Aerobic activity should be performed in episodes of at least 10 minutes, and preferably, it should be spread throughout the week.  Behavioral modification strategies: increasing lean protein intake and increasing water intake.  Seth Morales has agreed to follow-up with our clinic in 3-4 weeks. He was informed  of the importance of frequent follow-up visits to maximize his success with intensive lifestyle modifications for his multiple health conditions.   Objective:   Blood pressure 103/69, pulse 77, temperature 98 F (36.7 C), temperature source Oral, height  6\' 5"  (1.956 m), weight 243 lb (110.2 kg), SpO2 96 %. Body mass index is 28.82 kg/m.  General: Cooperative, alert, well developed, in no acute distress. HEENT: Conjunctivae and lids unremarkable. Cardiovascular: Regular rhythm.  Lungs: Normal work of breathing. Neurologic: No focal deficits.   Lab Results  Component Value Date   CREATININE 1.18 05/22/2019   BUN 21 05/22/2019   NA 141 05/22/2019   K 4.5 05/22/2019   CL 102 05/22/2019   CO2 25 05/22/2019   Lab Results  Component Value Date   ALT 36 05/22/2019   AST 26 05/22/2019   ALKPHOS 80 05/22/2019   BILITOT 0.8 05/22/2019   Lab Results  Component Value Date   HGBA1C 5.4 05/22/2019   HGBA1C 5.4 11/30/2018   HGBA1C 5.6 07/12/2017   HGBA1C 5.2 11/07/2014   Lab Results  Component Value Date   INSULIN 14.6 05/22/2019   Lab Results  Component Value Date   TSH 2.970 05/22/2019   Lab Results  Component Value Date   CHOL 170 03/14/2019   HDL 39.70 03/14/2019   LDLCALC  11/30/2018     Comment:     . LDL cholesterol not calculated. Triglyceride levels greater than 400 mg/dL invalidate calculated LDL results. . Reference range: <100 . Desirable range <100 mg/dL for primary prevention;   <70 mg/dL for patients with CHD or diabetic patients  with > or = 2 CHD risk factors. 12/02/2018 LDL-C is now calculated using the Martin-Hopkins  calculation, which is a validated novel method providing  better accuracy than the Friedewald equation in the  estimation of LDL-C.  Marland Kitchen et al. Horald Pollen. Lenox Ahr): 2061-2068  (http://education.[facility].com/faq/FAQ164)    LDLDIRECT 114.0 03/14/2019   TRIG 205.0 (H) 03/14/2019   CHOLHDL 4 03/14/2019   Lab Results  Component Value Date   WBC 7.5 05/22/2019   HGB 14.2 05/22/2019   HCT 42.1 05/22/2019   MCV 92 05/22/2019   PLT 225 05/22/2019   Attestation Statements:   Reviewed by clinician on day of visit: allergies, medications, problem list, medical history, surgical  history, family history, social history, and previous encounter notes.  I, 05/24/2019, CMA, am acting as transcriptionist for Insurance claims handler, DO  I have reviewed the above documentation for accuracy and completeness, and I agree with the above. Helane Rima, DO

## 2019-10-21 ENCOUNTER — Encounter (INDEPENDENT_AMBULATORY_CARE_PROVIDER_SITE_OTHER): Payer: Self-pay | Admitting: Family Medicine

## 2019-10-21 ENCOUNTER — Telehealth: Payer: Self-pay | Admitting: Family Medicine

## 2019-10-21 DIAGNOSIS — Z0184 Encounter for antibody response examination: Secondary | ICD-10-CM

## 2019-10-21 NOTE — Telephone Encounter (Signed)
Please advise 

## 2019-10-21 NOTE — Telephone Encounter (Signed)
Patient called and wanted to see if Dr. Salena Saner can write an order to get an covid antibody test. Informed patient that Dr. Salena Saner is out of the office and request may not be answered until she returns. Patient stated that he was ok with waiting until Dr. Salena Saner returns. CB is [phone]

## 2019-10-23 ENCOUNTER — Other Ambulatory Visit: Payer: Self-pay

## 2019-10-23 NOTE — Telephone Encounter (Signed)
Attempted to contact patient to schedule lab appt. Left message with contact info.

## 2019-10-23 NOTE — Telephone Encounter (Signed)
Lab order placed. Please call pt to make him aware and have him schedule lab appt

## 2019-10-24 ENCOUNTER — Other Ambulatory Visit: Payer: BC Managed Care – PPO

## 2019-10-24 DIAGNOSIS — Z0184 Encounter for antibody response examination: Secondary | ICD-10-CM

## 2019-10-25 ENCOUNTER — Encounter (INDEPENDENT_AMBULATORY_CARE_PROVIDER_SITE_OTHER): Payer: Self-pay | Admitting: Family Medicine

## 2019-10-25 ENCOUNTER — Encounter: Payer: Self-pay | Admitting: Family Medicine

## 2019-10-25 DIAGNOSIS — Z0184 Encounter for antibody response examination: Secondary | ICD-10-CM

## 2019-10-25 LAB — SARS-COV-2 ANTIBODIES: SARS-CoV-2 Antibodies: NEGATIVE

## 2019-10-25 NOTE — Telephone Encounter (Signed)
Noted lab was completed on 10/24/2019. Closing encounter as patient request has been completed.

## 2019-10-28 NOTE — Telephone Encounter (Signed)
Pt notified per Dr [name], okay to receive 4th covid vaccine today

## 2019-11-13 ENCOUNTER — Encounter (INDEPENDENT_AMBULATORY_CARE_PROVIDER_SITE_OTHER): Payer: Self-pay | Admitting: Family Medicine

## 2019-11-13 ENCOUNTER — Other Ambulatory Visit (INDEPENDENT_AMBULATORY_CARE_PROVIDER_SITE_OTHER): Payer: Self-pay | Admitting: Family Medicine

## 2019-11-13 ENCOUNTER — Ambulatory Visit (INDEPENDENT_AMBULATORY_CARE_PROVIDER_SITE_OTHER): Payer: BC Managed Care – PPO | Admitting: Family Medicine

## 2019-11-13 ENCOUNTER — Other Ambulatory Visit: Payer: Self-pay

## 2019-11-13 VITALS — BP 108/70 | HR 59 | Temp 97.5°F | Ht 77.0 in | Wt 234.0 lb

## 2019-11-13 DIAGNOSIS — I10 Essential (primary) hypertension: Secondary | ICD-10-CM

## 2019-11-13 DIAGNOSIS — E65 Localized adiposity: Secondary | ICD-10-CM

## 2019-11-13 DIAGNOSIS — Z0184 Encounter for antibody response examination: Secondary | ICD-10-CM

## 2019-11-13 DIAGNOSIS — D849 Immunodeficiency, unspecified: Secondary | ICD-10-CM

## 2019-11-13 DIAGNOSIS — E663 Overweight: Secondary | ICD-10-CM

## 2019-11-13 DIAGNOSIS — Z6827 Body mass index (BMI) 27.0-27.9, adult: Secondary | ICD-10-CM

## 2019-11-14 LAB — SARS-COV-2 SEMI-QUANTITATIVE TOTAL ANTIBODY, SPIKE
SARS-CoV-2 Semi-Quant Total Ab: >2500 U/mL (ref <0.8)
SARS-CoV-2 Spike Ab Interp: POSITIVE

## 2019-11-17 NOTE — Progress Notes (Signed)
Chief Complaint:   OBESITY Seth Morales is here to discuss his progress with his obesity treatment plan along with follow-up of his obesity related diagnoses. Seth Morales is on the Category 3 Plan and states he is following his eating plan approximately 80% of the time. Seth Morales states he is walking his puppy several times per day.  Total lbs lost since last in-office visit: 9  Interim History: Seth Morales is still very happy with his plan. He wants to continue to lose more weight to reduce abdominal fat. He is now down a total of 51 pounds, which is 20.68% total weight loss. He is now wearing size 26 pants. Recent labs with his Nephrologist showed creatinine 1.29. He feels that he is diuresing more easily, resulting in low blood pressures with intermittent orthostatic symptoms.   Assessment/Plan:   1. Immunity status testing Immunocompromised and recently received booster.   - SARS-CoV-2 Semi-Quantitative Total Antibody, Spike  2. Visceral obesity, with metabolic syndrome Goal: Lose 7-10% of starting weight. Counseling: Intensive lifestyle modifications are the first line treatment for this issue. We discussed several lifestyle modifications today and he will continue to work on diet, exercise and weight loss efforts.   3. Essential hypertension We have already decreased Seth Morales's Losartan. We previously attempted to stop his HCTZ but this resulted in rapid water weight gain. He is currently taking HCTZ qod. Will stretch to q 3-4 days or prn. Red flags reviewed.  4. Immunocompromised (HCC) Donor kidney 2/2 polycystic kidney disease, on Cellcept and prednisone. At risk for COVID complications.   5. Overweight with body mass index (BMI) of 27 to 27.9 in adult The current medical regimen is effective;  continue present plan and medications. We discussed the importance of strength training at this point. We also discussed adding calories to slow weight loss.   Seth Morales is currently in the action stage  of change. As such, his goal is to continue with weight loss efforts. He has agreed to the Category 3 Plan.   Exercise goals: For substantial health benefits, adults should do at least 150 minutes (2 hours and 30 minutes) a week of moderate-intensity, or 75 minutes (1 hour and 15 minutes) a week of vigorous-intensity aerobic physical activity, or an equivalent combination of moderate- and vigorous-intensity aerobic activity. Aerobic activity should be performed in episodes of at least 10 minutes, and preferably, it should be spread throughout the week. Adults should also include muscle-strengthening activities that involve all major muscle groups on 2 or more days a week.  Behavioral modification strategies: increasing lean protein intake.  Seth Morales has agreed to follow-up with our clinic in 3 weeks. He was informed of the importance of frequent follow-up visits to maximize his success with intensive lifestyle modifications for his multiple health conditions.   Objective:   Blood pressure 108/70, pulse (!) 59, temperature (!) 97.5 F (36.4 C), temperature source Oral, height 6\' 5"  (1.956 m), weight 234 lb (106.1 kg), SpO2 99 %. Body mass index is 27.75 kg/m.  General: Cooperative, alert, well developed, in no acute distress. HEENT: Conjunctivae and lids unremarkable. Cardiovascular: Regular rhythm.  Lungs: Normal work of breathing. Neurologic: No focal deficits.   Lab Results  Component Value Date   CREATININE 1.18 05/22/2019   BUN 21 05/22/2019   NA 141 05/22/2019   K 4.5 05/22/2019   CL 102 05/22/2019   CO2 25 05/22/2019   Lab Results  Component Value Date   ALT 36 05/22/2019   AST 26 05/22/2019  ALKPHOS 80 05/22/2019   BILITOT 0.8 05/22/2019   Lab Results  Component Value Date   HGBA1C 5.4 05/22/2019   HGBA1C 5.4 11/30/2018   HGBA1C 5.6 07/12/2017   HGBA1C 5.2 11/07/2014   Lab Results  Component Value Date   INSULIN 14.6 05/22/2019   Lab Results  Component Value  Date   TSH 2.970 05/22/2019   Lab Results  Component Value Date   CHOL 170 03/14/2019   HDL 39.70 03/14/2019   LDLCALC  11/30/2018     Comment:     . LDL cholesterol not calculated. Triglyceride levels greater than 400 mg/dL invalidate calculated LDL results. . Reference range: <100 . Desirable range <100 mg/dL for primary prevention;   <70 mg/dL for patients with CHD or diabetic patients  with > or = 2 CHD risk factors. Marland Kitchen LDL-C is now calculated using the Martin-Hopkins  calculation, which is a validated novel method providing  better accuracy than the Friedewald equation in the  estimation of LDL-C.  Horald Pollen et al. Lenox Ahr. 8295;621(30): 2061-2068  (http://education.[facility].com/faq/FAQ164)    LDLDIRECT 114.0 03/14/2019   TRIG 205.0 (H) 03/14/2019   CHOLHDL 4 03/14/2019   Lab Results  Component Value Date   WBC 7.5 05/22/2019   HGB 14.2 05/22/2019   HCT 42.1 05/22/2019   MCV 92 05/22/2019   PLT 225 05/22/2019   Attestation Statements:   Reviewed by clinician on day of visit: allergies, medications, problem list, medical history, surgical history, family history, social history, and previous encounter notes.  Time spent on visit including pre-visit chart review and post-visit care and charting was 40 minutes.

## 2019-12-02 ENCOUNTER — Encounter: Payer: Self-pay | Admitting: Family Medicine

## 2019-12-02 DIAGNOSIS — Z1211 Encounter for screening for malignant neoplasm of colon: Secondary | ICD-10-CM

## 2019-12-03 ENCOUNTER — Telehealth: Payer: Self-pay | Admitting: Gastroenterology

## 2019-12-03 NOTE — Telephone Encounter (Signed)
Hey Dr Lavon Paganini, this pt is being referred to [place] by [facility] for a colonoscopy but it looks like he had one done in 2019. Records are in epic for review. Please advise on scheduling

## 2019-12-03 NOTE — Telephone Encounter (Signed)
Incomplete colonoscopy due to poor prep and tortuous redundant colon, please schedule follow up visit, next available to discuss repeat colonoscopy vs CT colography for colorectal cancer screening. Thanks

## 2019-12-16 ENCOUNTER — Ambulatory Visit (INDEPENDENT_AMBULATORY_CARE_PROVIDER_SITE_OTHER): Payer: BC Managed Care – PPO | Admitting: Family Medicine

## 2019-12-18 ENCOUNTER — Ambulatory Visit (INDEPENDENT_AMBULATORY_CARE_PROVIDER_SITE_OTHER): Payer: BC Managed Care – PPO | Admitting: Family Medicine

## 2019-12-19 ENCOUNTER — Ambulatory Visit (INDEPENDENT_AMBULATORY_CARE_PROVIDER_SITE_OTHER): Payer: BC Managed Care – PPO | Admitting: Family Medicine

## 2019-12-19 ENCOUNTER — Encounter (INDEPENDENT_AMBULATORY_CARE_PROVIDER_SITE_OTHER): Payer: Self-pay | Admitting: Family Medicine

## 2019-12-19 ENCOUNTER — Other Ambulatory Visit: Payer: Self-pay

## 2019-12-19 VITALS — BP 108/69 | HR 62 | Temp 97.8°F | Ht 77.0 in | Wt 226.0 lb

## 2019-12-19 DIAGNOSIS — E8881 Metabolic syndrome: Secondary | ICD-10-CM | POA: Diagnosis not present

## 2019-12-19 DIAGNOSIS — E559 Vitamin D deficiency, unspecified: Secondary | ICD-10-CM

## 2019-12-19 DIAGNOSIS — E669 Obesity, unspecified: Secondary | ICD-10-CM

## 2019-12-19 DIAGNOSIS — Q613 Polycystic kidney, unspecified: Secondary | ICD-10-CM | POA: Diagnosis not present

## 2019-12-19 DIAGNOSIS — Z6830 Body mass index (BMI) 30.0-30.9, adult: Secondary | ICD-10-CM

## 2019-12-23 NOTE — Progress Notes (Signed)
Chief Complaint:   OBESITY Seth Morales is here to discuss his progress with his obesity treatment plan along with follow-up of his obesity related diagnoses. Seth Morales is on the Category 3 Plan and states he is following his eating plan approximately 75% of the time. Seth Morales states he is exercising for 0 minutes 0 times per week.  Today's visit was #:  10  Starting weight: 285 lbs Starting date: 05/22/2019 Today's weight: 226 lbs Today's date: 12/19/2019 Total lbs lost to date: 59 lbs Total lbs lost since last in-office visit: 8 lbs Total weight loss percentage to date: -20.70%  Interim History: Seth Morales endorses increased hunger.  His average calorie intake is 1500-2000 calories per day with 166 grams of protein.  Labs and IC at next visit.  Assessment/Plan:   1. Vitamin D deficiency Current vitamin D is 56.28, tested on 03/14/2019. Not at goal. Optimal goal > 50 ng/dL.   Plan: Continue Vitamin D @50 ,000 IU every week with follow-up for routine testing of Vitamin D at least 2-3 times per year to avoid over-replacement.  2. Metabolic syndrome Doing very well. He will continue to focus on protein-rich, low simple carbohydrate foods. We reviewed the importance of hydration, regular exercise for stress reduction, and restorative sleep.  We will continue to check lab work every 3 months, with 10% weight loss, or should any other concerns arise.  3. Polycystic kidney disease Seth Morales is followed by Nephrology for this.  He is doing well at this time.  We will continue to monitor symptoms as they relate to his weight loss journey.  4. Class 1 obesity with serious comorbidity and body mass index (BMI) of 30.0 to 30.9 in adult, unspecified obesity type  Seth Morales is currently in the action stage of change. As such, his goal is to continue with weight loss efforts. He has agreed to the Category 3 Plan.   Exercise goals: For substantial health benefits, adults should do at least 150 minutes (2  hours and 30 minutes) a week of moderate-intensity, or 75 minutes (1 hour and 15 minutes) a week of vigorous-intensity aerobic physical activity, or an equivalent combination of moderate- and vigorous-intensity aerobic activity. Aerobic activity should be performed in episodes of at least 10 minutes, and preferably, it should be spread throughout the week.  Behavioral modification strategies: increasing lean protein intake, decreasing simple carbohydrates, increasing vegetables and increasing water intake.  Seth Morales has agreed to follow-up with our clinic in 3 weeks. He was informed of the importance of frequent follow-up visits to maximize his success with intensive lifestyle modifications for his multiple health conditions.   Objective:   Blood pressure 108/69, pulse 62, temperature 97.8 F (36.6 C), temperature source Oral, height 6\' 5"  (1.956 m), weight 226 lb (102.5 kg), SpO2 99 %. Body mass index is 26.8 kg/m.  General: Cooperative, alert, well developed, in no acute distress. HEENT: Conjunctivae and lids unremarkable. Cardiovascular: Regular rhythm.  Lungs: Normal work of breathing. Neurologic: No focal deficits.   Lab Results  Component Value Date   CREATININE 1.18 05/22/2019   BUN 21 05/22/2019   NA 141 05/22/2019   K 4.5 05/22/2019   CL 102 05/22/2019   CO2 25 05/22/2019   Lab Results  Component Value Date   ALT 36 05/22/2019   AST 26 05/22/2019   ALKPHOS 80 05/22/2019   BILITOT 0.8 05/22/2019   Lab Results  Component Value Date   HGBA1C 5.4 05/22/2019   HGBA1C 5.4 11/30/2018   HGBA1C 5.6  07/12/2017   HGBA1C 5.2 11/07/2014   Lab Results  Component Value Date   INSULIN 14.6 05/22/2019   Lab Results  Component Value Date   TSH 2.970 05/22/2019   Lab Results  Component Value Date   CHOL 170 03/14/2019   HDL 39.70 03/14/2019   LDLCALC  11/30/2018     Comment:     . LDL cholesterol not calculated. Triglyceride levels greater than 400 mg/dL invalidate  calculated LDL results. . Reference range: <100 . Desirable range <100 mg/dL for primary prevention;   <70 mg/dL for patients with CHD or diabetic patients  with > or = 2 CHD risk factors. Marland Kitchen LDL-C is now calculated using the Martin-Hopkins  calculation, which is a validated novel method providing  better accuracy than the Friedewald equation in the  estimation of LDL-C.  Horald Pollen et al. Lenox Ahr. 1950;932(67): 2061-2068  (http://education.[facility].com/faq/FAQ164)    LDLDIRECT 114.0 03/14/2019   TRIG 205.0 (H) 03/14/2019   CHOLHDL 4 03/14/2019   Lab Results  Component Value Date   WBC 7.5 05/22/2019   HGB 14.2 05/22/2019   HCT 42.1 05/22/2019   MCV 92 05/22/2019   PLT 225 05/22/2019   Attestation Statements:   Reviewed by clinician on day of visit: allergies, medications, problem list, medical history, surgical history, family history, social history, and previous encounter notes.  I, Insurance claims handler, CMA, am acting as transcriptionist for Helane Rima, DO  I have reviewed the above documentation for accuracy and completeness, and I agree with the above. Helane Rima, DO

## 2020-01-20 ENCOUNTER — Ambulatory Visit (INDEPENDENT_AMBULATORY_CARE_PROVIDER_SITE_OTHER): Payer: BC Managed Care – PPO | Admitting: Family Medicine

## 2020-01-20 ENCOUNTER — Encounter (INDEPENDENT_AMBULATORY_CARE_PROVIDER_SITE_OTHER): Payer: Self-pay | Admitting: Family Medicine

## 2020-01-20 ENCOUNTER — Other Ambulatory Visit: Payer: Self-pay

## 2020-01-20 VITALS — BP 132/81 | HR 60 | Temp 97.6°F | Ht 77.0 in | Wt 222.0 lb

## 2020-01-20 DIAGNOSIS — R5383 Other fatigue: Secondary | ICD-10-CM

## 2020-01-20 DIAGNOSIS — Z6830 Body mass index (BMI) 30.0-30.9, adult: Secondary | ICD-10-CM

## 2020-01-20 DIAGNOSIS — Q613 Polycystic kidney, unspecified: Secondary | ICD-10-CM | POA: Diagnosis not present

## 2020-01-20 DIAGNOSIS — E8881 Metabolic syndrome: Secondary | ICD-10-CM | POA: Diagnosis not present

## 2020-01-20 DIAGNOSIS — E559 Vitamin D deficiency, unspecified: Secondary | ICD-10-CM | POA: Diagnosis not present

## 2020-01-20 DIAGNOSIS — E65 Localized adiposity: Secondary | ICD-10-CM

## 2020-01-20 DIAGNOSIS — E669 Obesity, unspecified: Secondary | ICD-10-CM

## 2020-01-20 DIAGNOSIS — E66811 Obesity, class 1: Secondary | ICD-10-CM

## 2020-01-20 NOTE — Progress Notes (Signed)
Chief Complaint:   OBESITY Seth Morales is here to discuss his progress with his obesity treatment plan along with follow-up of his obesity related diagnoses.   Today's visit was #: 11 Starting weight: 285 lbs Starting date: 05/22/2019 Today's weight: 222 lbs Today's date: 01/20/2020 Total lbs lost to date: 63 lbs Body mass index is 26.33 kg/m.  Total weight loss percentage to date: -22.11%  Interim History: Seth Morales says he has been stress eating.  He had a rough week.  He reports sleeping well.  Nutrition Plan: the Category 3 Plan.  Anti-obesity medications: None. Activity: None. Sleep: Sleep is restful.  Stress: Elevated.  Assessment/Plan:   1. Vitamin D deficiency Not optimized. Current vitamin D is 56.28, tested on 03/14/2019. Optimal goal > 50 ng/dL.  Taking OTC vitamin D 5,000 IU daily.  Plan:  []   Continue Vitamin D @50 ,000 IU every week. [x]   Continue home supplement daily. [x]   Follow-up for routine testing of Vitamin D at least 2-3 times per year to avoid over-replacement.  - VITAMIN D 25 Hydroxy (Vit-D Deficiency, Fractures)  2. Metabolic syndrome Starting goal: Lose 7-10% of starting weight. He will continue to focus on protein-rich, low simple carbohydrate foods. We reviewed the importance of hydration, regular exercise for stress reduction, and restorative sleep.  We will continue to check lab work every 3 months, with 10% weight loss, or should any other concerns arise.  - Comprehensive metabolic panel - Insulin, random - Lipid panel  3. Polycystic kidney disease Seth Morales is followed by Nephrology for this.  He is doing well at this time.  We will continue to monitor symptoms as they relate to his weight loss journey.  - CBC with Differential/Platelet - Comprehensive metabolic panel  4. Visceral obesity At goal.  Current visceral fat rating: 13. Visceral fat rating should be < 13. Visceral adipose tissue is a hormonally active component of total body  fat. This body composition phenotype is associated with medical disorders such as metabolic syndrome, cardiovascular disease and several malignancies including prostate, breast, and colorectal cancers. Starting goal: Lose 7-10% of starting weight.   - Comprehensive metabolic panel - Lipid panel  5. Class 1 obesity with serious comorbidity and body mass index (BMI) of 30.0 to 30.9 in adult, unspecified obesity type  Course: Seth Morales is currently in the action stage of change. As such, his goal is to continue with weight loss efforts.   Nutrition goals: He has agreed to the Category 3 Plan.   Exercise goals: For substantial health benefits, adults should do at least 150 minutes (2 hours and 30 minutes) a week of moderate-intensity, or 75 minutes (1 hour and 15 minutes) a week of vigorous-intensity aerobic physical activity, or an equivalent combination of moderate- and vigorous-intensity aerobic activity. Aerobic activity should be performed in episodes of at least 10 minutes, and preferably, it should be spread throughout the week.  Behavioral modification strategies: increasing lean protein intake, decreasing simple carbohydrates, increasing vegetables and increasing water intake.  Seth Morales has agreed to follow-up with our clinic in 3-4 weeks. He was informed of the importance of frequent follow-up visits to maximize his success with intensive lifestyle modifications for his multiple health conditions.   Objective:   Blood pressure 132/81, pulse 60, temperature 97.6 F (36.4 C), height 6\' 5"  (1.956 m), weight 222 lb (100.7 kg), SpO2 98 %. Body mass index is 26.33 kg/m.  General: Cooperative, alert, well developed, in no acute distress. HEENT: Conjunctivae and lids unremarkable. Cardiovascular: Regular  rhythm.  Lungs: Normal work of breathing. Neurologic: No focal deficits.   Lab Results  Component Value Date   CREATININE 1.02 01/20/2020   BUN 26 (H) 01/20/2020   NA 143 01/20/2020   K  3.8 01/20/2020   CL 99 01/20/2020   CO2 30 (H) 01/20/2020   Lab Results  Component Value Date   ALT 39 01/20/2020   AST 27 01/20/2020   ALKPHOS 74 01/20/2020   BILITOT 1.0 01/20/2020   Lab Results  Component Value Date   HGBA1C 5.4 05/22/2019   HGBA1C 5.4 11/30/2018   HGBA1C 5.6 07/12/2017   HGBA1C 5.2 11/07/2014   Lab Results  Component Value Date   INSULIN 6.0 01/20/2020   INSULIN 14.6 05/22/2019   Lab Results  Component Value Date   TSH 2.970 05/22/2019   Lab Results  Component Value Date   CHOL 161 01/20/2020   HDL 47 01/20/2020   LDLCALC 89 01/20/2020   LDLDIRECT 114.0 03/14/2019   TRIG 144 01/20/2020   CHOLHDL 3.4 01/20/2020   Lab Results  Component Value Date   WBC 6.4 01/20/2020   HGB 14.8 01/20/2020   HCT 45.5 01/20/2020   MCV 93 01/20/2020   PLT 177 01/20/2020   Attestation Statements:   Reviewed by clinician on day of visit: allergies, medications, problem list, medical history, surgical history, family history, social history, and previous encounter notes.  I, Insurance claims handler, CMA, am acting as transcriptionist for Helane Rima, DO  I have reviewed the above documentation for accuracy and completeness, and I agree with the above. Helane Rima, DO

## 2020-01-21 LAB — CBC WITH DIFFERENTIAL/PLATELET
Basophils Absolute: 0 10*3/uL (ref 0.0–0.2)
Basos: 1 %
EOS (ABSOLUTE): 0.1 10*3/uL (ref 0.0–0.4)
Eos: 2 %
Hematocrit: 45.5 % (ref 37.5–51.0)
Hemoglobin: 14.8 g/dL (ref 13.0–17.7)
Immature Grans (Abs): 0 10*3/uL (ref 0.0–0.1)
Immature Granulocytes: 0 %
Lymphocytes Absolute: 1.8 10*3/uL (ref 0.7–3.1)
Lymphs: 28 %
MCH: 30.1 pg (ref 26.6–33.0)
MCHC: 32.5 g/dL (ref 31.5–35.7)
MCV: 93 fL (ref 79–97)
Monocytes Absolute: 0.6 10*3/uL (ref 0.1–0.9)
Monocytes: 9 %
Neutrophils Absolute: 3.8 10*3/uL (ref 1.4–7.0)
Neutrophils: 60 %
Platelets: 177 10*3/uL (ref 150–450)
RBC: 4.92 x10E6/uL (ref 4.14–5.80)
RDW: 12.7 % (ref 11.6–15.4)
WBC: 6.4 10*3/uL (ref 3.4–10.8)

## 2020-01-21 LAB — COMPREHENSIVE METABOLIC PANEL
ALT: 39 IU/L (ref 0–44)
AST: 27 IU/L (ref 0–40)
Albumin/Globulin Ratio: 1.8 (ref 1.2–2.2)
Albumin: 4 g/dL (ref 3.8–4.9)
Alkaline Phosphatase: 74 IU/L (ref 44–121)
BUN/Creatinine Ratio: 25 — ABNORMAL HIGH (ref 9–20)
BUN: 26 mg/dL — ABNORMAL HIGH (ref 6–24)
Bilirubin Total: 1 mg/dL (ref 0.0–1.2)
CO2: 30 mmol/L — ABNORMAL HIGH (ref 20–29)
Calcium: 9.2 mg/dL (ref 8.7–10.2)
Chloride: 99 mmol/L (ref 96–106)
Creatinine, Ser: 1.02 mg/dL (ref 0.76–1.27)
GFR calc Af Amer: 93 mL/min/{1.73_m2} (ref >59)
GFR calc non Af Amer: 81 mL/min/{1.73_m2} (ref >59)
Globulin, Total: 2.2 g/dL (ref 1.5–4.5)
Glucose: 90 mg/dL (ref 65–99)
Potassium: 3.8 mmol/L (ref 3.5–5.2)
Sodium: 143 mmol/L (ref 134–144)
Total Protein: 6.2 g/dL (ref 6.0–8.5)

## 2020-01-21 LAB — LIPID PANEL
Chol/HDL Ratio: 3.4 ratio (ref 0.0–5.0)
Cholesterol, Total: 161 mg/dL (ref 100–199)
HDL: 47 mg/dL (ref >39)
LDL Chol Calc (NIH): 89 mg/dL (ref 0–99)
Triglycerides: 144 mg/dL (ref 0–149)
VLDL Cholesterol Cal: 25 mg/dL (ref 5–40)

## 2020-01-21 LAB — VITAMIN B12: Vitamin B-12: 709 pg/mL (ref 232–1245)

## 2020-01-21 LAB — INSULIN, RANDOM: INSULIN: 6 u[IU]/mL (ref 2.6–24.9)

## 2020-01-21 LAB — VITAMIN D 25 HYDROXY (VIT D DEFICIENCY, FRACTURES): Vit D, 25-Hydroxy: 96.6 ng/mL (ref 30.0–100.0)

## 2020-02-03 ENCOUNTER — Encounter: Payer: Self-pay | Admitting: Family Medicine

## 2020-02-04 NOTE — Telephone Encounter (Signed)
Please see message and advise.  Thank you. ° °

## 2020-02-10 ENCOUNTER — Other Ambulatory Visit: Payer: Self-pay

## 2020-02-10 ENCOUNTER — Ambulatory Visit (INDEPENDENT_AMBULATORY_CARE_PROVIDER_SITE_OTHER): Payer: BC Managed Care – PPO | Admitting: Family Medicine

## 2020-02-10 ENCOUNTER — Encounter (INDEPENDENT_AMBULATORY_CARE_PROVIDER_SITE_OTHER): Payer: Self-pay | Admitting: Family Medicine

## 2020-02-10 VITALS — BP 119/76 | HR 72 | Temp 98.1°F | Ht 77.0 in | Wt 226.0 lb

## 2020-02-10 DIAGNOSIS — E559 Vitamin D deficiency, unspecified: Secondary | ICD-10-CM

## 2020-02-10 DIAGNOSIS — E8881 Metabolic syndrome: Secondary | ICD-10-CM

## 2020-02-10 DIAGNOSIS — I10 Essential (primary) hypertension: Secondary | ICD-10-CM | POA: Diagnosis not present

## 2020-02-10 DIAGNOSIS — E65 Localized adiposity: Secondary | ICD-10-CM

## 2020-02-10 DIAGNOSIS — Z9189 Other specified personal risk factors, not elsewhere classified: Secondary | ICD-10-CM

## 2020-02-10 DIAGNOSIS — Z6830 Body mass index (BMI) 30.0-30.9, adult: Secondary | ICD-10-CM

## 2020-02-10 DIAGNOSIS — E669 Obesity, unspecified: Secondary | ICD-10-CM

## 2020-02-11 NOTE — Progress Notes (Signed)
Chief Complaint:   OBESITY Seth Morales is here to discuss his progress with his obesity treatment plan along with follow-up of his obesity related diagnoses.   Today's visit was #: 12 Starting weight: 285 lbs Starting date: 05/22/2019 Today's weight: 226 lbs Today's date: 02/10/2020 Total lbs lost to date: 59 lbs Body mass index is 26.8 kg/m.  Total weight loss percentage to date: -20.70%  Interim History: Seth Morales says that he has been hungry at night, but is good during the day.  He has been eating breakfast and lunch on plan.  CAVA for dinner sometimes.  Today's bioimpedance results indicate that Seth Morales has gained 4 pounds of water weight since his last visit. Nutrition Plan: the Category 3 Plan for 20% of the time.  Hunger is poorly controlled at night.  Activity: Walking 2-2.5 miles 3-4 times per week.  Assessment/Plan:   1. Metabolic syndrome Discussed labs with patient today.  Improving.  Starting goal: Lose 7-10% of starting weight. He will continue to focus on protein-rich, low simple carbohydrate foods. We reviewed the importance of hydration, regular exercise for stress reduction, and restorative sleep.  We will continue to check lab work every 3 months, with 10% weight loss, or should any other concerns arise.  The 10-year ASCVD risk score Denman George DC Montez Hageman., et al., 2013) is: 7.8%   Values used to calculate the score:     Age: 59 years     Sex: Male     Is Non-Hispanic African American: No     Diabetic: No     Tobacco smoker: No     Systolic Blood Pressure: 134 mmHg     Is BP treated: Yes     HDL Cholesterol: 47 mg/dL     Total Cholesterol: 161 mg/dL  2. Vitamin D deficiency Discussed labs with patient today. Current vitamin D is 96.6, tested on 01/20/2020. High. Will hold for one month, then restart OTC vitamin D 1000-2000 IU daily.   Plan:  []   Continue Vitamin D @50 ,000 IU every week. []   Continue home supplement daily. [x]   Follow-up for routine testing of  Vitamin D at least 2-3 times per year to avoid over-replacement.  3. Visceral obesity Current visceral fat rating: 14. Visceral fat rating should be < 13. Visceral adipose tissue is a hormonally active component of total body fat. This body composition phenotype is associated with medical disorders such as metabolic syndrome, cardiovascular disease and several malignancies including prostate, breast, and colorectal cancers. Starting goal: Lose 7-10% of starting weight.   4. Essential hypertension At goal. Medications: HCTZ 12.5 mg daily.   Plan: Avoid buying foods that are: processed, frozen, or prepackaged to avoid excess salt. We will continue to monitor symptoms as they relate to his weight loss journey.  BP Readings from Last 3 Encounters:  02/10/20 119/76  01/20/20 132/81  12/19/19 108/69   Lab Results  Component Value Date   CREATININE 1.02 01/20/2020   5. At risk for heart disease Seth Morales was given approximately 9 minutes of coronary artery disease prevention counseling today. He is 59 y.o. male and has risk factors for heart disease including obesity and metabolic syndrome. We discussed intensive lifestyle modifications today with an emphasis on specific weight loss instructions and strategies. Repetitive spaced learning was employed today to elicit superior memory formation and behavioral change.  6. Class 1 obesity with serious comorbidity and body mass index (BMI) of 30.0 to 30.9 in adult, unspecified obesity type  Course: Seth Morales is  currently in the action stage of change. As such, his goal is to continue with weight loss efforts.   Nutrition goals: He has agreed to the Category 3 Plan.   Exercise goals: For substantial health benefits, adults should do at least 150 minutes (2 hours and 30 minutes) a week of moderate-intensity, or 75 minutes (1 hour and 15 minutes) a week of vigorous-intensity aerobic physical activity, or an equivalent combination of moderate- and  vigorous-intensity aerobic activity. Aerobic activity should be performed in episodes of at least 10 minutes, and preferably, it should be spread throughout the week.  Behavioral modification strategies: increasing lean protein intake, decreasing simple carbohydrates, increasing vegetables, increasing water intake, dealing with family or coworker sabotage, travel eating strategies and holiday eating strategies .  Seth Morales has agreed to follow-up with our clinic in 4 weeks. He was informed of the importance of frequent follow-up visits to maximize his success with intensive lifestyle modifications for his multiple health conditions.   Objective:   Blood pressure 119/76, pulse 72, temperature 98.1 F (36.7 C), temperature source Oral, height 6\' 5"  (1.956 m), weight 226 lb (102.5 kg), SpO2 96 %. Body mass index is 26.8 kg/m.  General: Cooperative, alert, well developed, in no acute distress. HEENT: Conjunctivae and lids unremarkable. Cardiovascular: Regular rhythm.  Lungs: Normal work of breathing. Neurologic: No focal deficits.   Lab Results  Component Value Date   CREATININE 1.02 01/20/2020   BUN 26 (H) 01/20/2020   NA 143 01/20/2020   K 3.8 01/20/2020   CL 99 01/20/2020   CO2 30 (H) 01/20/2020   Lab Results  Component Value Date   ALT 39 01/20/2020   AST 27 01/20/2020   ALKPHOS 74 01/20/2020   BILITOT 1.0 01/20/2020   Lab Results  Component Value Date   HGBA1C 5.4 05/22/2019   HGBA1C 5.4 11/30/2018   HGBA1C 5.6 07/12/2017   HGBA1C 5.2 11/07/2014   Lab Results  Component Value Date   INSULIN 6.0 01/20/2020   INSULIN 14.6 05/22/2019   Lab Results  Component Value Date   TSH 2.970 05/22/2019   Lab Results  Component Value Date   CHOL 161 01/20/2020   HDL 47 01/20/2020   LDLCALC 89 01/20/2020   LDLDIRECT 114.0 03/14/2019   TRIG 144 01/20/2020   CHOLHDL 3.4 01/20/2020   Lab Results  Component Value Date   WBC 6.4 01/20/2020   HGB 14.8 01/20/2020   HCT 45.5  01/20/2020   MCV 93 01/20/2020   PLT 177 01/20/2020   Attestation Statements:   Reviewed by clinician on day of visit: allergies, medications, problem list, medical history, surgical history, family history, social history, and previous encounter notes.  I, 01/22/2020, CMA, am acting as transcriptionist for Insurance claims handler, DO  I have reviewed the above documentation for accuracy and completeness, and I agree with the above. Helane Rima, DO

## 2020-02-14 ENCOUNTER — Encounter: Payer: Self-pay | Admitting: Family Medicine

## 2020-02-14 DIAGNOSIS — K121 Other forms of stomatitis: Secondary | ICD-10-CM

## 2020-02-14 MED ORDER — MAGIC MOUTHWASH W/LIDOCAINE: 5.0000 mL | Freq: Three times a day (TID) | ORAL | 0 refills | Status: DC | PRN

## 2020-02-14 NOTE — Addendum Note (Signed)
Addended by: Alysia Penna L on: 02/14/2020 04:20 PM   Modules accepted: Orders

## 2020-03-04 ENCOUNTER — Encounter: Payer: Self-pay | Admitting: Family Medicine

## 2020-03-05 ENCOUNTER — Encounter: Payer: Self-pay | Admitting: Family

## 2020-03-05 ENCOUNTER — Ambulatory Visit: Payer: BC Managed Care – PPO | Admitting: Family

## 2020-03-05 ENCOUNTER — Other Ambulatory Visit: Payer: Self-pay

## 2020-03-05 ENCOUNTER — Ambulatory Visit (INDEPENDENT_AMBULATORY_CARE_PROVIDER_SITE_OTHER): Payer: BC Managed Care – PPO

## 2020-03-05 VITALS — BP 144/78 | HR 57 | Temp 97.4°F | Ht 77.0 in | Wt 236.8 lb

## 2020-03-05 DIAGNOSIS — M25511 Pain in right shoulder: Secondary | ICD-10-CM

## 2020-03-05 MED ORDER — PREDNISONE 20 MG PO TABS: 20.0000 mg | ORAL_TABLET | Freq: Every day | ORAL | 0 refills | Status: DC

## 2020-03-05 NOTE — Progress Notes (Signed)
Acute Office Visit  Subjective:    Patient ID: Seth Morales, male    DOB: Oct 02, 1960, 59 y.o.   MRN: [id number]  Chief Complaint  Patient presents with  . Shoulder Pain    Right shoulder pain that radiates to elbow symptoms x 1 month, no improvement.     HPI Patient is in today with c/o right shoulder pain x 82months and worsening. 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 Pain 1-2/10 when not moving and increased to 7-8 when attempting to put his seatbelt on. He has used a Scientist, clinical (histocompatibility and immunogenetics) that has helped. Has a history of renal transplant  Past Medical History:  Diagnosis Date  . Allergy   . Anemia   . Back pain   . Bilateral swelling of feet   . Blood transfusion without reported diagnosis 13 years ago   . Chronic kidney disease   . Dermatitis   . Hypertension   . Joint pain   . Kidney transplanted   . Vitamin D deficiency     Past Surgical History:  Procedure Laterality Date  . COLONOSCOPY  09/06/2012   at [place]  . EYE SURGERY    . INSERTION OF DIALYSIS CATHETER    . KIDNEY TRANSPLANT  2006  . KNEE ARTHROPLASTY Bilateral   . NEPHRECTOMY Bilateral     Family History  Problem Relation Age of Onset  . Arthritis Mother   . Hypertension Mother   . Kidney disease Mother   . Diabetes Mother   . Diabetes Father   . Colon cancer Neg Hx   . Colon polyps Neg Hx   . Esophageal cancer Neg Hx   . Rectal cancer Neg Hx   . Stomach cancer Neg Hx     Social History   Socioeconomic History  . Marital status: Married    Spouse name: Not on file  . Number of children: Not on file  . Years of education: Not on file  . Highest education level: Not on file  Occupational History  . Not on file  Tobacco Use  . Smoking status: Never Smoker  . Smokeless tobacco: Never Used  Vaping Use  . Vaping Use: Never used  Substance and Sexual Activity  . Alcohol use: Yes    Alcohol/week: 2.0 standard drinks    Types: 2  Standard drinks or equivalent per week  . Drug use: No  . Sexual activity: Not on file  Other Topics Concern  . Not on file  Social History Narrative  . Not on file   Social Determinants of Health   Financial Resource Strain: Not on file  Food Insecurity: Not on file  Transportation Needs: Not on file  Physical Activity: Not on file  Stress: Not on file  Social Connections: Not on file  Intimate Partner Violence: Not on file    Outpatient Medications Prior to Visit  Medication Sig Dispense Refill  . allopurinol (ZYLOPRIM) 100 MG tablet Take 50 mg by mouth daily.     . Cholecalciferol (VITAMIN D) 125 MCG (5000 UT) CAPS Take 5,000 Units by mouth daily.     . cycloSPORINE modified (NEORAL) 100 MG capsule Take 100 mg by mouth 2 (two) times daily.    . cycloSPORINE modified (NEORAL) 25 MG capsule     . hydrochlorothiazide (MICROZIDE) 12.5 MG capsule Take 12.5 mg by mouth every other day.    . magnesium oxide-pyridoxine (BEELITH) 362-20 MG TABS Take  1 tablet by mouth daily.    . Multiple Vitamin (MULTI-VITAMIN) tablet Take by mouth.    . mycophenolate (CELLCEPT) 250 MG capsule Take 750 mg by mouth 2 (two) times daily.    . Omega-3 Fatty Acids (FISH OIL PO) Take by mouth.    . predniSONE (DELTASONE) 5 MG tablet Take 1 tablet (5 mg total) by mouth daily with breakfast. 90 tablet 2  . selenium sulfide (SELSUN) 2.5 % shampoo Apply topically as directed.    . Sulfacetamide Sodium-Sulfur 8-4 % SUSP     . magic mouthwash w/lidocaine SOLN Take 5 mLs by mouth 3 (three) times daily as needed for mouth pain. Swish and spit. (Patient not taking: Reported on 03/05/2020) 120 mL 0   No facility-administered medications prior to visit.    No Known Allergies  Review of Systems  Constitutional: Negative.   Respiratory: Negative.   Cardiovascular: Negative.   Endocrine: Negative.   Musculoskeletal: Positive for arthralgias.       Right shoulder pain  Skin: Negative.   Neurological: Negative.    Psychiatric/Behavioral: Negative.   All other systems reviewed and are negative.      Objective:    Physical Exam Vitals and nursing note reviewed.  Constitutional:      Appearance: Normal appearance.  Cardiovascular:     Rate and Rhythm: Normal rate and regular rhythm.     Pulses: Normal pulses.     Heart sounds: Normal heart sounds.  Pulmonary:     Effort: Pulmonary effort is normal.     Breath sounds: Normal breath sounds.  Abdominal:     General: Bowel sounds are normal.     Palpations: Abdomen is soft.  Musculoskeletal:     Cervical back: Normal range of motion and neck supple.     Comments: Tenderness to palpation of the anterior right shoulder. Pain elicited with the empty can test maneuver and with abduction of the shoulder. No obvious swelling or deformity  Skin:    General: Skin is warm and dry.  Neurological:     General: No focal deficit present.     Mental Status: He is alert and oriented to person, place, and time.     Cranial Nerves: No cranial nerve deficit.     Sensory: No sensory deficit.     Motor: No weakness.     Coordination: Coordination normal.     BP (!) 144/78   Pulse (!) 57   Temp (!) 97.4 F (36.3 C) (Temporal)   Ht 6\' 5"  (1.956 m)   Wt 236 lb 12.8 oz (107.4 kg)   SpO2 98%   BMI 28.08 kg/m  Wt Readings from Last 3 Encounters:  03/05/20 236 lb 12.8 oz (107.4 kg)  02/10/20 226 lb (102.5 kg)  01/20/20 222 lb (100.7 kg)    There are no preventive care reminders to display for this patient.  There are no preventive care reminders to display for this patient.   Lab Results  Component Value Date   TSH 2.970 05/22/2019   Lab Results  Component Value Date   WBC 6.4 01/20/2020   HGB 14.8 01/20/2020   HCT 45.5 01/20/2020   MCV 93 01/20/2020   PLT 177 01/20/2020   Lab Results  Component Value Date   NA 143 01/20/2020   K 3.8 01/20/2020   CO2 30 (H) 01/20/2020   GLUCOSE 90 01/20/2020   BUN 26 (H) 01/20/2020   CREATININE 1.02  01/20/2020   BILITOT 1.0 01/20/2020  ALKPHOS 74 01/20/2020   AST 27 01/20/2020   ALT 39 01/20/2020   PROT 6.2 01/20/2020   ALBUMIN 4.0 01/20/2020   CALCIUM 9.2 01/20/2020   GFR 72.79 07/12/2017   Lab Results  Component Value Date   CHOL 161 01/20/2020   Lab Results  Component Value Date   HDL 47 01/20/2020   Lab Results  Component Value Date   LDLCALC 89 01/20/2020   Lab Results  Component Value Date   TRIG 144 01/20/2020   Lab Results  Component Value Date   CHOLHDL 3.4 01/20/2020   Lab Results  Component Value Date   HGBA1C 5.4 05/22/2019       Assessment & Plan:   Problem List Items Addressed This Visit   None   Visit Diagnoses    Acute pain of right shoulder    -  Primary   Relevant Orders   DG Shoulder Right       Meds ordered this encounter  Medications  . predniSONE (DELTASONE) 20 MG tablet    Sig: Take 1 tablet (20 mg total) by mouth daily with breakfast. 60 mg po qam x 3 days 40mg  po qam x 3 days 20mg  po qam x 3 days    Dispense:  18 tablet    Refill:  0   Call the office if symptoms worsen or persist. Consider ortho referral if necessary.  , FNP

## 2020-03-05 NOTE — Patient Instructions (Signed)
Shoulder Exercises Ask your health care provider which exercises are safe for you. Do exercises exactly as told by your health care provider and adjust them as directed. It is normal to feel mild stretching, pulling, tightness, or discomfort as you do these exercises. Stop right away if you feel sudden pain or your pain gets worse. Do not begin these exercises until told by your health care provider. Stretching exercises External rotation and abduction This exercise is sometimes called corner stretch. This exercise rotates your arm outward (external rotation) and moves your arm out from your body (abduction). 1. Stand in a doorway with one of your feet slightly in front of the other. This is called a staggered stance. If you cannot reach your forearms to the door frame, stand facing a corner of a room. 2. Choose one of the following positions as told by your health care provider: ? Place your hands and forearms on the door frame above your head. ? Place your hands and forearms on the door frame at the height of your head. ? Place your hands on the door frame at the height of your elbows. 3. Slowly move your weight onto your front foot until you feel a stretch across your chest and in the front of your shoulders. Keep your head and chest upright and keep your abdominal muscles tight. 4. Hold for __________ seconds. 5. To release the stretch, shift your weight to your back foot. Repeat __________ times. Complete this exercise __________ times a day. Extension, standing 1. Stand and hold a broomstick, a cane, or a similar object behind your back. ? Your hands should be a little wider than shoulder width apart. ? Your palms should face away from your back. 2. Keeping your elbows straight and your shoulder muscles relaxed, move the stick away from your body until you feel a stretch in your shoulders (extension). ? Avoid shrugging your shoulders while you move the stick. Keep your shoulder blades tucked  down toward the middle of your back. 3. Hold for __________ seconds. 4. Slowly return to the starting position. Repeat __________ times. Complete this exercise __________ times a day. Range-of-motion exercises Pendulum  1. Stand near a wall or a surface that you can hold onto for balance. 2. Bend at the waist and let your left / right arm hang straight down. Use your other arm to support you. Keep your back straight and do not lock your knees. 3. Relax your left / right arm and shoulder muscles, and move your hips and your trunk so your left / right arm swings freely. Your arm should swing because of the motion of your body, not because you are using your arm or shoulder muscles. 4. Keep moving your hips and trunk so your arm swings in the following directions, as told by your health care provider: ? Side to side. ? Forward and backward. ? In clockwise and counterclockwise circles. 5. Continue each motion for __________ seconds, or for as long as told by your health care provider. 6. Slowly return to the starting position. Repeat __________ times. Complete this exercise __________ times a day. Shoulder flexion, standing  1. Stand and hold a broomstick, a cane, or a similar object. Place your hands a little more than shoulder width apart on the object. Your left / right hand should be palm up, and your other hand should be palm down. 2. Keep your elbow straight and your shoulder muscles relaxed. Push the stick up with your healthy arm to   raise your left / right arm in front of your body, and then over your head until you feel a stretch in your shoulder (flexion). ? Avoid shrugging your shoulder while you raise your arm. Keep your shoulder blade tucked down toward the middle of your back. 3. Hold for __________ seconds. 4. Slowly return to the starting position. Repeat __________ times. Complete this exercise __________ times a day. Shoulder abduction, standing 1. Stand and hold a broomstick,  a cane, or a similar object. Place your hands a little more than shoulder width apart on the object. Your left / right hand should be palm up, and your other hand should be palm down. 2. Keep your elbow straight and your shoulder muscles relaxed. Push the object across your body toward your left / right side. Raise your left / right arm to the side of your body (abduction) until you feel a stretch in your shoulder. ? Do not raise your arm above shoulder height unless your health care provider tells you to do that. ? If directed, raise your arm over your head. ? Avoid shrugging your shoulder while you raise your arm. Keep your shoulder blade tucked down toward the middle of your back. 3. Hold for __________ seconds. 4. Slowly return to the starting position. Repeat __________ times. Complete this exercise __________ times a day. Internal rotation  1. Place your left / right hand behind your back, palm up. 2. Use your other hand to dangle an exercise band, a towel, or a similar object over your shoulder. Grasp the band with your left / right hand so you are holding on to both ends. 3. Gently pull up on the band until you feel a stretch in the front of your left / right shoulder. The movement of your arm toward the center of your body is called internal rotation. ? Avoid shrugging your shoulder while you raise your arm. Keep your shoulder blade tucked down toward the middle of your back. 4. Hold for __________ seconds. 5. Release the stretch by letting go of the band and lowering your hands. Repeat __________ times. Complete this exercise __________ times a day. Strengthening exercises External rotation  1. Sit in a stable chair without armrests. 2. Secure an exercise band to a stable object at elbow height on your left / right side. 3. Place a soft object, such as a folded towel or a small pillow, between your left / right upper arm and your body to move your elbow about 4 inches (10 cm) away  from your side. 4. Hold the end of the exercise band so it is tight and there is no slack. 5. Keeping your elbow pressed against the soft object, slowly move your forearm out, away from your abdomen (external rotation). Keep your body steady so only your forearm moves. 6. Hold for __________ seconds. 7. Slowly return to the starting position. Repeat __________ times. Complete this exercise __________ times a day. Shoulder abduction  1. Sit in a stable chair without armrests, or stand up. 2. Hold a __________ weight in your left / right hand, or hold an exercise band with both hands. 3. Start with your arms straight down and your left / right palm facing in, toward your body. 4. Slowly lift your left / right hand out to your side (abduction). Do not lift your hand above shoulder height unless your health care provider tells you that this is safe. ? Keep your arms straight. ? Avoid shrugging your shoulder while you   do this movement. Keep your shoulder blade tucked down toward the middle of your back. 5. Hold for __________ seconds. 6. Slowly lower your arm, and return to the starting position. Repeat __________ times. Complete this exercise __________ times a day. Shoulder extension 1. Sit in a stable chair without armrests, or stand up. 2. Secure an exercise band to a stable object in front of you so it is at shoulder height. 3. Hold one end of the exercise band in each hand. Your palms should face each other. 4. Straighten your elbows and lift your hands up to shoulder height. 5. Step back, away from the secured end of the exercise band, until the band is tight and there is no slack. 6. Squeeze your shoulder blades together as you pull your hands down to the sides of your thighs (extension). Stop when your hands are straight down by your sides. Do not let your hands go behind your body. 7. Hold for __________ seconds. 8. Slowly return to the starting position. Repeat __________ times.  Complete this exercise __________ times a day. Shoulder row 1. Sit in a stable chair without armrests, or stand up. 2. Secure an exercise band to a stable object in front of you so it is at waist height. 3. Hold one end of the exercise band in each hand. Position your palms so that your thumbs are facing the ceiling (neutral position). 4. Bend each of your elbows to a 90-degree angle (right angle) and keep your upper arms at your sides. 5. Step back until the band is tight and there is no slack. 6. Slowly pull your elbows back behind you. 7. Hold for __________ seconds. 8. Slowly return to the starting position. Repeat __________ times. Complete this exercise __________ times a day. Shoulder press-ups  1. Sit in a stable chair that has armrests. Sit upright, with your feet flat on the floor. 2. Put your hands on the armrests so your elbows are bent and your fingers are pointing forward. Your hands should be about even with the sides of your body. 3. Push down on the armrests and use your arms to lift yourself off the chair. Straighten your elbows and lift yourself up as much as you comfortably can. ? Move your shoulder blades down, and avoid letting your shoulders move up toward your ears. ? Keep your feet on the ground. As you get stronger, your feet should support less of your body weight as you lift yourself up. 4. Hold for __________ seconds. 5. Slowly lower yourself back into the chair. Repeat __________ times. Complete this exercise __________ times a day. Wall push-ups  1. Stand so you are facing a stable wall. Your feet should be about one arm-length away from the wall. 2. Lean forward and place your palms on the wall at shoulder height. 3. Keep your feet flat on the floor as you bend your elbows and lean forward toward the wall. 4. Hold for __________ seconds. 5. Straighten your elbows to push yourself back to the starting position. Repeat __________ times. Complete this exercise  __________ times a day. This information is not intended to replace advice given to you by your health care provider. Make sure you discuss any questions you have with your health care provider. Document Revised: 06/15/2018 Document Reviewed: 03/23/2018 [employer] Patient Education  2020 [employer]  

## 2020-03-10 ENCOUNTER — Other Ambulatory Visit: Payer: Self-pay

## 2020-03-10 ENCOUNTER — Ambulatory Visit (INDEPENDENT_AMBULATORY_CARE_PROVIDER_SITE_OTHER): Payer: BC Managed Care – PPO | Admitting: Family Medicine

## 2020-03-10 ENCOUNTER — Encounter (INDEPENDENT_AMBULATORY_CARE_PROVIDER_SITE_OTHER): Payer: Self-pay | Admitting: Family Medicine

## 2020-03-10 VITALS — BP 147/88 | HR 68 | Temp 97.8°F | Ht 77.0 in | Wt 228.0 lb

## 2020-03-10 DIAGNOSIS — I10 Essential (primary) hypertension: Secondary | ICD-10-CM

## 2020-03-10 DIAGNOSIS — Z6830 Body mass index (BMI) 30.0-30.9, adult: Secondary | ICD-10-CM | POA: Diagnosis not present

## 2020-03-10 DIAGNOSIS — E669 Obesity, unspecified: Secondary | ICD-10-CM | POA: Diagnosis not present

## 2020-03-10 DIAGNOSIS — Q613 Polycystic kidney, unspecified: Secondary | ICD-10-CM

## 2020-03-10 DIAGNOSIS — Z9189 Other specified personal risk factors, not elsewhere classified: Secondary | ICD-10-CM | POA: Diagnosis not present

## 2020-03-12 NOTE — Progress Notes (Signed)
Chief Complaint:   OBESITY Seth Morales is here to discuss his progress with his obesity treatment plan along with follow-up of his obesity related diagnoses.   Today's visit was #: 13 Starting weight: 285 lbs Starting date: 05/22/2019 Today's weight: 228 lbs Today's date: 03/10/2020 Total lbs lost to date: 57 lbs Body mass index is 27.04 kg/m.  Total weight loss percentage to date: -20.00%  Interim History: [name] says he has been overindulging more often lately.  He has had right shoulder inflammation and is taking a prednisone taper.  He says his hunger has increased.  Blood pressure is elevated today. Nutrition Plan: the Category 3 Plan for 40% today..  Hunger is poorly controlled. Cravings are poorly controlled.  Activity: Walking a few miles 3 times per week.  Assessment/Plan:   1. Essential hypertension Elevated today, likely due to steroids.  Medications: HCTZ 12.5 mg daily.   Plan:  Will monitor.  Avoid buying foods that are: processed, frozen, or prepackaged to avoid excess salt. We will continue to monitor symptoms as they relate to his weight loss journey.  BP Readings from Last 3 Encounters:  03/10/20 (!) 147/88  03/05/20 (!) 144/78  02/10/20 119/76   Lab Results  Component Value Date   CREATININE 1.02 01/20/2020   2. Polycystic kidney disease He is taking cyclosporine and Cellcept.  He is at a higher risk of COVID complications.  He has been vaccinated and boosted with a positive COVID spike protein on 11/13/2019.   3. At increased risk of exposure to COVID-19 virus The patient is at higher risk of COVID-19 infection due to traveling for work.  We discussed antigen tests and monoclonal antibody options.  4. Class 1 obesity with serious comorbidity and body mass index (BMI) of 30.0 to 30.9 in adult, unspecified obesity type  Course: [name] is currently in the action stage of change. As such, his goal is to continue with weight loss efforts.   Nutrition  goals: He has agreed to the Category 3 Plan.   Exercise goals: For substantial health benefits, adults should do at least 150 minutes (2 hours and 30 minutes) a week of moderate-intensity, or 75 minutes (1 hour and 15 minutes) a week of vigorous-intensity aerobic physical activity, or an equivalent combination of moderate- and vigorous-intensity aerobic activity. Aerobic activity should be performed in episodes of at least 10 minutes, and preferably, it should be spread throughout the week.  Behavioral modification strategies: increasing lean protein intake, decreasing simple carbohydrates, increasing vegetables and increasing water intake.  [name] has agreed to follow-up with our clinic in 4 weeks. He was informed of the importance of frequent follow-up visits to maximize his success with intensive lifestyle modifications for his multiple health conditions.   Objective:   Blood pressure (!) 147/88, pulse 68, temperature 97.8 F (36.6 C), temperature source Oral, height 6\' 5"  (1.956 m), weight 228 lb (103.4 kg), SpO2 96 %. Body mass index is 27.04 kg/m.  General: Cooperative, alert, well developed, in no acute distress. HEENT: Conjunctivae and lids unremarkable. Cardiovascular: Regular rhythm.  Lungs: Normal work of breathing. Neurologic: No focal deficits.   Lab Results  Component Value Date   CREATININE 1.02 01/20/2020   BUN 26 (H) 01/20/2020   NA 143 01/20/2020   K 3.8 01/20/2020   CL 99 01/20/2020   CO2 30 (H) 01/20/2020   Lab Results  Component Value Date   ALT 39 01/20/2020   AST 27 01/20/2020   ALKPHOS 74 01/20/2020  BILITOT 1.0 01/20/2020   Lab Results  Component Value Date   HGBA1C 5.4 05/22/2019   HGBA1C 5.4 11/30/2018   HGBA1C 5.6 07/12/2017   HGBA1C 5.2 11/07/2014   Lab Results  Component Value Date   INSULIN 6.0 01/20/2020   INSULIN 14.6 05/22/2019   Lab Results  Component Value Date   TSH 2.970 05/22/2019   Lab Results  Component Value Date    CHOL 161 01/20/2020   HDL 47 01/20/2020   LDLCALC 89 01/20/2020   LDLDIRECT 114.0 03/14/2019   TRIG 144 01/20/2020   CHOLHDL 3.4 01/20/2020   Lab Results  Component Value Date   WBC 6.4 01/20/2020   HGB 14.8 01/20/2020   HCT 45.5 01/20/2020   MCV 93 01/20/2020   PLT 177 01/20/2020   Attestation Statements:   Reviewed by clinician on day of visit: allergies, medications, problem list, medical history, surgical history, family history, social history, and previous encounter notes.  I, Insurance claims handler, CMA, am acting as transcriptionist for Helane Rima, DO  I have reviewed the above documentation for accuracy and completeness, and I agree with the above. Helane Rima, DO

## 2020-03-29 ENCOUNTER — Encounter: Payer: Self-pay | Admitting: Family Medicine

## 2020-03-30 ENCOUNTER — Ambulatory Visit: Payer: BC Managed Care – PPO | Admitting: Nurse Practitioner

## 2020-03-30 ENCOUNTER — Encounter: Payer: Self-pay | Admitting: Family Medicine

## 2020-03-30 ENCOUNTER — Ambulatory Visit: Payer: BC Managed Care – PPO | Admitting: Family Medicine

## 2020-03-30 ENCOUNTER — Ambulatory Visit: Payer: Self-pay

## 2020-03-30 ENCOUNTER — Other Ambulatory Visit: Payer: Self-pay

## 2020-03-30 ENCOUNTER — Encounter: Payer: Self-pay | Admitting: Nurse Practitioner

## 2020-03-30 VITALS — BP 150/90 | HR 69 | Ht 76.0 in | Wt 244.0 lb

## 2020-03-30 VITALS — BP 140/76 | HR 68 | Temp 98.3°F | Ht 76.0 in | Wt 243.8 lb

## 2020-03-30 DIAGNOSIS — S76302A Unspecified injury of muscle, fascia and tendon of the posterior muscle group at thigh level, left thigh, initial encounter: Secondary | ICD-10-CM

## 2020-03-30 DIAGNOSIS — S76302S Unspecified injury of muscle, fascia and tendon of the posterior muscle group at thigh level, left thigh, sequela: Secondary | ICD-10-CM

## 2020-03-30 DIAGNOSIS — W19XXXA Unspecified fall, initial encounter: Secondary | ICD-10-CM

## 2020-03-30 DIAGNOSIS — S76312A Strain of muscle, fascia and tendon of the posterior muscle group at thigh level, left thigh, initial encounter: Secondary | ICD-10-CM | POA: Diagnosis not present

## 2020-03-30 NOTE — Patient Instructions (Signed)
You have an appt with Dr. Katrinka Blazing today at 1:30pm [facility] -1st floor [address] [place] [phone]

## 2020-03-30 NOTE — Assessment & Plan Note (Signed)
Patient has large tear.  Patient has no pain no at the knee specifically or at the ischial insertion.  Seems to be mid substance.  Patient had 1 of these on the contralateral side multiple years ago.  Patient will do compression, topical Arnica, heel lift given in the shoe to try to take some pressure of the hamstring and will do short strides.  Follow-up again in 2 weeks to further evaluate.  Handicap sticker given today for the next 3 months.  Depending on how patient responds we will discuss physical therapy or starting home exercises.  Patient declined x-rays today

## 2020-03-30 NOTE — Progress Notes (Signed)
[name] [address] [place] [address] Phone: [phone] Subjective:   I Seth Morales am serving as a Neurosurgeon for Dr. Antoine Primas.  This visit occurred during the SARS-CoV-2 public health emergency.  Safety protocols were in place, including screening questions prior to the visit, additional usage of staff PPE, and extensive cleaning of exam room while observing appropriate contact time as indicated for disinfecting solutions.   I'm seeing this patient by the request  of:  [name], DO  CC: Left hamstring injury  [id number]  Seth Morales is a 60 y.o. male coming in with complaint of left hamstring injury after falling on the ice. [place] [facility] referral. Patient seen in 2018 for right hamstring strain. Patient states the hamstring is painful and tight. States the pain keeps him up at night. Pain radiates distally. Has bruising into the calf muscle. Believes he over worked the muscle walking yesterday.   Onset- Wednesday Location- hamstring left Character- tight, achy Aggravating factors- walking, standing Reliving factors-  Therapies tried- naproxen, ice Severity-6-7/ 10 at its worse      Past Medical History:  Diagnosis Date  . Allergy   . Anemia   . Back pain   . Bilateral swelling of feet   . Blood transfusion without reported diagnosis 13 years ago   . Chronic kidney disease   . Dermatitis   . Hypertension   . Joint pain   . Kidney transplanted   . Vitamin D deficiency    Past Surgical History:  Procedure Laterality Date  . COLONOSCOPY  09/06/2012   at [place]  . EYE SURGERY    . INSERTION OF DIALYSIS CATHETER    . KIDNEY TRANSPLANT  2006  . KNEE ARTHROPLASTY Bilateral   . NEPHRECTOMY Bilateral    Social History   Socioeconomic History  . Marital status: Married    Spouse name: Not on file  . Number of children: Not on file  . Years of education: Not on file  . Highest education level:  Not on file  Occupational History  . Not on file  Tobacco Use  . Smoking status: Never Smoker  . Smokeless tobacco: Never Used  Vaping Use  . Vaping Use: Never used  Substance and Sexual Activity  . Alcohol use: Yes    Alcohol/week: 2.0 standard drinks    Types: 2 Standard drinks or equivalent per week  . Drug use: No  . Sexual activity: Not on file  Other Topics Concern  . Not on file  Social History Narrative  . Not on file   Social Determinants of Health   Financial Resource Strain: Not on file  Food Insecurity: Not on file  Transportation Needs: Not on file  Physical Activity: Not on file  Stress: Not on file  Social Connections: Not on file   No Known Allergies Family History  Problem Relation Age of Onset  . Arthritis Mother   . Hypertension Mother   . Kidney disease Mother   . Diabetes Mother   . Diabetes Father   . Colon cancer Neg Hx   . Colon polyps Neg Hx   . Esophageal cancer Neg Hx   . Rectal cancer Neg Hx   . Stomach cancer Neg Hx     Current Outpatient Medications (Endocrine & Metabolic):  .  predniSONE (DELTASONE) 5 MG tablet, Take 1 tablet (5 mg total) by mouth daily with breakfast.  Current Outpatient Medications (Cardiovascular):  .  hydrochlorothiazide (MICROZIDE) 12.5 MG capsule, Take 12.5 mg by mouth every other day.   Current Outpatient Medications (Analgesics):  .  allopurinol (ZYLOPRIM) 100 MG tablet, Take 50 mg by mouth daily.    Current Outpatient Medications (Other):  Marland Kitchen  Cholecalciferol (VITAMIN D) 125 MCG (5000 UT) CAPS, Take 5,000 Units by mouth daily.  .  cycloSPORINE modified (NEORAL) 100 MG capsule, Take 100 mg by mouth 2 (two) times daily. .  cycloSPORINE modified (NEORAL) 25 MG capsule,  .  magnesium oxide-pyridoxine (BEELITH) 362-20 MG TABS, Take 1 tablet by mouth daily. .  Multiple Vitamin (MULTI-VITAMIN) tablet, Take by mouth. .  mycophenolate (CELLCEPT) 250 MG capsule, Take 750 mg by mouth 2 (two) times daily. .   Omega-3 Fatty Acids (FISH OIL PO), Take by mouth. .  selenium sulfide (SELSUN) 2.5 % shampoo, Apply topically as directed. .  Sulfacetamide Sodium-Sulfur 8-4 % SUSP,    Reviewed prior external information including notes and imaging from  primary care provider As well as notes that were available from care everywhere and other healthcare systems.  Past medical history, social, surgical and family history all reviewed in electronic medical record.  No pertanent information unless stated regarding to the chief complaint.   Review of Systems:  No headache, visual changes, nausea, vomiting, diarrhea, constipation, dizziness, abdominal pain, skin rash, fevers, chills, night sweats, weight loss, swollen lymph nodes, body aches, joint swelling, chest pain, shortness of breath, mood changes. POSITIVE muscle aches  Objective  Blood pressure (!) 150/90, pulse 69, height 6\' 4"  (1.93 m), weight 244 lb (110.7 kg), SpO2 100 %.   General: No apparent distress alert and oriented x3 mood and affect normal, dressed appropriately.  HEENT: Pupils equal, extraocular movements intact  Respiratory: Patient's speak in full sentences and does not appear short of breath  Cardiovascular: No lower extremity edema, non tender, no erythema  Gait mild antalgic gait Patient's left hamstring on inspection does have significant swelling and bruising noted.  Patient does have bruising about midway substance of the hamstring all the way past the knee.  Patient does have a trace soft tissue swelling of the lower extremity as well.  No pain in the calf itself though.  Pain in the mid substance of the hamstring.  Worsening pain with extension that is full at the knee but patient is able to do it.  Patient also does have good flexion of the knee but does have pain and goes into a minor spasm.  No pain over the ischial area noted.  No true defect noted.  Limited musculoskeletal ultrasound was performed and interpreted by  Limited ultrasound of patient's hamstring midsubstance tear noted with significant surrounding soft tissue edema.  This measures approximately 12 cm in length.  Seems to be superficial.  Difficult to assess for sure but seems to be more of the biceps femoris does not extend to the tendon.. Impression: Hamstring tear mid substance    Impression and Recommendations:     The above documentation has been reviewed and is accurate and complete Judi Saa, DO

## 2020-03-30 NOTE — Patient Instructions (Signed)
Good to see you Arnica lotion  Handicap for 3 months Thigh compression sleeve whenever you are awake Heel lift  Voltaren gel for pain Take short strides with walking See me again in 2 weeks

## 2020-03-30 NOTE — Progress Notes (Signed)
Subjective:  Patient ID: Seth Morales, male    DOB: 07/21/1960  Age: 60 y.o. MRN: [id number]  CC: Acute Visit (Pt c/o left leg injury when falling on ice Wednesday of last week. Pt states he had pain and discomfort before but since the fall there was a pop and it has been worse. )  Fall The accident occurred 5 to 7 days ago. The fall occurred while walking. He fell from an unknown height. Impact surface: ice. The point of impact was the left hip. The pain is present in the left upper leg, left knee and left lower leg. The symptoms are aggravated by ambulation, use of injured limb and extension. Pertinent negatives include no loss of consciousness, numbness or tingling. He has tried ice, NSAID and rest for the symptoms. The treatment provided no relief.  limited oral NSAID use due to kidney disease and transplant.  Reviewed past Medical, Social and Family history today.  Outpatient Medications Prior to Visit  Medication Sig Dispense Refill  . allopurinol (ZYLOPRIM) 100 MG tablet Take 50 mg by mouth daily.     . Cholecalciferol (VITAMIN D) 125 MCG (5000 UT) CAPS Take 5,000 Units by mouth daily.     . cycloSPORINE modified (NEORAL) 100 MG capsule Take 100 mg by mouth 2 (two) times daily.    . cycloSPORINE modified (NEORAL) 25 MG capsule     . hydrochlorothiazide (MICROZIDE) 12.5 MG capsule Take 12.5 mg by mouth every other day.    . magnesium oxide-pyridoxine (BEELITH) 362-20 MG TABS Take 1 tablet by mouth daily.    . Multiple Vitamin (MULTI-VITAMIN) tablet Take by mouth.    . mycophenolate (CELLCEPT) 250 MG capsule Take 750 mg by mouth 2 (two) times daily.    . Omega-3 Fatty Acids (FISH OIL PO) Take by mouth.    . predniSONE (DELTASONE) 5 MG tablet Take 1 tablet (5 mg total) by mouth daily with breakfast. 90 tablet 2  . selenium sulfide (SELSUN) 2.5 % shampoo Apply topically as directed.    . Sulfacetamide Sodium-Sulfur 8-4 % SUSP     . predniSONE (DELTASONE) 20 MG tablet Take 1 tablet  (20 mg total) by mouth daily with breakfast. 60 mg po qam x 3 days 40mg  po qam x 3 days 20mg  po qam x 3 days (Patient not taking: Reported on 03/30/2020) 18 tablet 0   No facility-administered medications prior to visit.   ROS See HPI  Objective:  BP 140/76 (BP Location: Left Arm, Patient Position: Sitting, Cuff Size: Large)   Pulse 68   Temp 98.3 F (36.8 C) (Temporal)   Ht 6\' 4"  (1.93 m)   Wt 243 lb 12.8 oz (110.6 kg)   SpO2 98%   BMI 29.68 kg/m   Physical Exam Vitals reviewed.  Pulmonary:     Effort: Pulmonary effort is normal.  Musculoskeletal:        General: Swelling, tenderness and signs of injury present.     Left hip: No tenderness or bony tenderness. Normal range of motion. Normal strength.     Right upper leg: Normal.     Left upper leg: Swelling and tenderness present. No lacerations or bony tenderness.     Right knee: Normal.     Left knee: Normal.       Legs:     Comments: Distal hamstring pain with knee extension.  Skin:    Findings: Bruising present.  Neurological:     Mental Status: He is alert and oriented to  person, place, and time.    Assessment & Plan:  This visit occurred during the SARS-CoV-2 public health emergency.  Safety protocols were in place, including screening questions prior to the visit, additional usage of staff PPE, and extensive cleaning of exam room while observing appropriate contact time as indicated for disinfecting solutions.   Seth Morales was seen today for acute visit.  Diagnoses and all orders for this visit:  Fall, initial encounter -     Ambulatory referral to Sports Medicine  Hamstring injury, left, initial encounter -     Ambulatory referral to Sports Medicine    Problem List Items Addressed This Visit   None   Visit Diagnoses    Fall, initial encounter    -  Primary   Relevant Orders   Ambulatory referral to Sports Medicine   Hamstring injury, left, initial encounter       Relevant Orders   Ambulatory referral  to Sports Medicine      Follow-up: No follow-ups on file.  Alysia Penna, NP

## 2020-04-01 ENCOUNTER — Other Ambulatory Visit: Payer: Self-pay

## 2020-04-01 ENCOUNTER — Encounter: Payer: Self-pay | Admitting: Family Medicine

## 2020-04-01 ENCOUNTER — Ambulatory Visit (HOSPITAL_COMMUNITY)
Admission: RE | Admit: 2020-04-01 | Discharge: 2020-04-01 | Disposition: A | Discharge status: Home / Self Care | Payer: BC Managed Care – PPO | Source: Ambulatory Visit | Attending: Cardiovascular Disease | Admitting: Cardiovascular Disease

## 2020-04-01 DIAGNOSIS — M79605 Pain in left leg: Secondary | ICD-10-CM

## 2020-04-08 ENCOUNTER — Ambulatory Visit (INDEPENDENT_AMBULATORY_CARE_PROVIDER_SITE_OTHER): Payer: BC Managed Care – PPO | Admitting: Family Medicine

## 2020-04-13 NOTE — Progress Notes (Unsigned)
[name] [address] [place] [address] Phone: [phone] Subjective:   I Seth Morales am serving as a Neurosurgeon for Dr. Antoine Primas.  This visit occurred during the SARS-CoV-2 public health emergency.  Safety protocols were in place, including screening questions prior to the visit, additional usage of staff PPE, and extensive cleaning of exam room while observing appropriate contact time as indicated for disinfecting solutions.   I'm seeing this patient by the request  of:  [name], DO  CC: Hamstring injury follow-up  [id number]   03/30/2020 Patient has large tear.  Patient has no pain no at the knee specifically or at the ischial insertion.  Seems to be mid substance.  Patient had 1 of these on the contralateral side multiple years ago.  Patient will do compression, topical Arnica, heel lift given in the shoe to try to take some pressure of the hamstring and will do short strides.  Follow-up again in 2 weeks to further evaluate.  Handicap sticker given today for the next 3 months.  Depending on how patient responds we will discuss physical therapy or starting home exercises.  Patient declined x-rays today  Update 2/72/2022 Seth Morales is a 60 y.o. male coming in with complaint of left hamstring pain. Patient states that last Wednesday it started feeling better. Getting better but still having some issues. Swelling has gone down. States he is having the worse time sleeping. Hasn't slept for about 2 weeks and that it is hard getting comfortable. Took some antiinflammatories that didn't help. Started using CBD medications that helped him sleep a little. Medial thigh pain (adductors) that feels like a cramp that won't go away. Having lower back pain on the right side that he believes is compensation.       Past Medical History:  Diagnosis Date  . Allergy   . Anemia   . Back pain   . Bilateral swelling of feet   . Blood  transfusion without reported diagnosis 13 years ago   . Chronic kidney disease   . Dermatitis   . Hypertension   . Joint pain   . Kidney transplanted   . Vitamin D deficiency    Past Surgical History:  Procedure Laterality Date  . COLONOSCOPY  09/06/2012   at [place]  . EYE SURGERY    . INSERTION OF DIALYSIS CATHETER    . KIDNEY TRANSPLANT  2006  . KNEE ARTHROPLASTY Bilateral   . NEPHRECTOMY Bilateral    Social History   Socioeconomic History  . Marital status: Married    Spouse name: Not on file  . Number of children: Not on file  . Years of education: Not on file  . Highest education level: Not on file  Occupational History  . Not on file  Tobacco Use  . Smoking status: Never Smoker  . Smokeless tobacco: Never Used  Vaping Use  . Vaping Use: Never used  Substance and Sexual Activity  . Alcohol use: Yes    Alcohol/week: 2.0 standard drinks    Types: 2 Standard drinks or equivalent per week  . Drug use: No  . Sexual activity: Not on file  Other Topics Concern  . Not on file  Social History Narrative  . Not on file   Social Determinants of Health   Financial Resource Strain: Not on file  Food Insecurity: Not on file  Transportation Needs: Not on file  Physical Activity: Not on file  Stress: Not  on file  Social Connections: Not on file   No Known Allergies Family History  Problem Relation Age of Onset  . Arthritis Mother   . Hypertension Mother   . Kidney disease Mother   . Diabetes Mother   . Diabetes Father   . Colon cancer Neg Hx   . Colon polyps Neg Hx   . Esophageal cancer Neg Hx   . Rectal cancer Neg Hx   . Stomach cancer Neg Hx     Current Outpatient Medications (Endocrine & Metabolic):  .  predniSONE (DELTASONE) 20 MG tablet, Take 1 tablet (20 mg total) by mouth daily with breakfast. .  predniSONE (DELTASONE) 5 MG tablet, Take 1 tablet (5 mg total) by mouth daily with breakfast.  Current Outpatient Medications (Cardiovascular):  .   hydrochlorothiazide (MICROZIDE) 12.5 MG capsule, Take 12.5 mg by mouth every other day.   Current Outpatient Medications (Analgesics):  .  allopurinol (ZYLOPRIM) 100 MG tablet, Take 50 mg by mouth daily.    Current Outpatient Medications (Other):  Marland Kitchen  Cholecalciferol (VITAMIN D) 125 MCG (5000 UT) CAPS, Take 5,000 Units by mouth daily.  .  cyclobenzaprine (FLEXERIL) 5 MG tablet, Take 1 tablet (5 mg total) by mouth at bedtime. .  cycloSPORINE modified (NEORAL) 100 MG capsule, Take 100 mg by mouth 2 (two) times daily. .  cycloSPORINE modified (NEORAL) 25 MG capsule,  .  magnesium oxide-pyridoxine (BEELITH) 362-20 MG TABS, Take 1 tablet by mouth daily. .  Multiple Vitamin (MULTI-VITAMIN) tablet, Take by mouth. .  mycophenolate (CELLCEPT) 250 MG capsule, Take 750 mg by mouth 2 (two) times daily. .  Omega-3 Fatty Acids (FISH OIL PO), Take by mouth. .  selenium sulfide (SELSUN) 2.5 % shampoo, Apply topically as directed. .  Sulfacetamide Sodium-Sulfur 8-4 % SUSP,    Reviewed prior external information including notes and imaging from  primary care provider As well as notes that were available from care everywhere and other healthcare systems.  Past medical history, social, surgical and family history all reviewed in electronic medical record.  No pertanent information unless stated regarding to the chief complaint.   Review of Systems:  No headache, visual changes, nausea, vomiting, diarrhea, constipation, dizziness, abdominal pain, skin rash, fevers, chills, night sweats, weight loss, swollen lymph nodes, body aches, joint swelling, chest pain, shortness of breath, mood changes. POSITIVE muscle aches, joint swelling  Objective  Blood pressure 130/80, pulse 88, height 6\' 4"  (1.93 m), weight 238 lb (108 kg), SpO2 100 %.   General: No apparent distress alert and oriented x3 mood and affect normal, dressed appropriately.  HEENT: Pupils equal, extraocular movements intact  Respiratory:  Patient's speak in full sentences and does not appear short of breath  Cardiovascular: No lower extremity edema, non tender, no erythema  Gait mildly antalgic but minorly better than previous exam. MSK: Left hamstring still has some mild discoloration noted.  The swelling has moved more into the calf.  Patient is minorly tender more in the calf than in the hamstring.  Patient does have 3+ out of 5 strength of the hamstring.  Defect is noted in the mid substance of the hamstring.  No pain no at the insertion of the ischium.  Limited musculoskeletal ultrasound was performed and interpreted by  Limited ultrasound of patient's hamstring now shows that there is a slight very large tear noted of the hamstring.  This is quite large and nearly 16 cm in length.  Significant hypoechoic changes and still noted  in this area.  Patient though does have fibers of both the proximal and distal tendons intact. Impression: Very large midsubstance tear of the hamstring    Impression and Recommendations:     The above documentation has been reviewed and is accurate and complete Judi Saa, DO

## 2020-04-14 ENCOUNTER — Ambulatory Visit: Payer: Self-pay

## 2020-04-14 ENCOUNTER — Other Ambulatory Visit: Payer: Self-pay

## 2020-04-14 ENCOUNTER — Encounter: Payer: Self-pay | Admitting: Family Medicine

## 2020-04-14 ENCOUNTER — Ambulatory Visit (INDEPENDENT_AMBULATORY_CARE_PROVIDER_SITE_OTHER): Payer: BC Managed Care – PPO | Admitting: Family Medicine

## 2020-04-14 VITALS — BP 130/80 | HR 88 | Ht 76.0 in | Wt 238.0 lb

## 2020-04-14 DIAGNOSIS — S76302S Unspecified injury of muscle, fascia and tendon of the posterior muscle group at thigh level, left thigh, sequela: Secondary | ICD-10-CM | POA: Diagnosis not present

## 2020-04-14 DIAGNOSIS — S76312A Strain of muscle, fascia and tendon of the posterior muscle group at thigh level, left thigh, initial encounter: Secondary | ICD-10-CM

## 2020-04-14 MED ORDER — CYCLOBENZAPRINE HCL 5 MG PO TABS: 5.0000 mg | ORAL_TABLET | Freq: Every day | ORAL | 0 refills | Status: DC

## 2020-04-14 MED ORDER — PREDNISONE 20 MG PO TABS: 20.0000 mg | ORAL_TABLET | Freq: Every day | ORAL | 0 refills | Status: DC

## 2020-04-14 MED ORDER — TIZANIDINE HCL 4 MG PO TABS: 4.0000 mg | ORAL_TABLET | Freq: Every day | ORAL | 1 refills | Status: DC

## 2020-04-14 NOTE — Patient Instructions (Addendum)
Good to see you Prednisone 20 mg for 5 days Flexeril 5 mg at night  MRI of the hamstring mri femur See me again in 4 weeks we will be talking before that

## 2020-04-14 NOTE — Assessment & Plan Note (Addendum)
Patient does have a large partial tear of the left hamstring.  Patient's flexion is still intact.  Patient does still have what appears to be a 15 cm.  Noted.  I do believe secondary to the size of this that advanced imaging is warranted.  Patient is feeling like he is making some improvement and hopefully he will be able to continue coming to therapy.  Unable to do anti-inflammatories secondary to patient being a kidney donor recipient.  Short course of prednisone to get some relief and given a very small dose of Flexeril at night.  Warned him of the potential side effects.  Patient will see if this makes improvement.  Follow-up with me again in 4 weeks but we will be discussing with the advanced imaging that has been ordered.  Initially filled Zanaflex but secondary to kidney disease switch to Flexeril.

## 2020-04-14 NOTE — Addendum Note (Signed)
Addended by: Edwena Felty T on: 04/14/2020 10:03 AM   Modules accepted: Orders

## 2020-04-17 NOTE — Addendum Note (Signed)
Addended by: Edwena Felty T on: 04/17/2020 08:33 AM   Modules accepted: Orders

## 2020-05-01 ENCOUNTER — Other Ambulatory Visit: Payer: BC Managed Care – PPO

## 2020-05-05 ENCOUNTER — Encounter: Payer: Self-pay | Admitting: Family Medicine

## 2020-05-05 ENCOUNTER — Ambulatory Visit (INDEPENDENT_AMBULATORY_CARE_PROVIDER_SITE_OTHER): Payer: BC Managed Care – PPO | Admitting: Family Medicine

## 2020-05-11 ENCOUNTER — Other Ambulatory Visit: Payer: Self-pay

## 2020-05-11 ENCOUNTER — Ambulatory Visit (INDEPENDENT_AMBULATORY_CARE_PROVIDER_SITE_OTHER): Payer: BC Managed Care – PPO | Admitting: Physician Assistant

## 2020-05-11 ENCOUNTER — Encounter (INDEPENDENT_AMBULATORY_CARE_PROVIDER_SITE_OTHER): Payer: Self-pay | Admitting: Physician Assistant

## 2020-05-11 VITALS — BP 117/77 | HR 72 | Temp 98.0°F | Ht 76.0 in | Wt 228.0 lb

## 2020-05-11 DIAGNOSIS — I10 Essential (primary) hypertension: Secondary | ICD-10-CM | POA: Diagnosis not present

## 2020-05-11 DIAGNOSIS — E559 Vitamin D deficiency, unspecified: Secondary | ICD-10-CM | POA: Diagnosis not present

## 2020-05-11 DIAGNOSIS — E663 Overweight: Secondary | ICD-10-CM

## 2020-05-11 DIAGNOSIS — Z6827 Body mass index (BMI) 27.0-27.9, adult: Secondary | ICD-10-CM

## 2020-05-12 NOTE — Progress Notes (Signed)
Chief Complaint:   OBESITY Kailo is here to discuss his progress with his obesity treatment plan along with follow-up of his obesity related diagnoses. Payne is on the Category 3 Plan and states he is following his eating plan approximately 70% of the time. Moriah states he is doing 0 minutes 0 times per week.  Today's visit was #: 14 Starting weight: 285 lbs Starting date: 05/22/2019 Today's weight: 228 lbs Today's date: 05/11/2020 Total lbs lost to date: 57 Total lbs lost since last in-office visit: 0  Interim History: Aundra has not had an office visit since 03/10/2020. He reports that he is in a "maintenance phase". He does well with breakfast and lunch, but he is more lenient with dinner. He feels like snacking often.   Subjective:   1. Essential hypertension Aaryn's blood pressure is normal today. He is in less discomfort and no longer on prednisone. She is on hydrochlorothiazide.   2. Vitamin D deficiency Virlan is on OTC Vit D 5,000 units daily. Last Vit D level was at goal.  Assessment/Plan:   1. Essential hypertension Jermell will continue his medications, and his meal plan. He working on healthy weight loss and exercise to improve blood pressure control. We will watch for signs of hypotension as he continues his lifestyle modifications.  2. Vitamin D deficiency Low Vitamin D level contributes to fatigue and are associated with obesity, breast, and colon cancer. Bart agreed to continue taking OTC Vitamin D 5,000 IU daily, and we will recheck labs this month. He will follow-up for routine testing of Vitamin D, at least 2-3 times per year to avoid over-replacement.  3. Overweight with body mass index (BMI) of 27 to 27.9 in adult Brendon is currently in the action stage of change. As such, his goal is to continue with weight loss efforts. He has agreed to change to the Category 4 Plan.   Exercise goals: No exercise has been prescribed at this  time.  Behavioral modification strategies: meal planning and cooking strategies and planning for success.  Benjie has agreed to follow-up with our clinic in 2 to 3 weeks. He was informed of the importance of frequent follow-up visits to maximize his success with intensive lifestyle modifications for his multiple health conditions.   Objective:   Blood pressure 117/77, pulse 72, temperature 98 F (36.7 C), height 6\' 4"  (1.93 m), weight 228 lb (103.4 kg), SpO2 95 %. Body mass index is 27.75 kg/m.  General: Cooperative, alert, well developed, in no acute distress. HEENT: Conjunctivae and lids unremarkable. Cardiovascular: Regular rhythm.  Lungs: Normal work of breathing. Neurologic: No focal deficits.   Lab Results  Component Value Date   CREATININE 1.02 01/20/2020   BUN 26 (H) 01/20/2020   NA 143 01/20/2020   K 3.8 01/20/2020   CL 99 01/20/2020   CO2 30 (H) 01/20/2020   Lab Results  Component Value Date   ALT 39 01/20/2020   AST 27 01/20/2020   ALKPHOS 74 01/20/2020   BILITOT 1.0 01/20/2020   Lab Results  Component Value Date   HGBA1C 5.4 05/22/2019   HGBA1C 5.4 11/30/2018   HGBA1C 5.6 07/12/2017   HGBA1C 5.2 11/07/2014   Lab Results  Component Value Date   INSULIN 6.0 01/20/2020   INSULIN 14.6 05/22/2019   Lab Results  Component Value Date   TSH 2.970 05/22/2019   Lab Results  Component Value Date   CHOL 161 01/20/2020   HDL 47 01/20/2020   LDLCALC 89  01/20/2020   LDLDIRECT 114.0 03/14/2019   TRIG 144 01/20/2020   CHOLHDL 3.4 01/20/2020   Lab Results  Component Value Date   WBC 6.4 01/20/2020   HGB 14.8 01/20/2020   HCT 45.5 01/20/2020   MCV 93 01/20/2020   PLT 177 01/20/2020   No results found for: IRON, TIBC, FERRITIN  Attestation Statements:   Reviewed by clinician on day of visit: allergies, medications, problem list, medical history, surgical history, family history, social history, and previous encounter notes.  Time spent on visit  including pre-visit chart review and post-visit care and charting was 45 minutes.    Trude Mcburney, am acting as transcriptionist for [employer], PA-C.  I have reviewed the above documentation for accuracy and completeness, and I agree with the above. Alois Cliche, PA-C

## 2020-05-13 ENCOUNTER — Ambulatory Visit: Payer: Self-pay

## 2020-05-13 ENCOUNTER — Other Ambulatory Visit: Payer: Self-pay

## 2020-05-13 ENCOUNTER — Ambulatory Visit: Payer: BC Managed Care – PPO | Admitting: Family Medicine

## 2020-05-13 ENCOUNTER — Encounter: Payer: Self-pay | Admitting: Family Medicine

## 2020-05-13 VITALS — BP 128/80 | HR 57 | Ht 76.0 in | Wt 227.0 lb

## 2020-05-13 DIAGNOSIS — S76312A Strain of muscle, fascia and tendon of the posterior muscle group at thigh level, left thigh, initial encounter: Secondary | ICD-10-CM

## 2020-05-13 DIAGNOSIS — M79605 Pain in left leg: Secondary | ICD-10-CM | POA: Diagnosis not present

## 2020-05-13 NOTE — Assessment & Plan Note (Addendum)
Patient has shown significant improvement.  Patient is actually able to move the knee much better than previously.  Patient is having some instability and feels like when he is walking.  Discussed with patient again at great length.  Patient's insurance did deny the MRI.  We discussed the potential for referral to orthopedic surgery. Patient declined.  Patient feels like he is making good progress.  Would like to avoid starting physical therapy for another 2 weeks but then patient can start doing range of motion patient then will follow up with me again in 3 to 4 weeks

## 2020-05-13 NOTE — Progress Notes (Signed)
[name] [address] [place] [address] Phone: [phone] Subjective:   [name], am serving as a scribe for Dr. Antoine Primas. This visit occurred during the SARS-CoV-2 public health emergency.  Safety protocols were in place, including screening questions prior to the visit, additional usage of staff PPE, and extensive cleaning of exam room while observing appropriate contact time as indicated for disinfecting solutions.   I'm seeing this patient by the request  of:  [name], DO  CC: Left hamstring tear and follow-up  YWV:[id number]   04/14/2020 Patient does have a large partial tear of the left hamstring.  Patient's flexion is still intact.  Patient does still have what appears to be a 15 cm.  Noted.  I do believe secondary to the size of this that advanced imaging is warranted.  Patient is feeling like he is making some improvement and hopefully he will be able to continue coming to therapy.  Unable to do anti-inflammatories secondary to patient being a kidney donor recipient.  Short course of prednisone to get some relief and given a very small dose of Flexeril at night.  Warned him of the potential side effects.  Patient will see if this makes improvement.  Follow-up with me again in 4 weeks but we will be discussing with the advanced imaging that has been ordered.  Initially filled Zanaflex but secondary to kidney disease switch to Flexeril.   Update 05/13/2020 Seth Morales is a 60 y.o. male coming in with complaint of left hamstring tear.  Patient had a large hamstring tear previously.  On ultrasound seem to be getting worse.  Patient was to get an MRI but this was declined by his insurance..  Patient states that he is doing better. Tried to walk 1/2 mile this weekend. Felt like leg was going to hyperextend. Painful to touch but not with motion. Patient feels an unsteadiness with hills. Using Flexeril at night.  Patient states  that overall he does feel like he is doing significantly better.  Patient states daily activities have become near normal again more of the walking giving him trouble.       Past Medical History:  Diagnosis Date  . Allergy   . Anemia   . Back pain   . Bilateral swelling of feet   . Blood transfusion without reported diagnosis 13 years ago   . Chronic kidney disease   . Dermatitis   . Hypertension   . Joint pain   . Kidney transplanted   . Vitamin D deficiency    Past Surgical History:  Procedure Laterality Date  . COLONOSCOPY  09/06/2012   at [place]  . EYE SURGERY    . INSERTION OF DIALYSIS CATHETER    . KIDNEY TRANSPLANT  2006  . KNEE ARTHROPLASTY Bilateral   . NEPHRECTOMY Bilateral    Social History   Socioeconomic History  . Marital status: Married    Spouse name: Not on file  . Number of children: Not on file  . Years of education: Not on file  . Highest education level: Not on file  Occupational History  . Not on file  Tobacco Use  . Smoking status: Never Smoker  . Smokeless tobacco: Never Used  Vaping Use  . Vaping Use: Never used  Substance and Sexual Activity  . Alcohol use: Yes    Alcohol/week: 2.0 standard drinks    Types: 2 Standard drinks or equivalent per week  .  Drug use: No  . Sexual activity: Not on file  Other Topics Concern  . Not on file  Social History Narrative  . Not on file   Social Determinants of Health   Financial Resource Strain: Not on file  Food Insecurity: Not on file  Transportation Needs: Not on file  Physical Activity: Not on file  Stress: Not on file  Social Connections: Not on file   No Known Allergies Family History  Problem Relation Age of Onset  . Arthritis Mother   . Hypertension Mother   . Kidney disease Mother   . Diabetes Mother   . Diabetes Father   . Colon cancer Neg Hx   . Colon polyps Neg Hx   . Esophageal cancer Neg Hx   . Rectal cancer Neg Hx   . Stomach cancer Neg Hx     Current  Outpatient Medications (Endocrine & Metabolic):  .  predniSONE (DELTASONE) 5 MG tablet, Take 1 tablet (5 mg total) by mouth daily with breakfast.  Current Outpatient Medications (Cardiovascular):  .  hydrochlorothiazide (MICROZIDE) 12.5 MG capsule, Take 12.5 mg by mouth every other day.   Current Outpatient Medications (Analgesics):  .  allopurinol (ZYLOPRIM) 100 MG tablet, Take 50 mg by mouth daily.    Current Outpatient Medications (Other):  Marland Kitchen  Cholecalciferol (VITAMIN D) 125 MCG (5000 UT) CAPS, Take 5,000 Units by mouth daily.  .  cyclobenzaprine (FLEXERIL) 5 MG tablet, Take 1 tablet (5 mg total) by mouth at bedtime. .  cycloSPORINE modified (NEORAL) 100 MG capsule, Take 100 mg by mouth 2 (two) times daily. .  cycloSPORINE modified (NEORAL) 25 MG capsule,  .  magnesium oxide-pyridoxine (BEELITH) 362-20 MG TABS, Take 1 tablet by mouth daily. .  Multiple Vitamin (MULTI-VITAMIN) tablet, Take by mouth. .  mycophenolate (CELLCEPT) 250 MG capsule, Take 750 mg by mouth 2 (two) times daily. .  Omega-3 Fatty Acids (FISH OIL PO), Take by mouth. .  selenium sulfide (SELSUN) 2.5 % shampoo, Apply topically as directed. .  Sulfacetamide Sodium-Sulfur 8-4 % SUSP,    Reviewed prior external information including notes and imaging from  primary care provider As well as notes that were available from care everywhere and other healthcare systems.  Past medical history, social, surgical and family history all reviewed in electronic medical record.  No pertanent information unless stated regarding to the chief complaint.   Review of Systems:  No headache, visual changes, nausea, vomiting, diarrhea, constipation, dizziness, abdominal pain, skin rash, fevers, chills, night sweats, weight loss, swollen lymph nodes, body aches, joint swelling, chest pain, shortness of breath, mood changes. POSITIVE muscle aches  Objective  Blood pressure 128/80, pulse (!) 57, height 6\' 4"  (1.93 m), weight 227 lb (103  kg), SpO2 99 %.   General: No apparent distress alert and oriented x3 mood and affect normal, dressed appropriately.  HEENT: Pupils equal, extraocular movements intact  Respiratory: Patient's speak in full sentences and does not appear short of breath  Cardiovascular: No lower extremity edema, non tender, no erythema  Gait normal with good balance and coordination.  MSK: Patient's left hamstring does not show any significant swelling.  No significant atrophy noted either.  Patient does have some mild gapping in the mid substance noted.  Patient is still tender in this area.  Patient is low strength is significantly good and has 5 out of 5 strength actually in a seated position.  Mild pain with patient on any prone area against some resistance against a 90  degree flexed knee.  No pain in the ischial area and minimal discomfort at the insertion near the knee.  Limited musculoskeletal ultrasound was performed and interpreted Judi Saa  Limited ultrasound of patient's hamstring shows that there is some decrease in the surrounding hypoechoic changes of the muscle.  Patient still has the large defect noted but it is down from 15 cm to 12 cm.  Does show some potential improvement in the surrounding muscle health. Impression: Questionable interval healing of the hamstring tear    Impression and Recommendations:     The above documentation has been reviewed and is accurate and complete Judi Saa, DO

## 2020-05-13 NOTE — Patient Instructions (Signed)
Good to see you Looking good but don't push it too fast Doing well overall See me again in 3-4 weeks hopefully we can start PT

## 2020-05-21 ENCOUNTER — Other Ambulatory Visit: Payer: Self-pay

## 2020-05-21 ENCOUNTER — Ambulatory Visit: Payer: BC Managed Care – PPO | Attending: Family Medicine

## 2020-05-21 DIAGNOSIS — M79605 Pain in left leg: Secondary | ICD-10-CM | POA: Insufficient documentation

## 2020-05-21 DIAGNOSIS — R262 Difficulty in walking, not elsewhere classified: Secondary | ICD-10-CM | POA: Diagnosis present

## 2020-05-21 DIAGNOSIS — R600 Localized edema: Secondary | ICD-10-CM | POA: Diagnosis present

## 2020-05-21 DIAGNOSIS — M6281 Muscle weakness (generalized): Secondary | ICD-10-CM | POA: Insufficient documentation

## 2020-05-21 NOTE — Therapy (Signed)
[facility] [address] [place], [place], [address] Phone: [phone]   Fax:  [phone]  Physical Therapy Evaluation  Patient Details  Name: Seth Morales MRN: [id number] Date of Birth: [birth year] Referring Provider (PT): Antoine Primas, DO   Encounter Date: 05/21/2020   PT End of Session - 05/21/20 1152    Visit Number 1    Number of Visits 12    Date for PT Re-Evaluation 07/16/20    Authorization Type BCBS    PT Start Time 1017    PT Stop Time 1102    PT Time Calculation (min) 45 min    Activity Tolerance Patient tolerated treatment well    Behavior During Therapy [facility] for tasks assessed/performed           Past Medical History:  Diagnosis Date  . Allergy   . Anemia   . Back pain   . Bilateral swelling of feet   . Blood transfusion without reported diagnosis 13 years ago   . Chronic kidney disease   . Dermatitis   . Hypertension   . Joint pain   . Kidney transplanted   . Vitamin D deficiency     Past Surgical History:  Procedure Laterality Date  . COLONOSCOPY  09/06/2012   at [place]  . EYE SURGERY    . INSERTION OF DIALYSIS CATHETER    . KIDNEY TRANSPLANT  2006  . KNEE ARTHROPLASTY Bilateral   . NEPHRECTOMY Bilateral     There were no vitals filed for this visit.    Subjective Assessment - 05/21/20 1022    Subjective Pt reports he was wearing slippers and putting ice melt on the ice in January, when he slipped and felt a pop before he fell. He did the same thing going a gum wrapper away in his R hamstring 4 years ago. Nothing much bothers his leg right now, but he can tell he is learning to walk again and sometimes hyperextend. His DO says there is still a pocket of fluid in there - denied having it drained. He feels a little unsteady/weird walking up > downhill. 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 Bathing anddressing is getting easier and he can put his socks on now. Pt has lost a lot of weight in the last year and has lost a lot of leg/glute muscle mass.    Pertinent History Prior history of hamstring tears B LE, kidney transplant (other 2 were taken out 2 years later), B knee surgeries    Limitations Walking    How long can you walk comfortably? speed is limited, up > downhill feels "weird"    Diagnostic tests [place] L thigh: 16 cm in length very large midsubstance tear of the hamstring (originally 12 cm on 1/24, 16 cm 2/11)    Patient Stated Goals to get a little more flexibility and strength in B LE    Currently in Pain? No/denies    Pain Score 0-No pain    Pain Location Leg    Pain Orientation Left;Upper;Posterior    Pain Type Acute pain    Pain Onset More than a month ago    Pain Frequency Intermittent    Aggravating Factors  walking up/down hill    Pain Relieving Factors CBD gummies    Effect of Pain on Daily Activities ability to  ambulate to stage to play trumpet, take care of young dog              [facility] PT Assessment - 05/21/20 0001      Assessment   Medical Diagnosis Left Leg Pain    Referring Provider (PT) Antoine Primas, DO    Onset Date/Surgical Date 03/26/20    Hand Dominance Right    Next MD Visit 06/04/20    Prior Therapy Yes      Precautions   Precautions None      Restrictions   Weight Bearing Restrictions No      Balance Screen   Has the patient fallen in the past 6 months Yes    How many times? 1    Has the patient had a decrease in activity level because of a fear of falling?  Yes    Is the patient reluctant to leave their home because of a fear of falling?  No      Prior Function   Level of Independence Independent    Vocation Full time employment    Land to 4 universities/colleges to Insurance account manager, professional Designer, industrial/product, walking dog      Observation/Other Assessments    Focus on Therapeutic Outcomes (FOTO)  65% ability, 78% predicted      ROM / Strength   AROM / PROM / Strength AROM;Strength      AROM   AROM Assessment Site Knee    Right/Left Knee Right;Left    Right Knee Extension 5    Right Knee Flexion 134    Left Knee Extension 2    Left Knee Flexion 142      Strength   Strength Assessment Site Knee    Right/Left Hip --    Right/Left Knee Right;Left    Right Knee Flexion 4+/5    Right Knee Extension 4+/5    Left Knee Flexion 4+/5    Left Knee Extension 4+/5      Palpation   Patella mobility hypomobile R patella, L patella mild hypomobility all directions    Palpation comment small pocket of swelling in distal L hamstring                      Objective measurements completed on examination: See above findings.               PT Education - 05/21/20 1234    Education Details diagnosis, prognosis, POC, HEP, and FOTO    Person(s) Educated Patient    Methods Explanation;Demonstration;Tactile cues;Verbal cues;Handout   emailed program   Comprehension Verbalized understanding;Returned demonstration;Verbal cues required;Tactile cues required            PT Short Term Goals - 05/21/20 1239      PT SHORT TERM GOAL #1   Title Pt will be independent and compliant with initial HEP.    Time 2    Period Weeks    Status New    Target Date 06/04/20             PT Long Term Goals - 05/21/20 1239      PT LONG TERM GOAL #1   Title Pt will be independent with long term HEP for continued strengthening and to prevent recurrence of hamstring injury.    Time 8    Period Weeks    Status New    Target Date 07/16/20      PT LONG TERM  GOAL #2   Title Pt will increase L knee MMT to 5/5 in multiple ROM for improved knee stability.    Baseline 4+/5    Time 8    Period Weeks    Status New    Target Date 07/16/20      PT LONG TERM GOAL #3   Title Pt will report ability to ambulate on multiple surfaces of  level/unlevel terrain with no feeling of instability or knee hyperextension.    Baseline difficulty on inclines/declines    Time 8    Period Weeks    Status New    Target Date 07/16/20      PT LONG TERM GOAL #4   Title Pt will demonstrate proper deadlift form x 10 reps with 0-25#, good form and no pain for improved hamstring length and strength.    Baseline not tested    Time 8    Period Weeks    Status New    Target Date 07/16/20      PT LONG TERM GOAL #5   Title Pt will improve FOTO ability to 78%, in order to demonstrate significant improvement in perceived functional level of ability.    Baseline 65% ability    Time 8    Period Weeks    Status New    Target Date 07/16/20                  Plan - 05/21/20 1154    Clinical Impression Statement Pt is a 60 yo male who presents ~8 weeks post L hamstring tear, 12 cm in length on initial US on 1/24 and increased to 16 cm at 2nd US on 2/11. Pt has history of B hamstring tears and patellar dislocations with knee surgeries on B knees. There is a palpable area of swelling in midbelly hamstring more distally, but on reported pain. Pt has full AROM and 4+/5 strength via MMT without pain, but decreased quad/thigh girth. Pt limited in walking up and down inclines noting feeling of instability/abnormality with c/o intermittent knee hyperextension, suggesting poor eccentric hamstring control in stance phase at initial contact. 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 He reports his wife is taking care of his 59-year-old puppy right now as well, due to difficulty with walking and at times bending due to stretch on hamstring. He was educated on diagnosis, prognosis, POC, HEP, and FOTO. Pt verbalized understanding and consent to tx. Pt would benefit from skilled physical therapy 1-2x/week for 6-8 weeks to address hamstring flexibility, dynamic strength for improved  control with ambulation on uneven and changing terrain, and participation in IADLs as well as taking care of 59-year-old puppy.    Personal Factors and Comorbidities Age;Past/Current Experience;Comorbidity 2;Profession    Comorbidities HTN, kidney disease    Examination-Activity Limitations Squat;Locomotion Level;Bend    Examination-Participation Restrictions Interpersonal Relationship;Occupation;Community Activity    Stability/Clinical Decision Making Stable/Uncomplicated    Clinical Decision Making Low    Rehab Potential Good    PT Frequency --   1-2x/week   PT Duration --   6-8 weeks   PT Treatment/Interventions Dry needling;Cryotherapy;ADLs/Self Care Home Management;Electrical Stimulation;Iontophoresis 4mg /ml Dexamethasone;Moist Heat;Gait training;Stair training;Functional mobility training;Therapeutic activities;Therapeutic exercise;Balance training;Neuromuscular re-education;Patient/family education;Manual techniques;Passive range of motion;Vasopneumatic Device;Joint Manipulations    PT Next Visit Plan Assess respone to HEP/update PRN, test hamstring flexibility, progress gentle strengthening, could benefit from DN trial at some point  PT Home Exercise Plan [email or web address]    Consulted and Agree with Plan of Care Patient           Patient will benefit from skilled therapeutic intervention in order to improve the following deficits and impairments:  Decreased balance,Decreased endurance,Difficulty walking,Increased edema,Impaired perceived functional ability,Improper body mechanics,Decreased activity tolerance,Decreased strength,Increased fascial restricitons,Impaired flexibility,Pain  Visit Diagnosis: Pain in left leg  Muscle weakness (generalized)  Difficulty in walking, not elsewhere classified  Localized edema     Problem List Patient Active Problem List   Diagnosis Date Noted  . Partial tear of left hamstring 03/30/2020  . Vitamin D deficiency 09/25/2019  . Polycystic kidney  disease 11/13/14  . Essential hypertension 11/13/14  . Deceased-donor kidney transplant recipient 2014/11/13  . Gout 02/02/2011    Marcelline Mates, PT, DPT 05/21/2020, 1:04 PM  [facility] [address] [place], [place], [address] Phone: [phone]   Fax:  [phone]  Name: Seth Morales MRN: [id number] Date of Birth: 1960/04/03

## 2020-05-21 NOTE — Patient Instructions (Signed)
Access Code: [email or web address] URL: [email or web address] Date: 05/21/2020 Prepared by: Gardiner Rhyme  Exercises Supine Active Straight Leg Raise - 2 x daily - 7 x weekly - 2 sets - 12 reps - 2-3 seconds hold Prone Hip Extension - 1-2 x daily - 7 x weekly - 1-2 sets - 10 reps - 3-5 seconds hold Bridge - 1-2 x daily - 7 x weekly - 1-2 sets - 10-15 reps

## 2020-05-28 ENCOUNTER — Other Ambulatory Visit: Payer: Self-pay

## 2020-05-28 ENCOUNTER — Ambulatory Visit (INDEPENDENT_AMBULATORY_CARE_PROVIDER_SITE_OTHER): Payer: BC Managed Care – PPO | Admitting: Family Medicine

## 2020-05-28 ENCOUNTER — Encounter (INDEPENDENT_AMBULATORY_CARE_PROVIDER_SITE_OTHER): Payer: Self-pay | Admitting: Family Medicine

## 2020-05-28 VITALS — BP 110/73 | HR 83 | Temp 97.6°F | Ht 76.0 in | Wt 232.0 lb

## 2020-05-28 DIAGNOSIS — E669 Obesity, unspecified: Secondary | ICD-10-CM

## 2020-05-28 DIAGNOSIS — E559 Vitamin D deficiency, unspecified: Secondary | ICD-10-CM

## 2020-05-28 DIAGNOSIS — Z6830 Body mass index (BMI) 30.0-30.9, adult: Secondary | ICD-10-CM | POA: Diagnosis not present

## 2020-06-01 ENCOUNTER — Encounter (INDEPENDENT_AMBULATORY_CARE_PROVIDER_SITE_OTHER): Payer: Self-pay | Admitting: Family Medicine

## 2020-06-01 NOTE — Progress Notes (Signed)
Chief Complaint:   OBESITY Seth Morales is here to discuss his progress with his obesity treatment plan along with follow-up of his obesity related diagnoses. Seth Morales is on the Category 4 Plan and states he is following his eating plan approximately 60% of the time. [name] states he is not exercising regularly.  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  He is up 4 pounds today.  He is in weight maintenance, and his goal is to stay between 225-230 pounds.  He is at 232 pounds today.  He tends to get carry out but tries to make good choices.  Subjective:   1. Vitamin D deficiency Vitamin D was 96 in November 2021.  He reduced his OTC vitamin D intake.    Assessment/Plan:   1. Vitamin D deficiency Check vitamin D at next office visit.  Continue OTC vitamin D.  2. Class 1 obesity with serious comorbidity and body mass index (BMI) of 30.0 to 30.9 in adult, unspecified obesity type  [name] is currently in the action stage of change. As such, his goal is to maintain weight for now. He has agreed to the Category 4 Plan.   Exercise goals: Hold off until hamstring heals.  Behavioral modification strategies: decreasing simple carbohydrates and decreasing eating out.  Seth Morales has agreed to follow-up with our clinic in 4 weeks, fasting.   Objective:   Blood pressure 110/73, pulse 83, temperature 97.6 F (36.4 C), temperature source Oral, height 6\' 4"  (1.93 m), weight 232 lb (105.2 kg), SpO2 98 %. Body mass index is 28.24 kg/m.  General: Cooperative, alert, well developed, in no acute distress. HEENT: Conjunctivae and lids unremarkable. Cardiovascular: Regular rhythm.  Lungs: Normal work of  breathing. Neurologic: No focal deficits.   Lab Results  Component Value Date   CREATININE 1.02 01/20/2020   BUN 26 (H) 01/20/2020   NA 143 01/20/2020   K 3.8 01/20/2020   CL 99 01/20/2020   CO2 30 (H) 01/20/2020   Lab Results  Component Value Date   ALT 39 01/20/2020   AST 27 01/20/2020   ALKPHOS 74 01/20/2020   BILITOT 1.0 01/20/2020   Lab Results  Component Value Date   HGBA1C 5.4 05/22/2019   HGBA1C 5.4 11/30/2018   HGBA1C 5.6 07/12/2017   HGBA1C 5.2 11/07/2014   Lab Results  Component Value Date   INSULIN 6.0 01/20/2020   INSULIN 14.6 05/22/2019   Lab Results  Component Value Date   TSH 2.970 05/22/2019   Lab Results  Component Value Date   CHOL 161 01/20/2020   HDL 47 01/20/2020   LDLCALC 89 01/20/2020   LDLDIRECT 114.0 03/14/2019   TRIG 144 01/20/2020   CHOLHDL 3.4 01/20/2020   Lab Results  Component Value Date   WBC 6.4 01/20/2020   HGB 14.8 01/20/2020   HCT 45.5 01/20/2020   MCV 93 01/20/2020   PLT 177 01/20/2020   Attestation Statements:   Reviewed by clinician on day of visit: allergies, medications, problem list, medical history, surgical history, family history, social history, and previous encounter notes.  I, 01/22/2020, CMA, am acting as Insurance claims handler for Energy manager, FNP.  I have reviewed the above documentation for accuracy and completeness, and I  agree with the above. -  Georgianne Fick, FNP

## 2020-06-03 NOTE — Progress Notes (Signed)
[name] [address] [place] [address] Phone: [phone] Subjective:   I Seth Morales am serving as a Neurosurgeon for Dr. Antoine Primas.  This visit occurred during the SARS-CoV-2 public health emergency.  Safety protocols were in place, including screening questions prior to the visit, additional usage of staff PPE, and extensive cleaning of exam room while observing appropriate contact time as indicated for disinfecting solutions.   I'm seeing this patient by the request  of:  Seth Mam, DO  CC:   WER:[id number]   04/14/2020 Patient does have a large partial tear of the left hamstring.  Patient's flexion is still intact.  Patient does still have what appears to be a 15 cm.  Noted.  I do believe secondary to the size of this that advanced imaging is warranted.  Patient is feeling like he is making some improvement and hopefully he will be able to continue coming to therapy.  Unable to do anti-inflammatories secondary to patient being a kidney donor recipient.  Short course of prednisone to get some relief and given a very small dose of Flexeril at night.  Warned him of the potential side effects.  Patient will see if this makes improvement.  Follow-up with me again in 4 weeks but we will be discussing with the advanced imaging that has been ordered.  Initially filled Zanaflex but secondary to kidney disease switch to Flexeril.   Update 06/04/2020 Seth Morales is a 60 y.o. male coming in with complaint of left hamstring pain. Patient went to one session of PT. States the hamstring is coming along. Some pain at times. Feels like the knee wants to hyperextend. Goes back to PT next week. States the right knee is giving him more trouble than the hamstring. States he was on the floor doing something when he felt a pop on the medial side of the knee. States his flooring is hard and feels any achy pain in the knee. This injury happened about a week ago.  States he has the most pain with walking. History of surgery on the knee. Has been using CBD oil, other topical medications and a brace for the knee.       Past Medical History:  Diagnosis Date  . Allergy   . Anemia   . Back pain   . Bilateral swelling of feet   . Blood transfusion without reported diagnosis 13 years ago   . Chronic kidney disease   . Dermatitis   . Hypertension   . Joint pain   . Kidney transplanted   . Vitamin D deficiency    Past Surgical History:  Procedure Laterality Date  . COLONOSCOPY  09/06/2012   at [place]  . EYE SURGERY    . INSERTION OF DIALYSIS CATHETER    . KIDNEY TRANSPLANT  2006  . KNEE ARTHROPLASTY Bilateral   . NEPHRECTOMY Bilateral    Social History   Socioeconomic History  . Marital status: Married    Spouse name: Not on file  . Number of children: Not on file  . Years of education: Not on file  . Highest education level: Not on file  Occupational History  . Not on file  Tobacco Use  . Smoking status: Never Smoker  . Smokeless tobacco: Never Used  Vaping Use  . Vaping Use: Never used  Substance and Sexual Activity  . Alcohol use: Yes    Alcohol/week: 2.0 standard drinks    Types: 2 Standard  drinks or equivalent per week  . Drug use: No  . Sexual activity: Not on file  Other Topics Concern  . Not on file  Social History Narrative  . Not on file   Social Determinants of Health   Financial Resource Strain: Not on file  Food Insecurity: Not on file  Transportation Needs: Not on file  Physical Activity: Not on file  Stress: Not on file  Social Connections: Not on file   No Known Allergies Family History  Problem Relation Age of Onset  . Arthritis Mother   . Hypertension Mother   . Kidney disease Mother   . Diabetes Mother   . Diabetes Father   . Colon cancer Neg Hx   . Colon polyps Neg Hx   . Esophageal cancer Neg Hx   . Rectal cancer Neg Hx   . Stomach cancer Neg Hx     Current Outpatient Medications  (Endocrine & Metabolic):  .  predniSONE (DELTASONE) 5 MG tablet, Take 1 tablet (5 mg total) by mouth daily with breakfast.  Current Outpatient Medications (Cardiovascular):  .  hydrochlorothiazide (MICROZIDE) 12.5 MG capsule, Take 12.5 mg by mouth every other day.   Current Outpatient Medications (Analgesics):  .  allopurinol (ZYLOPRIM) 100 MG tablet, Take 50 mg by mouth daily.    Current Outpatient Medications (Other):  Marland Kitchen  Cholecalciferol (VITAMIN D) 125 MCG (5000 UT) CAPS, Take 5,000 Units by mouth daily.  .  cyclobenzaprine (FLEXERIL) 5 MG tablet, Take 1 tablet (5 mg total) by mouth at bedtime. .  cycloSPORINE modified (NEORAL) 100 MG capsule, Take 100 mg by mouth 2 (two) times daily. .  cycloSPORINE modified (NEORAL) 25 MG capsule,  .  magnesium oxide-pyridoxine (BEELITH) 362-20 MG TABS, Take 1 tablet by mouth daily. .  Multiple Vitamin (MULTI-VITAMIN) tablet, Take by mouth. .  mycophenolate (CELLCEPT) 250 MG capsule, Take 750 mg by mouth 2 (two) times daily. .  Omega-3 Fatty Acids (FISH OIL PO), Take by mouth. .  selenium sulfide (SELSUN) 2.5 % shampoo, Apply topically as directed. .  Sulfacetamide Sodium-Sulfur 8-4 % SUSP,    Reviewed prior external information including notes and imaging from  primary care provider As well as notes that were available from care everywhere and other healthcare systems.  Past medical history, social, surgical and family history all reviewed in electronic medical record.  No pertanent information unless stated regarding to the chief complaint.   Review of Systems:  No headache, visual changes, nausea, vomiting, diarrhea, constipation, dizziness, abdominal pain, skin rash, fevers, chills, night sweats, weight loss, swollen lymph nodes, body aches, joint swelling, chest pain, shortness of breath, mood changes. POSITIVE muscle aches  Objective  Blood pressure 138/80, pulse 75, height 6\' 4"  (1.93 m), weight 241 lb (109.3 kg), SpO2 97 %.   General:  No apparent distress alert and oriented x3 mood and affect normal, dressed appropriately.  HEENT: Pupils equal, extraocular movements intact  Respiratory: Patient's speak in full sentences and does not appear short of breath  Cardiovascular: No lower extremity edema, non tender, no erythema  Gait normal with good balance and coordination.  MSK: Left hamstring does have some mild atrophy compared to the contralateral side.  Patient strength though is doing very well.  Mild discomfort on the mid substance of the hamstring noted.  Right knee shows the patient does have swelling noted the patellofemoral joint.  Patient lacks the last 5 degrees of flexion.  No significant instability.  Pain over the anterior medial aspect  of the knee.  Limited musculoskeletal ultrasound was performed and interpreted by Judi Saa  Limited ultrasound of patient's right knee shows the patient does have patellofemoral narrowing noted.  No significant effusion of the patellofemoral joint noted.  Synovitis noted.  Patient does have a acute abnormality of the anterior medial aspect of the tibia.  Does have some mild callus formation starting to form now. 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     Impression and Recommendations:     The above documentation has been reviewed and is accurate and complete Judi Saa, DO

## 2020-06-04 ENCOUNTER — Ambulatory Visit: Payer: Self-pay

## 2020-06-04 ENCOUNTER — Ambulatory Visit (INDEPENDENT_AMBULATORY_CARE_PROVIDER_SITE_OTHER): Payer: BC Managed Care – PPO | Admitting: Family Medicine

## 2020-06-04 ENCOUNTER — Other Ambulatory Visit: Payer: Self-pay

## 2020-06-04 ENCOUNTER — Encounter: Payer: Self-pay | Admitting: Family Medicine

## 2020-06-04 VITALS — BP 138/80 | HR 75 | Ht 76.0 in | Wt 241.0 lb

## 2020-06-04 DIAGNOSIS — M25461 Effusion, right knee: Secondary | ICD-10-CM | POA: Diagnosis not present

## 2020-06-04 DIAGNOSIS — S76312A Strain of muscle, fascia and tendon of the posterior muscle group at thigh level, left thigh, initial encounter: Secondary | ICD-10-CM

## 2020-06-04 DIAGNOSIS — M25561 Pain in right knee: Secondary | ICD-10-CM

## 2020-06-04 NOTE — Assessment & Plan Note (Signed)
Patient on the ultrasound shows the patient does have an effusion noted of the knee of the patellofemoral joint.  Patient also has since synovitis noted.  Mild Baker's cyst noted.  Patient also in the area of the most pain may have a very small irregularity of the bone that could be consistent with a small fracture but seems to be healing with callus formation.  Discussed with patient about icing regimen, home exercises, which activities to do which wants to avoid.  At follow-up we will consider the possibility of injection as well as getting x-rays.  Unfortunately unable to do that today secondary to difficulties with the x-rays.

## 2020-06-04 NOTE — Assessment & Plan Note (Signed)
Patient has a very large tear of the hamstring and doing well.  Will be starting with formal physical therapy significantly next week.  Would like him to do that fairly regularly for the next 4 to 6 weeks.  Patient will follow up at the end of that time and we will reultrasound and see how patient is doing.

## 2020-06-04 NOTE — Patient Instructions (Addendum)
Good to see you Knee has swelling-Xray next visit Let's see how you do with PT Put band around knees with your bridge See me again in 5-6 weeks

## 2020-06-11 ENCOUNTER — Encounter: Payer: Self-pay | Admitting: Physical Therapy

## 2020-06-11 ENCOUNTER — Other Ambulatory Visit: Payer: Self-pay

## 2020-06-11 ENCOUNTER — Ambulatory Visit: Payer: BC Managed Care – PPO | Attending: Family Medicine | Admitting: Physical Therapy

## 2020-06-11 DIAGNOSIS — R262 Difficulty in walking, not elsewhere classified: Secondary | ICD-10-CM | POA: Insufficient documentation

## 2020-06-11 DIAGNOSIS — M6281 Muscle weakness (generalized): Secondary | ICD-10-CM

## 2020-06-11 DIAGNOSIS — M79605 Pain in left leg: Secondary | ICD-10-CM | POA: Diagnosis present

## 2020-06-11 DIAGNOSIS — R600 Localized edema: Secondary | ICD-10-CM

## 2020-06-11 NOTE — Therapy (Signed)
[facility] [address] [place], [place], [address] Phone: [phone]   Fax:  [phone]  Physical Therapy Treatment  Patient Details  Name: MOREY ANDONIAN MRN: [id number] Date of Birth: 02/05/1961 Referring Provider (PT): Antoine Primas, DO   Encounter Date: 06/11/2020   PT End of Session - 06/11/20 0842    Visit Number 2    Number of Visits 12    Date for PT Re-Evaluation 07/16/20    Authorization Type BCBS    PT Start Time 0800    PT Stop Time 0844    PT Time Calculation (min) 44 min    Activity Tolerance Patient tolerated treatment well    Behavior During Therapy [facility] for tasks assessed/performed           Past Medical History:  Diagnosis Date  . Allergy   . Anemia   . Back pain   . Bilateral swelling of feet   . Blood transfusion without reported diagnosis 13 years ago   . Chronic kidney disease   . Dermatitis   . Hypertension   . Joint pain   . Kidney transplanted   . Vitamin D deficiency     Past Surgical History:  Procedure Laterality Date  . COLONOSCOPY  09/06/2012   at [place]  . EYE SURGERY    . INSERTION OF DIALYSIS CATHETER    . KIDNEY TRANSPLANT  2006  . KNEE ARTHROPLASTY Bilateral   . NEPHRECTOMY Bilateral     There were no vitals filed for this visit.   Subjective Assessment - 06/11/20 0801    Subjective Getting around ok. Pt reports that MD found a fracture in R knee that's is almost healed.    Pertinent History Prior history of hamstring tears B LE, kidney transplant (other 2 were taken out 2 years later), B knee surgeries    Currently in Pain? No/denies                             [facility] Adult PT Treatment/Exercise - 06/11/20 0001      Exercises   Exercises Knee/Hip      Knee/Hip Exercises: Stretches   Passive Hamstring Stretch Both;4 reps;10 seconds      Knee/Hip Exercises: Aerobic   Recumbent Bike L2.7 x      Knee/Hip Exercises: Machines for  Strengthening   Cybex Knee Extension 10lb 2x10    Cybex Knee Flexion 25lb 2x12    Cybex Leg Press 60lb 2x10      Knee/Hip Exercises: Seated   Sit to Sand 10 reps;without UE support;1 set;5 reps   elevated Mat table     Knee/Hip Exercises: Supine   Bridges Strengthening;1 set;Both;10 reps    Bridges with [employer] 1 set;15 reps;Strengthening;Both    Other Supine Knee/Hip Exercises Bridge W/ march 2x10 tatal                    PT Short Term Goals - 06/11/20 0844      PT SHORT TERM GOAL #1   Title Pt will be independent and compliant with initial HEP.    Status Achieved             PT Long Term Goals - 05/21/20 1239      PT LONG TERM GOAL #1   Title Pt will be independent with long term HEP for continued strengthening and to prevent recurrence of hamstring injury.  Time 8    Period Weeks    Status New    Target Date 07/16/20      PT LONG TERM GOAL #2   Title Pt will increase L knee MMT to 5/5 in multiple ROM for improved knee stability.    Baseline 4+/5    Time 8    Period Weeks    Status New    Target Date 07/16/20      PT LONG TERM GOAL #3   Title Pt will report ability to ambulate on multiple surfaces of level/unlevel terrain with no feeling of instability or knee hyperextension.    Baseline difficulty on inclines/declines    Time 8    Period Weeks    Status New    Target Date 07/16/20      PT LONG TERM GOAL #4   Title Pt will demonstrate proper deadlift form x 10 reps with 0-25#, good form and no pain for improved hamstring length and strength.    Baseline not tested    Time 8    Period Weeks    Status New    Target Date 07/16/20      PT LONG TERM GOAL #5   Title Pt will improve FOTO ability to 78%, in order to demonstrate significant improvement in perceived functional level of ability.    Baseline 65% ability    Time 8    Period Weeks    Status New    Target Date 07/16/20                 Plan - 06/11/20 0842    Clinical  Impression Statement Pt did well completing all of today's interventions. Pt did well controlling resisted gait. Decrease TKE noted with step ups. Glute weakness present with sit to stands, weakness also present with supine bridges with marching. Audible R knee popping present with leg extensions.    Personal Factors and Comorbidities Age;Past/Current Experience;Comorbidity 2;Profession    Comorbidities HTN, kidney disease    Examination-Activity Limitations Squat;Locomotion Level;Bend    Examination-Participation Restrictions Interpersonal Relationship;Occupation;Community Activity    Stability/Clinical Decision Making Stable/Uncomplicated    Rehab Potential Good    PT Treatment/Interventions Dry needling;Cryotherapy;ADLs/Self Care Home Management;Electrical Stimulation;Iontophoresis 4mg /ml Dexamethasone;Moist Heat;Gait training;Stair training;Functional mobility training;Therapeutic activities;Therapeutic exercise;Balance training;Neuromuscular re-education;Patient/family education;Manual techniques;Passive range of motion;Vasopneumatic Device;Joint Manipulations    PT Next Visit Plan Assess respone to HEP/update PRN, test hamstring flexibility, progress gentle strengthening, could benefit from DN trial at some point           Patient will benefit from skilled therapeutic intervention in order to improve the following deficits and impairments:  Decreased balance,Decreased endurance,Difficulty walking,Increased edema,Impaired perceived functional ability,Improper body mechanics,Decreased activity tolerance,Decreased strength,Increased fascial restricitons,Impaired flexibility,Pain  Visit Diagnosis: Difficulty in walking, not elsewhere classified  Localized edema  Pain in left leg  Muscle weakness (generalized)     Problem List Patient Active Problem List   Diagnosis Date Noted  . Pain and swelling of right knee 06/04/2020  . Partial tear of left hamstring 03/30/2020  . Vitamin D  deficiency 09/25/2019  . Polycystic kidney disease Dec 02, 2014  . Essential hypertension December 02, 2014  . Deceased-donor kidney transplant recipient 02-Dec-2014  . Gout 02/02/2011    02/04/2011 06/11/2020, 8:45 AM  [facility] [address] [place], [place], [place] Phone: [phone]   Fax:  [phone]  Name: NAFTULI DALSANTO MRN: [name] Date of Birth: [birth year]

## 2020-06-16 ENCOUNTER — Encounter: Payer: Self-pay | Admitting: Physical Therapy

## 2020-06-16 ENCOUNTER — Other Ambulatory Visit: Payer: Self-pay

## 2020-06-16 ENCOUNTER — Ambulatory Visit: Payer: BC Managed Care – PPO | Admitting: Physical Therapy

## 2020-06-16 DIAGNOSIS — R262 Difficulty in walking, not elsewhere classified: Secondary | ICD-10-CM

## 2020-06-16 DIAGNOSIS — R600 Localized edema: Secondary | ICD-10-CM

## 2020-06-16 DIAGNOSIS — M6281 Muscle weakness (generalized): Secondary | ICD-10-CM

## 2020-06-16 DIAGNOSIS — M79605 Pain in left leg: Secondary | ICD-10-CM

## 2020-06-16 NOTE — Therapy (Signed)
[place]. [place], [place], [address] Phone: [phone]   Fax:  [phone]  Physical Therapy Treatment  Patient Details  Name: Seth Morales MRN: [id number] Date of Birth: 11-Oct-1960 Referring Provider (PT): Hulan Saas, DO   Encounter Date: 06/16/2020   PT End of Session - 06/16/20 0842    Visit Number 3    Number of Visits 12    Date for PT Re-Evaluation 07/16/20    Authorization Type BCBS    PT Start Time 0802    PT Stop Time 0844    PT Time Calculation (min) 42 min    Activity Tolerance Patient tolerated treatment well    Behavior During Therapy [facility] for tasks assessed/performed           Past Medical History:  Diagnosis Date  . Allergy   . Anemia   . Back pain   . Bilateral swelling of feet   . Blood transfusion without reported diagnosis 13 years ago   . Chronic kidney disease   . Dermatitis   . Hypertension   . Joint pain   . Kidney transplanted   . Vitamin D deficiency     Past Surgical History:  Procedure Laterality Date  . COLONOSCOPY  09/06/2012   at [facility]    . INSERTION OF DIALYSIS CATHETER    . KIDNEY TRANSPLANT  2006  . KNEE ARTHROPLASTY Bilateral   . NEPHRECTOMY Bilateral     There were no vitals filed for this visit.   Subjective Assessment - 06/16/20 0802    Subjective some soreness in L leg from walking more    Currently in Pain? No/denies                             [facility] Adult PT Treatment/Exercise - 06/16/20 0001      Knee/Hip Exercises: Aerobic   Recumbent Bike L2.9 x 59mn      Knee/Hip Exercises: Machines for Strengthening   Cybex Knee Extension 15lb 2x12,  LLE 5lb x10    Cybex Knee Flexion 35lb 2x12    Cybex Leg Press 60lb 3x10      Knee/Hip Exercises: Standing   Heel Raises 2 sets;Both;15 reps;2 seconds    Lateral Step Up Both;1 set;10 reps;Hand Hold: 0;Step Height: 6"    Forward Step Up Both;1 set;10  reps;Hand Hold: 0;Step Height: 6"    Walking with Sports Cord 40lb 4 way x 4 each      Knee/Hip Exercises: Seated   Long Arc Quad Right;1 set;10 reps;Weights    Long Arc Quad Weight 2 lbs.    Sit to Sand 10 reps;without UE support;2 sets   holding yellow ball                   PT Short Term Goals - 06/11/20 0844      PT SHORT TERM GOAL #1   Title Pt will be independent and compliant with initial HEP.    Status Achieved             PT Long Term Goals - 06/16/20 0815      PT LONG TERM GOAL #3   Title Pt will report ability to ambulate on multiple surfaces of level/unlevel terrain with no feeling of instability or knee hyperextension.    Status Partially Met  Plan - 06/16/20 0842    Clinical Impression Statement Progressed with more functional interventions. Some R knee pain report with sit to stands, completed all step up interventions well but stated he felt some instability. Good eccentric control with resisted gait. unable to complete SL extensions with low weight on machine.    Personal Factors and Comorbidities Age;Past/Current Experience;Comorbidity 2;Profession    Comorbidities HTN, kidney disease    Examination-Activity Limitations Squat;Locomotion Level;Bend    Examination-Participation Restrictions Interpersonal Relationship;Occupation;Community Activity    Stability/Clinical Decision Making Stable/Uncomplicated    Rehab Potential Good    PT Treatment/Interventions Dry needling;Cryotherapy;ADLs/Self Care Home Management;Electrical Stimulation;Iontophoresis 39m/ml Dexamethasone;Moist Heat;Gait training;Stair training;Functional mobility training;Therapeutic activities;Therapeutic exercise;Balance training;Neuromuscular re-education;Patient/family education;Manual techniques;Passive range of motion;Vasopneumatic Device;Joint Manipulations    PT Next Visit Plan hamstring flexibility, progress gentle strengthening, could benefit from DN trial  at some point           Patient will benefit from skilled therapeutic intervention in order to improve the following deficits and impairments:  Decreased balance,Decreased endurance,Difficulty walking,Increased edema,Impaired perceived functional ability,Improper body mechanics,Decreased activity tolerance,Decreased strength,Increased fascial restricitons,Impaired flexibility,Pain  Visit Diagnosis: Localized edema  Muscle weakness (generalized)  Pain in left leg  Difficulty in walking, not elsewhere classified     Problem List Patient Active Problem List   Diagnosis Date Noted  . Pain and swelling of right knee 06/04/2020  . Partial tear of left hamstring 03/30/2020  . Vitamin D deficiency 09/25/2019  . Polycystic kidney disease 009-30-2016 . Essential hypertension 009-30-16 . Deceased-donor kidney transplant recipient 009/30/16 . Gout 02/02/2011    RScot Jun PTA 06/16/2020, 8:44 AM  [place] [id number]: [phone]  Fax:  [phone] Name: Seth RECUPEROMRN: [id number] of Birth: [birth year]

## 2020-06-18 ENCOUNTER — Encounter: Payer: Self-pay | Admitting: Physical Therapy

## 2020-06-18 ENCOUNTER — Ambulatory Visit: Payer: BC Managed Care – PPO | Admitting: Physical Therapy

## 2020-06-18 ENCOUNTER — Other Ambulatory Visit: Payer: Self-pay

## 2020-06-18 DIAGNOSIS — M79605 Pain in left leg: Secondary | ICD-10-CM

## 2020-06-18 DIAGNOSIS — R262 Difficulty in walking, not elsewhere classified: Secondary | ICD-10-CM

## 2020-06-18 DIAGNOSIS — R600 Localized edema: Secondary | ICD-10-CM

## 2020-06-18 DIAGNOSIS — M6281 Muscle weakness (generalized): Secondary | ICD-10-CM

## 2020-06-18 NOTE — Therapy (Signed)
[place]. [place], [place], [address] Phone: [phone]   Fax:  [phone]  Physical Therapy Treatment  Patient Details  Name: Seth Morales MRN: [id number] Date of Birth: 15-Feb-1961 Referring Provider (PT): Hulan Saas, DO   Encounter Date: 06/18/2020   PT End of Session - 06/18/20 1012    Visit Number 4    Number of Visits 12    Date for PT Re-Evaluation 07/16/20    Authorization Type BCBS    PT Start Time 0930    PT Stop Time 1015    PT Time Calculation (min) 45 min    Activity Tolerance Patient tolerated treatment well    Behavior During Therapy [facility] for tasks assessed/performed           Past Medical History:  Diagnosis Date  . Allergy   . Anemia   . Back pain   . Bilateral swelling of feet   . Blood transfusion without reported diagnosis 13 years ago   . Chronic kidney disease   . Dermatitis   . Hypertension   . Joint pain   . Kidney transplanted   . Vitamin D deficiency     Past Surgical History:  Procedure Laterality Date  . COLONOSCOPY  09/06/2012   at [place]    . INSERTION OF DIALYSIS CATHETER    . KIDNEY TRANSPLANT  2006  . KNEE ARTHROPLASTY Bilateral   . NEPHRECTOMY Bilateral     There were no vitals filed for this visit.   Subjective Assessment - 06/18/20 0930    Subjective Knee feel fine but has not put it to the test. Reports pulling his back on R side playing with Dog    Currently in Pain? No/denies                             [facility] Adult PT Treatment/Exercise - 06/18/20 0001      Knee/Hip Exercises: Aerobic   Recumbent Bike L2.9 x 72mn      Knee/Hip Exercises: Machines for Strengthening   Cybex Knee Extension 15lb 2x15,  LLE 5lb x10    Cybex Knee Flexion 35lb 2x15    Cybex Leg Press 70lb 3x12      Knee/Hip Exercises: Standing   Heel Raises 2 sets;Both;15 reps;2 seconds    Lateral Step Up Both;1 set;10 reps;Hand  Hold: 0;Step Height: 8"    Forward Step Up Both;1 set;10 reps;Hand Hold: 0;Step Height: 8"    Step Down Both;1 set;10 reps;Hand Hold: 1;Step Height: 4"   eccentrics   Walking with Sports Cord 40lb side step over 1/2 foam roll x 10 each      Knee/Hip Exercises: Seated   Long Arc Quad Right;10 reps;Weights;2 sets    LIllinois Tool WorksWeight 2 lbs.                    PT Short Term Goals - 06/11/20 0844      PT SHORT TERM GOAL #1   Title Pt will be independent and compliant with initial HEP.    Status Achieved             PT Long Term Goals - 06/18/20 00712     PT LONG TERM GOAL #1   Title Pt will be independent with long term HEP for continued strengthening and to prevent recurrence of hamstring injury.    Status  Achieved      PT LONG TERM GOAL #2   Title Pt will increase L knee MMT to 5/5 in multiple ROM for improved knee stability.    Status On-going      PT LONG TERM GOAL #3   Title Pt will report ability to ambulate on multiple surfaces of level/unlevel terrain with no feeling of instability or knee hyperextension.    Status Partially Met      PT LONG TERM GOAL #4   Title Pt will demonstrate proper deadlift form x 10 reps with 0-25#, good form and no pain for improved hamstring length and strength.    Status On-going                 Plan - 06/18/20 1012    Clinical Impression Statement Pt has progressed towards LTG's. Pt abe to complete step ups from higher surface, some compensation noted doing lateral step ups with RLE. Still unable to complete RLE extension  on machine. Eccentric step downs did cause some knee discomfort. No issue with increase resistance on leg press.    Personal Factors and Comorbidities Age;Past/Current Experience;Comorbidity 2;Profession    Comorbidities HTN, kidney disease    Examination-Activity Limitations Squat;Locomotion Level;Bend    Examination-Participation Restrictions Interpersonal Relationship;Occupation;Community  Activity    Stability/Clinical Decision Making Stable/Uncomplicated    Rehab Potential Good           Patient will benefit from skilled therapeutic intervention in order to improve the following deficits and impairments:  Decreased balance,Decreased endurance,Difficulty walking,Increased edema,Impaired perceived functional ability,Improper body mechanics,Decreased activity tolerance,Decreased strength,Increased fascial restricitons,Impaired flexibility,Pain  Visit Diagnosis: Localized edema  Pain in left leg  Difficulty in walking, not elsewhere classified  Muscle weakness (generalized)     Problem List Patient Active Problem List   Diagnosis Date Noted  . Pain and swelling of right knee 06/04/2020  . Partial tear of left hamstring 03/30/2020  . Vitamin D deficiency 09/25/2019  . Polycystic kidney disease Nov 21, 2014  . Essential hypertension 11-21-14  . Deceased-donor kidney transplant recipient 2014-11-21  . Gout 02/02/2011    Scot Jun, PTA 06/18/2020, 10:15 AM  [place]. [place], [place], [address] Phone: [phone]   Fax:  [phone]  Name: Seth Morales MRN: [id number] Date of Birth: 1960/09/11

## 2020-06-25 ENCOUNTER — Encounter (INDEPENDENT_AMBULATORY_CARE_PROVIDER_SITE_OTHER): Payer: Self-pay | Admitting: Family Medicine

## 2020-06-25 ENCOUNTER — Other Ambulatory Visit: Payer: Self-pay

## 2020-06-25 ENCOUNTER — Ambulatory Visit (INDEPENDENT_AMBULATORY_CARE_PROVIDER_SITE_OTHER): Payer: BC Managed Care – PPO | Admitting: Family Medicine

## 2020-06-25 VITALS — BP 121/77 | HR 62 | Temp 97.4°F | Ht 76.0 in | Wt 232.0 lb

## 2020-06-25 DIAGNOSIS — E669 Obesity, unspecified: Secondary | ICD-10-CM | POA: Insufficient documentation

## 2020-06-25 DIAGNOSIS — E8881 Metabolic syndrome: Secondary | ICD-10-CM

## 2020-06-25 DIAGNOSIS — Z9189 Other specified personal risk factors, not elsewhere classified: Secondary | ICD-10-CM

## 2020-06-25 DIAGNOSIS — Z6833 Body mass index (BMI) 33.0-33.9, adult: Secondary | ICD-10-CM

## 2020-06-25 DIAGNOSIS — E88819 Insulin resistance, unspecified: Secondary | ICD-10-CM

## 2020-06-25 DIAGNOSIS — E785 Hyperlipidemia, unspecified: Secondary | ICD-10-CM | POA: Diagnosis not present

## 2020-06-25 DIAGNOSIS — E559 Vitamin D deficiency, unspecified: Secondary | ICD-10-CM

## 2020-06-25 NOTE — Progress Notes (Signed)
The 10-year ASCVD risk score Denman George DC Montez Hageman., et al., 2013) is: 7.2%   Values used to calculate the score:     Age: 60 years     Sex: Male     Is Non-Hispanic African American: No     Diabetic: No     Tobacco smoker: No     Systolic Blood Pressure: 121 mmHg     Is BP treated: Yes     HDL Cholesterol: 47 mg/dL     Total Cholesterol: 161 mg/dL

## 2020-06-26 LAB — VITAMIN D 25 HYDROXY (VIT D DEFICIENCY, FRACTURES): Vit D, 25-Hydroxy: 61.9 ng/mL (ref 30.0–100.0)

## 2020-06-26 LAB — LIPID PANEL WITH LDL/HDL RATIO
Cholesterol, Total: 180 mg/dL (ref 100–199)
HDL: 51 mg/dL (ref >39)
LDL Chol Calc (NIH): 109 mg/dL — ABNORMAL HIGH (ref 0–99)
LDL/HDL Ratio: 2.1 ratio (ref 0.0–3.6)
Triglycerides: 113 mg/dL (ref 0–149)
VLDL Cholesterol Cal: 20 mg/dL (ref 5–40)

## 2020-06-26 LAB — COMPREHENSIVE METABOLIC PANEL
ALT: 26 IU/L (ref 0–44)
AST: 23 IU/L (ref 0–40)
Albumin/Globulin Ratio: 2 (ref 1.2–2.2)
Albumin: 4.1 g/dL (ref 3.8–4.9)
Alkaline Phosphatase: 70 IU/L (ref 44–121)
BUN/Creatinine Ratio: 18 (ref 9–20)
BUN: 21 mg/dL (ref 6–24)
Bilirubin Total: 0.8 mg/dL (ref 0.0–1.2)
CO2: 25 mmol/L (ref 20–29)
Calcium: 9.2 mg/dL (ref 8.7–10.2)
Chloride: 102 mmol/L (ref 96–106)
Creatinine, Ser: 1.2 mg/dL (ref 0.76–1.27)
Globulin, Total: 2.1 g/dL (ref 1.5–4.5)
Glucose: 99 mg/dL (ref 65–99)
Potassium: 3.9 mmol/L (ref 3.5–5.2)
Sodium: 143 mmol/L (ref 134–144)
Total Protein: 6.2 g/dL (ref 6.0–8.5)
eGFR: 70 mL/min/{1.73_m2} (ref >59)

## 2020-06-26 LAB — INSULIN, RANDOM: INSULIN: 8.5 u[IU]/mL (ref 2.6–24.9)

## 2020-06-26 LAB — HEMOGLOBIN A1C
Est. average glucose Bld gHb Est-mCnc: 111 mg/dL
Hgb A1c MFr Bld: 5.5 % (ref 4.8–5.6)

## 2020-06-29 ENCOUNTER — Encounter (INDEPENDENT_AMBULATORY_CARE_PROVIDER_SITE_OTHER): Payer: Self-pay | Admitting: Family Medicine

## 2020-06-29 DIAGNOSIS — E88819 Insulin resistance, unspecified: Secondary | ICD-10-CM | POA: Insufficient documentation

## 2020-06-29 DIAGNOSIS — E8881 Metabolic syndrome: Secondary | ICD-10-CM | POA: Insufficient documentation

## 2020-06-29 NOTE — Progress Notes (Signed)
Chief Complaint:   OBESITY Seth Morales is here to discuss his progress with his obesity treatment plan along with follow-up of his obesity related diagnoses. Seth Morales is on the Category 4 Plan and states he is following his eating plan approximately 50% of the time. Seth Morales states he is doing PT and walking for 30-45 minutes 4 times per week.  Today's visit was #: 16 Starting weight: 285 lbs Starting date: 05/22/2019 Today's weight: 232 lbs Today's date: 06/25/2020 Total lbs lost to date: 53 lbs Total lbs lost since last in-office visit: 0  Interim History: Seth Morales traveled over the Easter holiday and found it difficult to stick to plan.  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  He says his schedule will calm down in a few weeks.   Subjective:   1. Insulin resistance Last fasting insulin slightly elevated at 6.0 (down from 14.6).  Lab Results  Component Value Date   INSULIN 6.0 01/20/2020   INSULIN 14.6 05/22/2019   2. Vitamin D deficiency Last vitamin D level at 96.6.  3. Dyslipidemia Last LDL was 89.  TG and HDL within normal limits.  ASCVD risk score is 7.21.  He is not on a statin.  Lab Results  Component Value Date   ALT 39 01/20/2020   AST 27 01/20/2020   ALKPHOS 74 01/20/2020   BILITOT 1.0 01/20/2020   Lab Results  Component Value Date   CHOL 161 01/20/2020   HDL 47 01/20/2020   LDLCALC 89  01/20/2020   LDLDIRECT 114.0 03/14/2019   TRIG 144 01/20/2020   CHOLHDL 3.4 01/20/2020   4. At risk for activity intolerance Seth Morales is at risk for exercise intolerance due to orthopedic issues.  Assessment/Plan:   1. Insulin resistance Check fasting glucose/insulin and A1c today.  - Comprehensive metabolic panel - Hemoglobin A1c - Insulin, random  2. Vitamin D deficiency Check vitamin D level today.  - VITAMIN D 25 Hydroxy (Vit-D Deficiency, Fractures)  3. Dyslipidemia Check FLP today.  - Lipid Panel With  LDL/HDL Ratio  4. At risk for activity intolerance Seth Morales was given approximately 15 minutes of exercise intolerance counseling today. He is 60 y.o. male and has risk factors exercise intolerance including obesity. We discussed intensive lifestyle modifications today with an emphasis on specific weight loss instructions and strategies. Seth Morales will slowly increase activity as tolerated.  Repetitive spaced learning was employed today to elicit superior memory formation and behavioral change.  5. Overweight: Current BMI 28  Seth Morales is currently in the action stage of change. As such, his goal is to continue with weight loss efforts. He has agreed to the Category 4 Plan or practicing portion control and making smarter food choices, such as increasing vegetables and decreasing simple carbohydrates.   His revised weight goal is 228-233 pounds.  Exercise goals: As is.  Behavioral modification strategies: increasing lean protein intake.  Seth Morales has agreed to follow-up with our clinic in 4 weeks.  Seth Morales was informed we would discuss his lab results at his next visit unless there is a critical issue that needs to be addressed sooner. Seth Morales agreed to keep his next visit at the agreed upon time to discuss these results.  Objective:   Blood pressure 121/77, pulse 62, temperature (!) 97.4 F (36.3 C), height 6\' 4"  (1.93 m), weight 232 lb (105.2 kg), SpO2 98 %. Body mass index is 28.24 kg/m.  General: Cooperative, alert, well developed, in  no acute distress. HEENT: Conjunctivae and lids unremarkable. Cardiovascular: Regular rhythm.  Lungs: Normal work of breathing. Neurologic: No focal deficits.   Lab Results  Component Value Date   ALT 39 01/20/2020   AST 27 01/20/2020   ALKPHOS 74 01/20/2020   BILITOT 1.0 01/20/2020   Lab Results  Component Value Date   CHOL 161 01/20/2020   HDL 47 01/20/2020   LDLCALC 89  01/20/2020   LDLDIRECT 114.0 03/14/2019   TRIG 144 01/20/2020    CHOLHDL 3.4 01/20/2020   Lab Results  Component Value Date   HGBA1C 5.4 05/22/2019   HGBA1C 5.4 11/30/2018   HGBA1C 5.6 07/12/2017   HGBA1C 5.2 11/07/2014   Lab Results  Component Value Date   INSULIN 6.0 01/20/2020   INSULIN 14.6 05/22/2019   Lab Results  Component Value Date   TSH 2.970 05/22/2019   Lab Results  Component Value Date   WBC 6.4 01/20/2020   HGB 14.8 01/20/2020   HCT 45.5 01/20/2020   MCV 93 01/20/2020   PLT 177 01/20/2020   Attestation Statements:   Reviewed by clinician on day of visit: allergies, medications, problem list, medical history, surgical history, family history, social history, and previous encounter notes.  I, Insurance claims handler, CMA, am acting as Energy manager for [place], FNP.  I have reviewed the above documentation for accuracy and completeness, and I agree with the above. -  Jesse Sans, FNP

## 2020-06-30 ENCOUNTER — Other Ambulatory Visit: Payer: Self-pay

## 2020-06-30 ENCOUNTER — Ambulatory Visit: Payer: BC Managed Care – PPO | Admitting: Rehabilitative and Restorative Service Providers"

## 2020-06-30 ENCOUNTER — Encounter: Payer: Self-pay | Admitting: Rehabilitative and Restorative Service Providers"

## 2020-06-30 DIAGNOSIS — R262 Difficulty in walking, not elsewhere classified: Secondary | ICD-10-CM | POA: Diagnosis not present

## 2020-06-30 DIAGNOSIS — M79605 Pain in left leg: Secondary | ICD-10-CM

## 2020-06-30 DIAGNOSIS — R600 Localized edema: Secondary | ICD-10-CM

## 2020-06-30 DIAGNOSIS — M6281 Muscle weakness (generalized): Secondary | ICD-10-CM

## 2020-06-30 NOTE — Therapy (Signed)
[place]. [place], [place], [address] Phone: [phone]   Fax:  [phone]  Physical Therapy Treatment  Patient Details  Name: Seth Morales MRN: [id number] Date of Birth: April 10, 1960 Referring Provider (PT): Hulan Saas, DO   Encounter Date: 06/30/2020   PT End of Session - 06/30/20 0855    Visit Number 5    Number of Visits 12    Date for PT Re-Evaluation 07/16/20    Authorization Type BCBS    PT Start Time 0802    PT Stop Time 0850    PT Time Calculation (min) 48 min    Activity Tolerance Patient tolerated treatment well    Behavior During Therapy [facility] for tasks assessed/performed           Past Medical History:  Diagnosis Date  . Allergy   . Anemia   . Back pain   . Bilateral swelling of feet   . Blood transfusion without reported diagnosis 13 years ago   . Chronic kidney disease   . Dermatitis   . Hypertension   . Joint pain   . Kidney transplanted   . Vitamin D deficiency     Past Surgical History:  Procedure Laterality Date  . COLONOSCOPY  09/06/2012   at [place]    . INSERTION OF DIALYSIS CATHETER    . KIDNEY TRANSPLANT  2006  . KNEE ARTHROPLASTY Bilateral   . NEPHRECTOMY Bilateral     There were no vitals filed for this visit.   Subjective Assessment - 06/30/20 0811    Subjective My right knee seems to be bothering me more than my left hamstring.    Currently in Pain? Yes    Pain Score 1     Pain Location Knee    Pain Orientation Right    Pain Descriptors / Indicators Sharp    Pain Type Chronic pain    Pain Frequency Intermittent                             [facility] Adult PT Treatment/Exercise - 06/30/20 0001      Knee/Hip Exercises: Aerobic   Recumbent Bike L3.0 x 18mn      Knee/Hip Exercises: Machines for Strengthening   Cybex Knee Extension 15lb 2x15,  LLE 5lb x10    Cybex Knee Flexion 35lb 2x15    Cybex Leg Press 70lb 3x12       Knee/Hip Exercises: Standing   Heel Raises 2 sets;Both;15 reps;2 seconds    Lateral Step Up Both;1 set;10 reps;Hand Hold: 0;Step Height: 8"    Forward Step Up Both;1 set;10 reps;Hand Hold: 0;Step Height: 8"    Step Down Both;1 set;10 reps;Hand Hold: 1;Step Height: 4"    Walking with Sports Cord 40lb side step over 1/2 foam roll x 10 each    Gait Training Ambulation outside around parking lot with negotiating unlevel surfaces and curbs, no loss of balance.                    PT Short Term Goals - 06/11/20 0844      PT SHORT TERM GOAL #1   Title Pt will be independent and compliant with initial HEP.    Status Achieved             PT Long Term Goals - 06/18/20 0952      PT LONG TERM GOAL #1  Title Pt will be independent with long term HEP for continued strengthening and to prevent recurrence of hamstring injury.    Status Achieved      PT LONG TERM GOAL #2   Title Pt will increase L knee MMT to 5/5 in multiple ROM for improved knee stability.    Status On-going      PT LONG TERM GOAL #3   Title Pt will report ability to ambulate on multiple surfaces of level/unlevel terrain with no feeling of instability or knee hyperextension.    Status Partially Met      PT LONG TERM GOAL #4   Title Pt will demonstrate proper deadlift form x 10 reps with 0-25#, good form and no pain for improved hamstring length and strength.    Status On-going                 Plan - 06/30/20 0856    Clinical Impression Statement Patient is having increased difficulty with his RLE compared to LLE.  He reports that he only felt a twing of discomfort with his left hamstring during sports cord lateral step over half bolster.  He continues to have difficulty with Cybex knee extension, reporting that he is performing most of the work with his LLE.  He did not have any difficulty with ambulating outside over various surfaces and maintained fast cadence.    Personal Factors and  Comorbidities Age;Past/Current Experience;Comorbidity 2;Profession    Comorbidities HTN, kidney disease    Examination-Activity Limitations Squat;Locomotion Level;Bend    Examination-Participation Restrictions Interpersonal Relationship;Occupation;Community Activity    PT Treatment/Interventions Dry needling;Cryotherapy;ADLs/Self Care Home Management;Electrical Stimulation;Iontophoresis 20m/ml Dexamethasone;Moist Heat;Gait training;Stair training;Functional mobility training;Therapeutic activities;Therapeutic exercise;Balance training;Neuromuscular re-education;Patient/family education;Manual techniques;Passive range of motion;Vasopneumatic Device;Joint Manipulations    PT Next Visit Plan hamstring flexibility, progress gentle strengthening, could benefit from DN trial at some point    Consulted and Agree with Plan of Care Patient           Patient will benefit from skilled therapeutic intervention in order to improve the following deficits and impairments:  Decreased balance,Decreased endurance,Difficulty walking,Increased edema,Impaired perceived functional ability,Improper body mechanics,Decreased activity tolerance,Decreased strength,Increased fascial restricitons,Impaired flexibility,Pain  Visit Diagnosis: Localized edema  Pain in left leg  Difficulty in walking, not elsewhere classified  Muscle weakness (generalized)     Problem List Patient Active Problem List   Diagnosis Date Noted  . Insulin resistance 06/29/2020  . Class 1 obesity with serious comorbidity and body mass index (BMI) of 33.0 to 33.9 in adult 06/25/2020  . Pain and swelling of right knee 06/04/2020  . Partial tear of left hamstring 03/30/2020  . Vitamin D deficiency 09/25/2019  . Polycystic kidney disease 010/01/16 . Essential hypertension 001-Oct-2016 . Deceased-donor kidney transplant recipient 02016/10/01 . Gout 02/02/2011    SJuel Burrow PT, DPT 06/30/2020, 9:03 AM  [place] [address]Phone: [phone]  Fax:  [phone] Name: Seth PEOPLEMRN: [id number] of Birth: 327-Jul-1962

## 2020-07-02 ENCOUNTER — Ambulatory Visit: Payer: BC Managed Care – PPO | Admitting: Rehabilitative and Restorative Service Providers"

## 2020-07-02 ENCOUNTER — Other Ambulatory Visit: Payer: Self-pay

## 2020-07-02 ENCOUNTER — Encounter: Payer: Self-pay | Admitting: Rehabilitative and Restorative Service Providers"

## 2020-07-02 DIAGNOSIS — M6281 Muscle weakness (generalized): Secondary | ICD-10-CM

## 2020-07-02 DIAGNOSIS — R600 Localized edema: Secondary | ICD-10-CM

## 2020-07-02 DIAGNOSIS — R262 Difficulty in walking, not elsewhere classified: Secondary | ICD-10-CM | POA: Diagnosis not present

## 2020-07-02 DIAGNOSIS — M79605 Pain in left leg: Secondary | ICD-10-CM

## 2020-07-02 NOTE — Therapy (Signed)
[place]. [place], [place], [address] Phone: [phone]   Fax:  [phone]  Physical Therapy Treatment  Patient Details  Name: Seth Morales MRN: [id number] Date of Birth: 02/10/1961 Referring Provider (PT): Seth Morales   Encounter Date: 07/02/2020   PT End of Session - 07/02/20 0848    Visit Number 6    Number of Visits 12    Date for PT Re-Evaluation 07/16/20    Authorization Type BCBS    PT Start Time 0801    PT Stop Time 0846    PT Time Calculation (min) 45 min    Activity Tolerance Patient tolerated treatment well    Behavior During Therapy [facility] for tasks assessed/performed           Past Medical History:  Diagnosis Date  . Allergy   . Anemia   . Back pain   . Bilateral swelling of feet   . Blood transfusion without reported diagnosis 13 years ago   . Chronic kidney disease   . Dermatitis   . Hypertension   . Joint pain   . Kidney transplanted   . Vitamin D deficiency     Past Surgical History:  Procedure Laterality Date  . COLONOSCOPY  09/06/2012   at [place]    . INSERTION OF DIALYSIS CATHETER    . KIDNEY TRANSPLANT  2006  . KNEE ARTHROPLASTY Bilateral   . NEPHRECTOMY Bilateral     There were no vitals filed for this visit.   Subjective Assessment - 07/02/20 0806    Subjective Seth am doing pretty good, no new complaints.    Currently in Pain? Yes    Pain Score 1     Pain Location Knee    Pain Orientation Right    Pain Descriptors / Indicators Sharp    Pain Type Chronic pain                             [facility] Adult PT Treatment/Exercise - 07/02/20 0001      Transfers   Five time sit to stand comments  10.94 sec      Knee/Hip Exercises: Aerobic   Recumbent Bike L4.1 x7 min      Knee/Hip Exercises: Machines for Strengthening   Cybex Knee Extension 15lb 2x15,  LLE 5lb x10    Cybex Knee Flexion 35lb 2x15    Cybex Leg Press  70lb 3x12      Knee/Hip Exercises: Standing   Heel Raises 2 sets;Both;15 reps;2 seconds    Lateral Step Up Both;1 set;10 reps;Hand Hold: 0;Step Height: 8"    Forward Step Up Both;1 set;10 reps;Hand Hold: 0;Step Height: 8"    Step Down Both;1 set;10 reps;Step Height: 4"    Walking with Sports Cord 40lb side step over 1/2 foam roll x 10 each      Knee/Hip Exercises: Seated   Long Arc Quad Right;10 reps;Weights;2 sets    [employer] Morales Weight 2 lbs.                    PT Short Term Goals - 06/11/20 0844      PT SHORT TERM GOAL #1   Title Pt will be independent and compliant with initial HEP.    Status Achieved             PT Long Term Goals - 07/02/20 6606  PT LONG TERM GOAL #1   Title Pt will be independent with long term HEP for continued strengthening and to prevent recurrence of hamstring injury.    Status Achieved      PT LONG TERM GOAL #2   Title Pt will increase L knee MMT to 5/5 in multiple ROM for improved knee stability.    Status On-going      PT LONG TERM GOAL #3   Title Pt will report ability to ambulate on multiple surfaces of level/unlevel terrain with no feeling of instability or knee hyperextension.    Baseline continues to have difficulty with inclines/declines    Status Partially Met      PT LONG TERM GOAL #4   Title Pt will demonstrate proper deadlift form x 10 reps with 0-25#, good form and no pain for improved hamstring length and strength.    Status On-going      PT LONG TERM GOAL #5   Title Pt will improve FOTO ability to 78%, in order to demonstrate significant improvement in perceived functional level of ability.    Status On-going                 Plan - 07/02/20 0850    Clinical Impression Statement Patient continues to progress with goal related activities.  Was able to assess 5 times sit to/from stand today for 10.9 seconds without use of UE.  Pt continues to have increased weakness of his RLE and requires cuing and  encouragement during step activities with RLE, with most difficulty with step down.  Pt was able to perform higher level on recumbent bike today.  He continues to require skilled PT to progress towards increased strength and ability to return to prior leisure activities.    Personal Factors and Comorbidities Age;Past/Current Experience;Comorbidity 2;Profession    Comorbidities HTN, kidney disease    Examination-Activity Limitations Squat;Locomotion Level;Bend    Examination-Participation Restrictions Interpersonal Relationship;Occupation;Community Activity    PT Treatment/Interventions Dry needling;Cryotherapy;ADLs/Self Care Home Management;Electrical Stimulation;Iontophoresis 27m/ml Dexamethasone;Moist Heat;Gait training;Stair training;Functional mobility training;Therapeutic activities;Therapeutic exercise;Balance training;Neuromuscular re-education;Patient/family education;Manual techniques;Passive range of motion;Vasopneumatic Device;Joint Manipulations    PT Next Visit Plan hamstring flexibility, progress gentle strengthening, could benefit from DN trial at some point    Consulted and Agree with Plan of Care Patient           Patient will benefit from skilled therapeutic intervention in order to improve the following deficits and impairments:  Decreased balance,Decreased endurance,Difficulty walking,Increased edema,Impaired perceived functional ability,Improper body mechanics,Decreased activity tolerance,Decreased strength,Increased fascial restricitons,Impaired flexibility,Pain  Visit Diagnosis: Muscle weakness (generalized)  Difficulty in walking, not elsewhere classified  Pain in left leg  Localized edema     Problem List Patient Active Problem List   Diagnosis Date Noted  . Insulin resistance 06/29/2020  . Class 1 obesity with serious comorbidity and body mass index (BMI) of 33.0 to 33.9 in adult 06/25/2020  . Pain and swelling of right knee 06/04/2020  . Partial tear of  left hamstring 03/30/2020  . Vitamin D deficiency 09/25/2019  . Polycystic kidney disease 009/11/2014 . Essential hypertension 0Sep 09, 2016 . Deceased-donor kidney transplant recipient 009/09/16 . Gout 02/02/2011    Seth Morales PT, DPT 07/02/2020, 8:57 AM  [place] [address]Phone: [phone]  Fax:  [phone] Name: Seth Morales: [id number] of Birth: 304/29/1962

## 2020-07-06 ENCOUNTER — Ambulatory Visit: Payer: BC Managed Care – PPO | Attending: Family Medicine | Admitting: Rehabilitative and Restorative Service Providers"

## 2020-07-06 ENCOUNTER — Other Ambulatory Visit: Payer: Self-pay

## 2020-07-06 ENCOUNTER — Encounter: Payer: Self-pay | Admitting: Rehabilitative and Restorative Service Providers"

## 2020-07-06 DIAGNOSIS — R600 Localized edema: Secondary | ICD-10-CM | POA: Diagnosis present

## 2020-07-06 DIAGNOSIS — M6281 Muscle weakness (generalized): Secondary | ICD-10-CM

## 2020-07-06 DIAGNOSIS — M79605 Pain in left leg: Secondary | ICD-10-CM | POA: Diagnosis present

## 2020-07-06 DIAGNOSIS — R262 Difficulty in walking, not elsewhere classified: Secondary | ICD-10-CM | POA: Diagnosis present

## 2020-07-06 NOTE — Therapy (Signed)
[place]. [place], [place], [address] Phone: [phone]   Fax:  [phone]  Physical Therapy Treatment  Patient Details  Name: Seth Morales MRN: [id number] Date of Birth: November 13, 1960 Referring Provider (PT): Hulan Saas, DO   Encounter Date: 07/06/2020   PT End of Session - 07/06/20 0830    Visit Number 7    Number of Visits 12    Date for PT Re-Evaluation 07/16/20    Authorization Type BCBS    PT Start Time 0803    PT Stop Time 0844    PT Time Calculation (min) 41 min    Activity Tolerance Patient tolerated treatment well    Behavior During Therapy [facility] for tasks assessed/performed           Past Medical History:  Diagnosis Date  . Allergy   . Anemia   . Back pain   . Bilateral swelling of feet   . Blood transfusion without reported diagnosis 13 years ago   . Chronic kidney disease   . Dermatitis   . Hypertension   . Joint pain   . Kidney transplanted   . Vitamin D deficiency     Past Surgical History:  Procedure Laterality Date  . COLONOSCOPY  09/06/2012   at [place]    . INSERTION OF DIALYSIS CATHETER    . KIDNEY TRANSPLANT  2006  . KNEE ARTHROPLASTY Bilateral   . NEPHRECTOMY Bilateral     There were no vitals filed for this visit.   Subjective Assessment - 07/06/20 0814    Subjective I am doing okay today.    Pertinent History Prior history of hamstring tears B LE, kidney transplant (other 2 were taken out 2 years later), B knee surgeries    Currently in Pain? No/denies                             [facility] Adult PT Treatment/Exercise - 07/06/20 0001      Knee/Hip Exercises: Aerobic   Recumbent Bike L3.5 x6 min      Knee/Hip Exercises: Machines for Strengthening   Cybex Knee Extension 20lb 2x12,  LLE 10lb x10    Cybex Knee Flexion 45lb 2x12    Cybex Leg Press 80lb 3x12, unilateral 20 lbs x10 reps (each leg)      Knee/Hip Exercises:  Standing   Heel Raises 2 sets;Both;15 reps;2 seconds    Lateral Step Up Both;1 set;10 reps;Hand Hold: 0;Step Height: 8"    Forward Step Up Both;1 set;10 reps;Hand Hold: 0;Step Height: 8"    Step Down Both;1 set;10 reps;Step Height: 4"    Walking with Sports Cord 40lb side step over 1/2 foam roll x 10 each      Knee/Hip Exercises: Seated   Long Arc Quad Right;10 reps;Weights;2 sets    [employer] Weight 3 lbs.   3                   PT Short Term Goals - 06/11/20 0844      PT SHORT TERM GOAL #1   Title Pt will be independent and compliant with initial HEP.    Status Achieved             PT Long Term Goals - 07/02/20 0854      PT LONG TERM GOAL #1   Title Pt will be independent with long term HEP  for continued strengthening and to prevent recurrence of hamstring injury.    Status Achieved      PT LONG TERM GOAL #2   Title Pt will increase L knee MMT to 5/5 in multiple ROM for improved knee stability.    Status On-going      PT LONG TERM GOAL #3   Title Pt will report ability to ambulate on multiple surfaces of level/unlevel terrain with no feeling of instability or knee hyperextension.    Baseline continues to have difficulty with inclines/declines    Status Partially Met      PT LONG TERM GOAL #4   Title Pt will demonstrate proper deadlift form x 10 reps with 0-25#, good form and no pain for improved hamstring length and strength.    Status On-going      PT LONG TERM GOAL #5   Title Pt will improve FOTO ability to 78%, in order to demonstrate significant improvement in perceived functional level of ability.    Status On-going                 Plan - 07/06/20 0845    Clinical Impression Statement Patient was able to tolerate increase of weights today without increased pain.  Pt was able to tolerate unilateral leg press on each side at 20 pounds and was able to complete on his R LE without pain.  He will follow up with Sports Medicine MD on Thursday  this week, and he is hoping to get some answers about his R knee popping and weakness, but feels that his L hamstring is nearing baseline.    Personal Factors and Comorbidities Age;Past/Current Experience;Comorbidity 2;Profession    Comorbidities HTN, kidney disease    Examination-Activity Limitations Squat;Locomotion Level;Bend    PT Treatment/Interventions Dry needling;Cryotherapy;ADLs/Self Care Home Management;Electrical Stimulation;Iontophoresis 52m/ml Dexamethasone;Moist Heat;Gait training;Stair training;Functional mobility training;Therapeutic activities;Therapeutic exercise;Balance training;Neuromuscular re-education;Patient/family education;Manual techniques;Passive range of motion;Vasopneumatic Device;Joint Manipulations    PT Next Visit Plan hamstring flexibility, progress gentle strengthening, balance    Consulted and Agree with Plan of Care Patient           Patient will benefit from skilled therapeutic intervention in order to improve the following deficits and impairments:  Decreased balance,Decreased endurance,Difficulty walking,Increased edema,Impaired perceived functional ability,Improper body mechanics,Decreased activity tolerance,Decreased strength,Increased fascial restricitons,Impaired flexibility,Pain  Visit Diagnosis: Muscle weakness (generalized)  Difficulty in walking, not elsewhere classified  Pain in left leg  Localized edema     Problem List Patient Active Problem List   Diagnosis Date Noted  . Insulin resistance 06/29/2020  . Class 1 obesity with serious comorbidity and body mass index (BMI) of 33.0 to 33.9 in adult 06/25/2020  . Pain and swelling of right knee 06/04/2020  . Partial tear of left hamstring 03/30/2020  . Vitamin D deficiency 09/25/2019  . Polycystic kidney disease 009-27-16 . Essential hypertension 02016/09/27 . Deceased-donor kidney transplant recipient 02016/09/27 . Gout 02/02/2011    SJuel Burrow PT, DPT 07/06/2020, 8:50  AM  [place] [address]Phone: [phone]  Fax:  [phone] Name: RZAVIEN CLUBBMRN: [id number] of Birth: [birth year]

## 2020-07-08 ENCOUNTER — Encounter: Payer: Self-pay | Admitting: Rehabilitative and Restorative Service Providers"

## 2020-07-08 ENCOUNTER — Ambulatory Visit: Payer: BC Managed Care – PPO | Admitting: Rehabilitative and Restorative Service Providers"

## 2020-07-08 ENCOUNTER — Other Ambulatory Visit: Payer: Self-pay

## 2020-07-08 DIAGNOSIS — R600 Localized edema: Secondary | ICD-10-CM

## 2020-07-08 DIAGNOSIS — M6281 Muscle weakness (generalized): Secondary | ICD-10-CM | POA: Diagnosis not present

## 2020-07-08 NOTE — Therapy (Signed)
[place]. [place], [place], [address] Phone: [phone]   Fax:  [phone]  Physical Therapy Treatment & Discharge Summary  Patient Details  Name: Seth Morales MRN: [id number] Date of Birth: Aug 04, 1960 Referring Provider (PT): Hulan Saas, DO   Encounter Date: 07/08/2020   PT End of Session - 07/08/20 0845    Visit Number 8    Number of Visits 12    Date for PT Re-Evaluation 07/16/20    Authorization Type BCBS    PT Start Time 0803    PT Stop Time 0845    PT Time Calculation (min) 42 min    Activity Tolerance Patient tolerated treatment well    Behavior During Therapy [facility] for tasks assessed/performed           Past Medical History:  Diagnosis Date  . Allergy   . Anemia   . Back pain   . Bilateral swelling of feet   . Blood transfusion without reported diagnosis 13 years ago   . Chronic kidney disease   . Dermatitis   . Hypertension   . Joint pain   . Kidney transplanted   . Vitamin D deficiency     Past Surgical History:  Procedure Laterality Date  . COLONOSCOPY  09/06/2012   at [place]    . INSERTION OF DIALYSIS CATHETER    . KIDNEY TRANSPLANT  2006  . KNEE ARTHROPLASTY Bilateral   . NEPHRECTOMY Bilateral     There were no vitals filed for this visit.       [facility] PT Assessment - 07/08/20 0001      Observation/Other Assessments   Focus on Therapeutic Outcomes (FOTO)  67% ability                         [facility] Adult PT Treatment/Exercise - 07/08/20 0001      Ambulation/Gait   Ambulation/Gait Yes    Ambulation/Gait Assistance 7: Independent    Ambulation Distance (Feet) 800 Feet    Gait Pattern Within Functional Limits    Ambulation Surface Unlevel;Outdoor;Paved      Knee/Hip Exercises: Aerobic   Recumbent Bike L4.0 x62mn      Knee/Hip Exercises: Machines for Strengthening   Cybex Knee Extension 20lb 2x12,  LLE 10lb x10    Cybex Knee  Flexion 45lb 2x12    Cybex Leg Press 80lb 3x12, unilateral 20 lbs x10 reps (each leg)      Knee/Hip Exercises: Standing   Heel Raises 2 sets;Both;15 reps;2 seconds                    PT Short Term Goals - 06/11/20 0844      PT SHORT TERM GOAL #1   Title Pt will be independent and compliant with initial HEP.    Status Achieved             PT Long Term Goals - 07/08/20 0836      PT LONG TERM GOAL #2   Title Pt will increase L knee MMT to 5/5 in multiple ROM for improved knee stability.    Status Achieved      PT LONG TERM GOAL #3   Title Pt will report ability to ambulate on multiple surfaces of level/unlevel terrain with no feeling of instability or knee hyperextension.    Baseline No hyperextension noted with good gait pattern.    Status Achieved  PT LONG TERM GOAL #4   Title Pt will demonstrate proper deadlift form x 10 reps with 0-25#, good form and no pain for improved hamstring length and strength.    Status Achieved      PT LONG TERM GOAL #5   Title Pt will improve FOTO ability to 78%, in order to demonstrate significant improvement in perceived functional level of ability.    Baseline 67% ability    Status Partially Met                 Plan - 07/08/20 0846    Clinical Impression Statement Seth Morales has met all goals, except the FOTO score, which he has improved.  He is able to demonstrate good gait pattern and good form with deadlifts.  He reports that he goes to see his MD tomorrow for a follow up, and hopes that they will have more information for potential options for his R knee and edema pocket.  He reports that his L hamstring is much better and only occasionally bothers him, and when it happens, it is very brief.  Pt is ready for discharge from PT at this time to continue with HEP and educated on the benefits of rejoining a gym.    PT Treatment/Interventions Dry needling;Cryotherapy;ADLs/Self Care Home Management;Electrical  Stimulation;Iontophoresis 55m/ml Dexamethasone;Moist Heat;Gait training;Stair training;Functional mobility training;Therapeutic activities;Therapeutic exercise;Balance training;Neuromuscular re-education;Patient/family education;Manual techniques;Passive range of motion;Vasopneumatic Device;Joint Manipulations    PT Next Visit Plan Discharge visit performed today, pt has met all goals except FOTO score.    Recommended Other Services recommend HEP and rejoining a gym    Consulted and Agree with Plan of Care Patient           Patient will benefit from skilled therapeutic intervention in order to improve the following deficits and impairments:  Decreased balance,Decreased endurance,Difficulty walking,Increased edema,Impaired perceived functional ability,Improper body mechanics,Decreased activity tolerance,Decreased strength,Increased fascial restricitons,Impaired flexibility,Pain  PHYSICAL THERAPY DISCHARGE SUMMARY  Plan: Patient agrees to discharge.  Patient goals were met. Patient is being discharged due to meeting the stated rehab goals.  ?????         Visit Diagnosis: Muscle weakness (generalized)  Localized edema     Problem List Patient Active Problem List   Diagnosis Date Noted  . Insulin resistance 06/29/2020  . Class 1 obesity with serious comorbidity and body mass index (BMI) of 33.0 to 33.9 in adult 06/25/2020  . Pain and swelling of right knee 06/04/2020  . Partial tear of left hamstring 03/30/2020  . Vitamin D deficiency 09/25/2019  . Polycystic kidney disease 02016/09/20 . Essential hypertension 0September 20, 2016 . Deceased-donor kidney transplant recipient 009/20/2016 . Gout 02/02/2011    SJuel Burrow PT, DPT 07/08/2020, 8:57 AM  [place] [address]Phone: [phone]  Fax:  [phone] Name: Seth FERGERMRN: [id number] of Birth: 323-Jul-1962

## 2020-07-08 NOTE — Therapy (Signed)
[place]. [place], [place], [address] Phone: [phone]   Fax:  [phone]  Physical Therapy Treatment & Discharge Summary  Patient Details  Name: Seth Morales MRN: [id number] Date of Birth: 23-Feb-1961 Referring Provider (PT): Hulan Saas, DO   Encounter Date: 07/08/2020   PT End of Session - 07/08/20 0845    Visit Number 8    Number of Visits 12    Date for PT Re-Evaluation 07/16/20    Authorization Type BCBS    PT Start Time 0803    PT Stop Time 0845    PT Time Calculation (min) 42 min    Activity Tolerance Patient tolerated treatment well    Behavior During Therapy [facility] for tasks assessed/performed           Past Medical History:  Diagnosis Date  . Allergy   . Anemia   . Back pain   . Bilateral swelling of feet   . Blood transfusion without reported diagnosis 13 years ago   . Chronic kidney disease   . Dermatitis   . Hypertension   . Joint pain   . Kidney transplanted   . Vitamin D deficiency     Past Surgical History:  Procedure Laterality Date  . COLONOSCOPY  09/06/2012   at [place]    . INSERTION OF DIALYSIS CATHETER    . KIDNEY TRANSPLANT  2006  . KNEE ARTHROPLASTY Bilateral   . NEPHRECTOMY Bilateral     There were no vitals filed for this visit.   Subjective Assessment - 07/08/20 1111    Subjective I am doing well.  I go see my Sports Medicine MD tomorrow.  I think I am pretty much where I am going to be right now.  My left hamstring feels much better.    Currently in Pain? No/denies              [facility] PT Assessment - 07/08/20 0001      Observation/Other Assessments   Focus on Therapeutic Outcomes (FOTO)  67% ability                         [facility] Adult PT Treatment/Exercise - 07/08/20 0001      Ambulation/Gait   Ambulation/Gait Yes    Ambulation/Gait Assistance 7: Independent    Ambulation Distance (Feet) 800 Feet    Gait  Pattern Within Functional Limits    Ambulation Surface Unlevel;Outdoor;Paved      Knee/Hip Exercises: Aerobic   Recumbent Bike L4.0 x27mn      Knee/Hip Exercises: Machines for Strengthening   Cybex Knee Extension 20lb 2x12,  LLE 10lb x10    Cybex Knee Flexion 45lb 2x12    Cybex Leg Press 80lb 3x12, unilateral 20 lbs x10 reps (each leg)      Knee/Hip Exercises: Standing   Heel Raises 2 sets;Both;15 reps;2 seconds                    PT Short Term Goals - 06/11/20 0844      PT SHORT TERM GOAL #1   Title Pt will be independent and compliant with initial HEP.    Status Achieved             PT Long Term Goals - 07/08/20 0836      PT LONG TERM GOAL #2   Title Pt will increase L knee MMT to 5/5 in multiple  ROM for improved knee stability.    Status Achieved      PT LONG TERM GOAL #3   Title Pt will report ability to ambulate on multiple surfaces of level/unlevel terrain with no feeling of instability or knee hyperextension.    Baseline No hyperextension noted with good gait pattern.    Status Achieved      PT LONG TERM GOAL #4   Title Pt will demonstrate proper deadlift form x 10 reps with 0-25#, good form and no pain for improved hamstring length and strength.    Status Achieved      PT LONG TERM GOAL #5   Title Pt will improve FOTO ability to 78%, in order to demonstrate significant improvement in perceived functional level of ability.    Baseline 67% ability    Status Partially Met                 Plan - 07/08/20 0846    Clinical Impression Statement Seth Morales has met all goals, except the FOTO score, which he has improved.  He is able to demonstrate good gait pattern and good form with deadlifts.  He reports that he goes to see his MD tomorrow for a follow up, and hopes that they will have more information for potential options for his R knee and edema pocket.  He reports that his L hamstring is much better and only occasionally bothers him, and when it  happens, it is very brief.  Pt is ready for discharge from PT at this time to continue with HEP and educated on the benefits of rejoining a gym.    PT Treatment/Interventions Dry needling;Cryotherapy;ADLs/Self Care Home Management;Electrical Stimulation;Iontophoresis 23m/ml Dexamethasone;Moist Heat;Gait training;Stair training;Functional mobility training;Therapeutic activities;Therapeutic exercise;Balance training;Neuromuscular re-education;Patient/family education;Manual techniques;Passive range of motion;Vasopneumatic Device;Joint Manipulations    PT Next Visit Plan Discharge visit performed today, pt has met all goals except FOTO score.    Recommended Other Services recommend HEP and rejoining a gym    Consulted and Agree with Plan of Care Patient           Patient will benefit from skilled therapeutic intervention in order to improve the following deficits and impairments:  Decreased balance,Decreased endurance,Difficulty walking,Increased edema,Impaired perceived functional ability,Improper body mechanics,Decreased activity tolerance,Decreased strength,Increased fascial restricitons,Impaired flexibility,Pain  Visit Diagnosis: Muscle weakness (generalized)  Localized edema     Problem List Patient Active Problem List   Diagnosis Date Noted  . Insulin resistance 06/29/2020  . Class 1 obesity with serious comorbidity and body mass index (BMI) of 33.0 to 33.9 in adult 06/25/2020  . Pain and swelling of right knee 06/04/2020  . Partial tear of left hamstring 03/30/2020  . Vitamin D deficiency 09/25/2019  . Polycystic kidney disease 0Oct 02, 2016 . Essential hypertension 010-04-2014 . Deceased-donor kidney transplant recipient 02016/10/02 . Gout 02/02/2011    PHYSICAL THERAPY DISCHARGE SUMMARY   Plan: Patient agrees to discharge.  Patient goals were met. Patient is being discharged due to meeting the stated rehab goals.  ?????        Seth Morales PT, DPT 07/08/2020, 11:26  AM  [place] [address]Phone: [phone]  Fax:  [phone] Name: Seth CARIASMRN: [id number] of Birth: [birth year]

## 2020-07-08 NOTE — Progress Notes (Deleted)
[name] [address] [place] [address] Phone: [phone] Subjective:    I'm seeing this patient by the request  of:  [name], DO  CC:   JOA:[id number]   06/04/2020 Patient on the ultrasound shows the patient does have an effusion noted of the knee of the patellofemoral joint.  Patient also has since synovitis noted.  Mild Baker's cyst noted.  Patient also in the area of the most pain may have a very small irregularity of the bone that could be consistent with a small fracture but seems to be healing with callus formation.  Discussed with patient about icing regimen, home exercises, which activities to do which wants to avoid.  At follow-up we will consider the possibility of injection as well as getting x-rays.  Unfortunately unable to do that today secondary to difficulties with the x-rays.  Patient has a very large tear of the hamstring and doing well.  Will be starting with formal physical therapy significantly next week.  Would like him to do that fairly regularly for the next 4 to 6 weeks.  Patient will follow up at the end of that time and we will reultrasound and see how patient is doing.  Update 07/09/2020 GIANCARLOS BERENDT is a 60 y.o. male coming in with complaint of R knee and L hamstring tear. Patient has been doing physical therapy. Patient states   Onset-  Location Duration-  Character- Aggravating factors- Reliving factors-  Therapies tried-  Severity-     Past Medical History:  Diagnosis Date  . Allergy   . Anemia   . Back pain   . Bilateral swelling of feet   . Blood transfusion without reported diagnosis 13 years ago   . Chronic kidney disease   . Dermatitis   . Hypertension   . Joint pain   . Kidney transplanted   . Vitamin D deficiency    Past Surgical History:  Procedure Laterality Date  . COLONOSCOPY  09/06/2012   at [place]  . EYE SURGERY    . INSERTION OF DIALYSIS CATHETER    . KIDNEY  TRANSPLANT  2006  . KNEE ARTHROPLASTY Bilateral   . NEPHRECTOMY Bilateral    Social History   Socioeconomic History  . Marital status: Married    Spouse name: Not on file  . Number of children: Not on file  . Years of education: Not on file  . Highest education level: Not on file  Occupational History  . Not on file  Tobacco Use  . Smoking status: Never Smoker  . Smokeless tobacco: Never Used  Vaping Use  . Vaping Use: Never used  Substance and Sexual Activity  . Alcohol use: Yes    Alcohol/week: 2.0 standard drinks    Types: 2 Standard drinks or equivalent per week  . Drug use: No  . Sexual activity: Not on file  Other Topics Concern  . Not on file  Social History Narrative  . Not on file   Social Determinants of Health   Financial Resource Strain: Not on file  Food Insecurity: Not on file  Transportation Needs: Not on file  Physical Activity: Not on file  Stress: Not on file  Social Connections: Not on file   No Known Allergies Family History  Problem Relation Age of Onset  . Arthritis Mother   . Hypertension Mother   . Kidney disease Mother   . Diabetes Mother   . Diabetes Father   .  Colon cancer Neg Hx   . Colon polyps Neg Hx   . Esophageal cancer Neg Hx   . Rectal cancer Neg Hx   . Stomach cancer Neg Hx     Current Outpatient Medications (Endocrine & Metabolic):  .  predniSONE (DELTASONE) 5 MG tablet, Take 1 tablet (5 mg total) by mouth daily with breakfast.  Current Outpatient Medications (Cardiovascular):  .  hydrochlorothiazide (MICROZIDE) 12.5 MG capsule, Take 12.5 mg by mouth every other day.   Current Outpatient Medications (Analgesics):  .  allopurinol (ZYLOPRIM) 100 MG tablet, Take 50 mg by mouth daily.    Current Outpatient Medications (Other):  Marland Kitchen  Cholecalciferol (VITAMIN D) 125 MCG (5000 UT) CAPS, Take 5,000 Units by mouth daily.  .  cyclobenzaprine (FLEXERIL) 5 MG tablet, Take 1 tablet (5 mg total) by mouth at bedtime. .   cycloSPORINE modified (NEORAL) 100 MG capsule, Take 100 mg by mouth 2 (two) times daily. .  cycloSPORINE modified (NEORAL) 25 MG capsule,  .  magnesium oxide-pyridoxine (BEELITH) 362-20 MG TABS, Take 1 tablet by mouth daily. .  Multiple Vitamin (MULTI-VITAMIN) tablet, Take by mouth. .  mycophenolate (CELLCEPT) 250 MG capsule, Take 750 mg by mouth 2 (two) times daily. .  Omega-3 Fatty Acids (FISH OIL PO), Take by mouth. .  selenium sulfide (SELSUN) 2.5 % shampoo, Apply topically as directed. .  Sulfacetamide Sodium-Sulfur 8-4 % SUSP,    Reviewed prior external information including notes and imaging from  primary care provider As well as notes that were available from care everywhere and other healthcare systems.  Past medical history, social, surgical and family history all reviewed in electronic medical record.  No pertanent information unless stated regarding to the chief complaint.   Review of Systems:  No headache, visual changes, nausea, vomiting, diarrhea, constipation, dizziness, abdominal pain, skin rash, fevers, chills, night sweats, weight loss, swollen lymph nodes, body aches, joint swelling, chest pain, shortness of breath, mood changes. POSITIVE muscle aches  Objective  There were no vitals taken for this visit.   General: No apparent distress alert and oriented x3 mood and affect normal, dressed appropriately.  HEENT: Pupils equal, extraocular movements intact  Respiratory: Patient's speak in full sentences and does not appear short of breath  Cardiovascular: No lower extremity edema, non tender, no erythema  Gait normal with good balance and coordination.  MSK:  Non tender with full range of motion and good stability and symmetric strength and tone of shoulders, elbows, wrist, hip, knee and ankles bilaterally.     Impression and Recommendations:     The above documentation has been reviewed and is accurate and complete [name]

## 2020-07-09 ENCOUNTER — Ambulatory Visit: Payer: BC Managed Care – PPO | Admitting: Family Medicine

## 2020-07-14 NOTE — Progress Notes (Signed)
[facility] [address] [place] [address] Phone: [phone] Subjective:   I Seth Morales am serving as a Neurosurgeon for Dr. Antoine Primas.  This visit occurred during the SARS-CoV-2 public health emergency.  Safety protocols were in place, including screening questions prior to the visit, additional usage of staff PPE, and extensive cleaning of exam room while observing appropriate contact time as indicated for disinfecting solutions.   I'm seeing this patient by the request  of:  [name], DO  CC: Right knee, left hamstring, right shoulder follow-up  [id number]   06/04/2020 Patient on the ultrasound shows the patient does have an effusion noted of the knee of the patellofemoral joint.  Patient also has since synovitis noted.  Mild Baker's cyst noted.  Patient also in the area of the most pain may have a very small irregularity of the bone that could be consistent with a small fracture but seems to be healing with callus formation.  Discussed with patient about icing regimen, home exercises, which activities to do which wants to avoid.  At follow-up we will consider the possibility of injection as well as getting x-rays.  Unfortunately unable to do that today secondary to difficulties with the x-rays.  Patient has a very large tear of the hamstring and doing well.  Will be starting with formal physical therapy significantly next week.  Would like him to do that fairly regularly for the next 4 to 6 weeks.  Patient will follow up at the end of that time and we will reultrasound and see how patient is doing.  Update 07/15/2020 Seth Morales is a 60 y.o. male coming in with complaint of R knee and L hamstring pain. Patient states he is doing ok. Right knee is not doing good. Pain mostly with activity. Finished PT. Pops and pain with knee extension. Hamstring feels like it has a deep bruise and is unstable sometimes. Walking can be difficult at  times.    Also having right shoulder pain.  Notices when he reaches across his body has significant amount of discomfort.  Mild radiation to the mid arm.  No significant weakness though noted.  States that it can wake him up at night.  Past Medical History:  Diagnosis Date  . Allergy   . Anemia   . Back pain   . Bilateral swelling of feet   . Blood transfusion without reported diagnosis 13 years ago   . Chronic kidney disease   . Dermatitis   . Hypertension   . Joint pain   . Kidney transplanted   . Vitamin D deficiency    Past Surgical History:  Procedure Laterality Date  . COLONOSCOPY  09/06/2012   at [place]  . EYE SURGERY    . INSERTION OF DIALYSIS CATHETER    . KIDNEY TRANSPLANT  2006  . KNEE ARTHROPLASTY Bilateral   . NEPHRECTOMY Bilateral    Social History   Socioeconomic History  . Marital status: Married    Spouse name: Not on file  . Number of children: Not on file  . Years of education: Not on file  . Highest education level: Not on file  Occupational History  . Not on file  Tobacco Use  . Smoking status: Never Smoker  . Smokeless tobacco: Never Used  Vaping Use  . Vaping Use: Never used  Substance and Sexual Activity  . Alcohol use: Yes    Alcohol/week: 2.0 standard drinks  Types: 2 Standard drinks or equivalent per week  . Drug use: No  . Sexual activity: Not on file  Other Topics Concern  . Not on file  Social History Narrative  . Not on file   Social Determinants of Health   Financial Resource Strain: Not on file  Food Insecurity: Not on file  Transportation Needs: Not on file  Physical Activity: Not on file  Stress: Not on file  Social Connections: Not on file   No Known Allergies Family History  Problem Relation Age of Onset  . Arthritis Mother   . Hypertension Mother   . Kidney disease Mother   . Diabetes Mother   . Diabetes Father   . Colon cancer Neg Hx   . Colon polyps Neg Hx   . Esophageal cancer Neg Hx   . Rectal  cancer Neg Hx   . Stomach cancer Neg Hx     Current Outpatient Medications (Endocrine & Metabolic):  .  predniSONE (DELTASONE) 5 MG tablet, Take 1 tablet (5 mg total) by mouth daily with breakfast.  Current Outpatient Medications (Cardiovascular):  .  hydrochlorothiazide (MICROZIDE) 12.5 MG capsule, Take 12.5 mg by mouth every other day.   Current Outpatient Medications (Analgesics):  .  allopurinol (ZYLOPRIM) 100 MG tablet, Take 50 mg by mouth daily.    Current Outpatient Medications (Other):  Marland Kitchen  Cholecalciferol (VITAMIN D) 125 MCG (5000 UT) CAPS, Take 5,000 Units by mouth daily.  .  cycloSPORINE modified (NEORAL) 100 MG capsule, Take 100 mg by mouth 2 (two) times daily. .  cycloSPORINE modified (NEORAL) 25 MG capsule,  .  magnesium oxide-pyridoxine (BEELITH) 362-20 MG TABS, Take 1 tablet by mouth daily. .  Multiple Vitamin (MULTI-VITAMIN) tablet, Take by mouth. .  mycophenolate (CELLCEPT) 250 MG capsule, Take 750 mg by mouth 2 (two) times daily. .  Omega-3 Fatty Acids (FISH OIL PO), Take by mouth. .  selenium sulfide (SELSUN) 2.5 % shampoo, Apply topically as directed. .  Sulfacetamide Sodium-Sulfur 8-4 % SUSP,    Reviewed prior external information including notes and imaging from  primary care provider As well as notes that were available from care everywhere and other healthcare systems.  Past medical history, social, surgical and family history all reviewed in electronic medical record.  No pertanent information unless stated regarding to the chief complaint.   Review of Systems:  No headache, visual changes, nausea, vomiting, diarrhea, constipation, dizziness, abdominal pain, skin rash, fevers, chills, night sweats, weight loss, swollen lymph nodes, body aches, joint swelling, chest pain, shortness of breath, mood changes. POSITIVE muscle aches  Objective  Blood pressure 128/90, pulse 69, height 6\' 4"  (1.93 m), weight 245 lb (111.1 kg), SpO2 100 %.   General: No apparent  distress alert and oriented x3 mood and affect normal, dressed appropriately.  HEENT: Pupils equal, extraocular movements intact  Respiratory: Patient's speak in full sentences and does not appear short of breath  Cardiovascular: No lower extremity edema, non tender, no erythema  Gait normal with good balance and coordination.  MSK:   Right shoulder exam shows the patient does have mild positive impingement noted.  Patient with and Neer's.  Positive crossover sign noted.  Rotator cuff strength is 5 out of 5 with negative empty can.  Right knee shows some mild effusion noted.  Patient does have crepitus noted.  Tender to palpation of the medial joint line.  Left hamstring shows significant improvement in strength at this time.  Patient does not have any  discoloration.  Full range of motion of the left leg.  Limited musculoskeletal ultrasound was performed and interpreted by Judi Saa  Limited ultrasound of patient's left hamstring shows the patient still has about a 3 cm defect which is significantly down from the 18 cm initially.  Significant decrease in hypoechoic changes noted.  Still has some mild atrophy of the hamstring musculature noted though. Impression: Interval improvement of the hamstring tear.  Procedure: Real-time Ultrasound Guided Injection of right acromioclavicular joint Device: GE Logiq Q7 Ultrasound guided injection is preferred based studies that show increased duration, increased effect, greater accuracy, decreased procedural pain, increased response rate, and decreased cost with ultrasound guided versus blind injection.  Verbal informed consent obtained.  Time-out conducted.  Noted no overlying erythema, induration, or other signs of local infection.  Skin prepped in a sterile fashion.  Local anesthesia: Topical Ethyl chloride.  With sterile technique and under real time ultrasound guidance: With a 25-gauge half inch needle injected with 0.5 cc of 0.5%  Marcaine and 0.5 cc of Kenalog 40 mg/mL Completed without difficulty  Pain immediately resolved suggesting accurate placement of the medication.  Advised to call if fevers/chills, erythema, induration, drainage, or persistent bleeding.  Impression: Technically successful ultrasound guided injection.  After informed written and verbal consent, patient was seated on exam table. Right knee was prepped with alcohol swab and utilizing anterolateral approach, patient's right knee space was injected with 4:1  marcaine 0.5%: Kenalog 40mg /dL. Patient tolerated the procedure well without immediate complications.    Impression and Recommendations:     The above documentation has been reviewed and is accurate and complete , DO

## 2020-07-15 ENCOUNTER — Encounter: Payer: Self-pay | Admitting: Family Medicine

## 2020-07-15 ENCOUNTER — Other Ambulatory Visit: Payer: Self-pay

## 2020-07-15 ENCOUNTER — Ambulatory Visit (INDEPENDENT_AMBULATORY_CARE_PROVIDER_SITE_OTHER): Payer: BC Managed Care – PPO

## 2020-07-15 ENCOUNTER — Ambulatory Visit (INDEPENDENT_AMBULATORY_CARE_PROVIDER_SITE_OTHER): Payer: BC Managed Care – PPO | Admitting: Family Medicine

## 2020-07-15 ENCOUNTER — Ambulatory Visit: Payer: Self-pay

## 2020-07-15 VITALS — BP 128/90 | HR 69 | Ht 76.0 in | Wt 245.0 lb

## 2020-07-15 DIAGNOSIS — S76312A Strain of muscle, fascia and tendon of the posterior muscle group at thigh level, left thigh, initial encounter: Secondary | ICD-10-CM

## 2020-07-15 DIAGNOSIS — M19019 Primary osteoarthritis, unspecified shoulder: Secondary | ICD-10-CM | POA: Insufficient documentation

## 2020-07-15 DIAGNOSIS — M25461 Effusion, right knee: Secondary | ICD-10-CM

## 2020-07-15 DIAGNOSIS — G8929 Other chronic pain: Secondary | ICD-10-CM | POA: Diagnosis not present

## 2020-07-15 DIAGNOSIS — M25511 Pain in right shoulder: Secondary | ICD-10-CM

## 2020-07-15 DIAGNOSIS — M25561 Pain in right knee: Secondary | ICD-10-CM

## 2020-07-15 DIAGNOSIS — M19011 Primary osteoarthritis, right shoulder: Secondary | ICD-10-CM

## 2020-07-15 NOTE — Assessment & Plan Note (Signed)
Patient given injection today and tolerated the procedure well.  Discussed icing regimen and home exercises, discussed avoiding certain activities.  Follow-up with me again in 6 to 8 weeks

## 2020-07-15 NOTE — Assessment & Plan Note (Signed)
Hamstring significantly improved from previous exam.  Patient will do more compression, discussed icing regimen.  Follow-up again in 6 to 8 weeks.

## 2020-07-15 NOTE — Patient Instructions (Addendum)
Good to see you Xray today Injected knee and AC joint Keep focusing hamstring and knee for now See me again in 6 weeks

## 2020-07-15 NOTE — Assessment & Plan Note (Signed)
Patient given injection today and tolerated the procedure well.  Discussed icing regimen and home exercises, which activities to doing which wants to avoid.  Patient will increase activity slowly.  X-rays are pending to further evaluate the amount of arthritic changes that could also be contributing.  If increasing instability will consider possible bracing.  Patient may be a candidate for viscosupplementation in the long run.  Follow-up with me again 6 weeks

## 2020-07-28 ENCOUNTER — Other Ambulatory Visit: Payer: Self-pay

## 2020-07-28 ENCOUNTER — Encounter (INDEPENDENT_AMBULATORY_CARE_PROVIDER_SITE_OTHER): Payer: Self-pay | Admitting: Family Medicine

## 2020-07-28 ENCOUNTER — Ambulatory Visit (INDEPENDENT_AMBULATORY_CARE_PROVIDER_SITE_OTHER): Payer: BC Managed Care – PPO | Admitting: Family Medicine

## 2020-07-28 VITALS — BP 132/83 | HR 66 | Temp 97.7°F | Ht 76.0 in | Wt 239.0 lb

## 2020-07-28 DIAGNOSIS — I10 Essential (primary) hypertension: Secondary | ICD-10-CM

## 2020-07-28 DIAGNOSIS — Z940 Kidney transplant status: Secondary | ICD-10-CM | POA: Diagnosis not present

## 2020-07-28 DIAGNOSIS — Z6833 Body mass index (BMI) 33.0-33.9, adult: Secondary | ICD-10-CM | POA: Diagnosis not present

## 2020-07-28 DIAGNOSIS — E669 Obesity, unspecified: Secondary | ICD-10-CM | POA: Diagnosis not present

## 2020-07-28 NOTE — Progress Notes (Signed)
Chief Complaint:   OBESITY Seth Morales is here to discuss his progress with his obesity treatment plan along with follow-up of his obesity related diagnoses. Seth Morales is on the Category 4 Plan and states he is following his eating plan approximately 50% of the time. Collie states he is 6 miles daily.  Today's visit was #: 17 Starting weight: 285 lbs Starting date: 05/22/2019 Today's weight: 239 lbs Today's date: 07/28/2020 Total lbs lost to date: 46 lbs Total lbs lost since last in-office visit: 0  Interim History: Seth Morales is up 7 lbs today (239 lbs). Goal weight is 228-233 lbs. He went on vacation and ate what he wanted. He has also had quite a bit of stress. (mom is in hospital and otherpersonal issues). He got back on plan yesterday. He tends to eat late at night after gigs. 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 He is able to walk more now, as his injured leg is mostly healed.  Subjective:   1. Essential hypertension Well controlled. Jeremias is taking HCTZ.   BP Readings from Last 3 Encounters:  07/28/20 132/83  07/15/20 128/90  07/07/2020 121/77   2. Deceased-donor kidney transplant recipient GFR and creatine WNL. Creatine 1.20 and GFR 70.  Assessment/Plan:   1. Essential hypertension -Continue HCTZ.  2. Deceased-donor kidney transplant recipient Continue to monitor.  3. Overweight: Current BMI 29.1 Seth Morales is currently in the action stage of change. As such, his goal is to continue with weight loss efforts. He has agreed to the Category 4 Plan.   Exercise goals: As is  Behavioral modification strategies: decreasing simple carbohydrates and ways to avoid night time snacking.  Kalee has agreed to follow-up with our clinic in 3-4 weeks.   Objective:   Blood pressure 132/83, pulse 66, temperature 97.7 F (36.5 C), height 6\' 4"  (1.93 m), weight 239 lb (108.4 kg), SpO2 99 %. Body mass index is 29.09 kg/m.  General: Cooperative, alert, well developed, in no  acute distress. HEENT: Conjunctivae and lids unremarkable. Cardiovascular: Regular rhythm.  Lungs: Normal work of breathing. Neurologic: No focal deficits.   Lab Results  Component Value Date   CREATININE 1.20 07-Jul-2020   BUN 21 07-Jul-2020   NA 143 Jul 07, 2020   K 3.9 07-07-20   CL 102 07-07-20   CO2 25 2020-07-07   Lab Results  Component Value Date   ALT 26 Jul 07, 2020   AST 23 Jul 07, 2020   ALKPHOS 70 07-07-20   BILITOT 0.8 07/07/20   Lab Results  Component Value Date   HGBA1C 5.5 07/07/20   HGBA1C 5.4 05/22/2019   HGBA1C 5.4 11/30/2018   HGBA1C 5.6 07/12/2017   HGBA1C 5.2 11/07/2014   Lab Results  Component Value Date   INSULIN 8.5 Jul 07, 2020   INSULIN 6.0 01/20/2020   INSULIN 14.6 05/22/2019   Lab Results  Component Value Date   TSH 2.970 05/22/2019   Lab Results  Component Value Date   CHOL 180 2020/07/07   HDL 51 Jul 07, 2020   LDLCALC 109 (H) 07-07-2020   LDLDIRECT 114.0 03/14/2019   TRIG 113 07/07/2020   CHOLHDL 3.4 01/20/2020   Lab Results  Component Value Date   WBC 6.4 01/20/2020   HGB 14.8 01/20/2020   HCT 45.5 01/20/2020   MCV 93 01/20/2020   PLT 177 01/20/2020   No results found for: IRON, TIBC, FERRITIN  Attestation Statements:   Reviewed by clinician on day of visit: allergies, medications, problem list, medical history, surgical history, family history, social  history, and previous encounter notes.  Edmund Hilda, CMA, am acting as Energy manager for [place], FNP.  I have reviewed the above documentation for accuracy and completeness, and I agree with the above. -  Jesse Sans, FNP

## 2020-08-10 ENCOUNTER — Encounter: Payer: Self-pay | Admitting: Family Medicine

## 2020-08-16 ENCOUNTER — Other Ambulatory Visit: Payer: Self-pay | Admitting: Family Medicine

## 2020-08-25 NOTE — Progress Notes (Deleted)
[facility] [address] [place] [address] Phone: [phone] Subjective:    I'm seeing this patient by the request  of:  [name], DO  CC:   [id number]  07/15/2020 Hamstring significantly improved from previous exam.  Patient will do more compression, discussed icing regimen.  Follow-up again in 6 to 8 weeks.  Patient given injection today and tolerated the procedure well.  Discussed icing regimen and home exercises, discussed avoiding certain activities.  Follow-up with me again in 6 to 8 weeks  Patient given injection today and tolerated the procedure well.  Discussed icing regimen and home exercises, which activities to doing which wants to avoid.  Patient will increase activity slowly.  X-rays are pending to further evaluate the amount of arthritic changes that could also be contributing.  If increasing instability will consider possible bracing.  Patient may be a candidate for viscosupplementation in the long run.  Follow-up with me again 6 weeks  Update 08/26/2020 Seth Morales is a 60 y.o. male coming in with complaint of R knee, R shoulder and L hamstring pain. Patient states   Xray R knee 07/15/2020  IMPRESSION: Tricompartmental degenerative changes, most prominent in the lateral compartment.    R shoulder xray 07/15/2020 negative     Past Medical History:  Diagnosis Date   Allergy    Anemia    Back pain    Bilateral swelling of feet    Blood transfusion without reported diagnosis 13 years ago    Chronic kidney disease    Dermatitis    Hypertension    Joint pain    Kidney transplanted    Vitamin D deficiency    Past Surgical History:  Procedure Laterality Date   COLONOSCOPY  09/06/2012   at [place]   EYE SURGERY     INSERTION OF DIALYSIS CATHETER     KIDNEY TRANSPLANT  2006   KNEE ARTHROPLASTY Bilateral    NEPHRECTOMY Bilateral    Social History   Socioeconomic History   Marital status:  Married    Spouse name: Not on file   Number of children: Not on file   Years of education: Not on file   Highest education level: Not on file  Occupational History   Not on file  Tobacco Use   Smoking status: Never   Smokeless tobacco: Never  Vaping Use   Vaping Use: Never used  Substance and Sexual Activity   Alcohol use: Yes    Alcohol/week: 2.0 standard drinks    Types: 2 Standard drinks or equivalent per week   Drug use: No   Sexual activity: Not on file  Other Topics Concern   Not on file  Social History Narrative   Not on file   Social Determinants of Health   Financial Resource Strain: Not on file  Food Insecurity: Not on file  Transportation Needs: Not on file  Physical Activity: Not on file  Stress: Not on file  Social Connections: Not on file   No Known Allergies Family History  Problem Relation Age of Onset   Arthritis Mother    Hypertension Mother    Kidney disease Mother    Diabetes Mother    Diabetes Father    Colon cancer Neg Hx    Colon polyps Neg Hx    Esophageal cancer Neg Hx    Rectal cancer Neg Hx    Stomach cancer Neg Hx     Current Outpatient Medications (Endocrine &  Metabolic):    predniSONE (DELTASONE) 5 MG tablet, Take 1 tablet (5 mg total) by mouth daily with breakfast.  Current Outpatient Medications (Cardiovascular):    hydrochlorothiazide (MICROZIDE) 12.5 MG capsule, Take 12.5 mg by mouth every other day.   Current Outpatient Medications (Analgesics):    allopurinol (ZYLOPRIM) 100 MG tablet, Take 50 mg by mouth daily.    Current Outpatient Medications (Other):    Cholecalciferol (VITAMIN D) 125 MCG (5000 UT) CAPS, Take 5,000 Units by mouth daily.    cycloSPORINE modified (NEORAL) 100 MG capsule, Take 100 mg by mouth 2 (two) times daily.   cycloSPORINE modified (NEORAL) 25 MG capsule,    magnesium oxide-pyridoxine (BEELITH) 362-20 MG TABS, Take 1 tablet by mouth daily.   Multiple Vitamin (MULTI-VITAMIN) tablet, Take by  mouth.   mycophenolate (CELLCEPT) 250 MG capsule, Take 750 mg by mouth 2 (two) times daily.   Omega-3 Fatty Acids (FISH OIL PO), Take by mouth.   selenium sulfide (SELSUN) 2.5 % shampoo, Apply topically as directed.   Sulfacetamide Sodium-Sulfur 8-4 % SUSP,    Reviewed prior external information including notes and imaging from  primary care provider As well as notes that were available from care everywhere and other healthcare systems.  Past medical history, social, surgical and family history all reviewed in electronic medical record.  No pertanent information unless stated regarding to the chief complaint.   Review of Systems:  No headache, visual changes, nausea, vomiting, diarrhea, constipation, dizziness, abdominal pain, skin rash, fevers, chills, night sweats, weight loss, swollen lymph nodes, body aches, joint swelling, chest pain, shortness of breath, mood changes. POSITIVE muscle aches  Objective  There were no vitals taken for this visit.   General: No apparent distress alert and oriented x3 mood and affect normal, dressed appropriately.  HEENT: Pupils equal, extraocular movements intact  Respiratory: Patient's speak in full sentences and does not appear short of breath  Cardiovascular: No lower extremity edema, non tender, no erythema  Gait normal with good balance and coordination.  MSK:  Non tender with full range of motion and good stability and symmetric strength and tone of shoulders, elbows, wrist, hip, knee and ankles bilaterally.     Impression and Recommendations:     The above documentation has been reviewed and is accurate and complete Seth Morales

## 2020-08-26 ENCOUNTER — Ambulatory Visit: Payer: BC Managed Care – PPO | Admitting: Family Medicine

## 2020-09-01 NOTE — Progress Notes (Signed)
[name] [address] [place] [address] Phone: [phone] Subjective:   I Seth Morales am serving as a Neurosurgeon for Dr. Antoine Primas.  This visit occurred during the SARS-CoV-2 public health emergency.  Safety protocols were in place, including screening questions prior to the visit, additional usage of staff PPE, and extensive cleaning of exam room while observing appropriate contact time as indicated for disinfecting solutions.   I'm seeing this patient by the request  of:  Seth Mam, DO  CC: Hamstring, knee pain and shoulder pain follow-up  [id number]  07/15/2020 Hamstring significantly improved from previous exam.  Patient will do more compression, discussed icing regimen.  Follow-up again in 6 to 8 weeks.  Patient given injection today and tolerated the procedure well.  Discussed icing regimen and home exercises, discussed avoiding certain activities.  Follow-up with me again in 6 to 8 weeks  Patient given injection today and tolerated the procedure well.  Discussed icing regimen and home exercises, which activities to doing which wants to avoid.  Patient will increase activity slowly.  X-rays are pending to further evaluate the amount of arthritic changes that could also be contributing.  If increasing instability will consider possible bracing.  Patient may be a candidate for viscosupplementation in the long run.  Follow-up with me again 6 weeks  Update 09/02/2020 Seth Morales is a 60 y.o. male coming in with complaint of L hamstring, R knee, AC joint arthritis.  At last exam patient did have a acromioclavicular injection as well as a right knee injection.  Patient's hamstring on ultrasound also showed interval improvement again.  Patient states the shoulder is good sometimes and the pain comes and goes. Had surgery on his nose Monday and states he had to lay on his back and the shoulder was achy anteriorly. Hamstring is mostly  back to normal. Right knee has been doing ok.   Reviewing patient's chart as well patient has been going to healthy weight and wellness and has some decreased weight in 1 year of 46 pounds.    Past Medical History:  Diagnosis Date   Allergy    Anemia    Back pain    Bilateral swelling of feet    Blood transfusion without reported diagnosis 13 years ago    Chronic kidney disease    Dermatitis    Hypertension    Joint pain    Kidney transplanted    Vitamin D deficiency    Past Surgical History:  Procedure Laterality Date   COLONOSCOPY  09/06/2012   at [place]   EYE SURGERY     INSERTION OF DIALYSIS CATHETER     KIDNEY TRANSPLANT  2006   KNEE ARTHROPLASTY Bilateral    NEPHRECTOMY Bilateral    Social History   Socioeconomic History   Marital status: Married    Spouse name: Not on file   Number of children: Not on file   Years of education: Not on file   Highest education level: Not on file  Occupational History   Not on file  Tobacco Use   Smoking status: Never   Smokeless tobacco: Never  Vaping Use   Vaping Use: Never used  Substance and Sexual Activity   Alcohol use: Yes    Alcohol/week: 2.0 standard drinks    Types: 2 Standard drinks or equivalent per week   Drug use: No   Sexual activity: Not on file  Other Topics Concern   Not on  file  Social History Narrative   Not on file   Social Determinants of Health   Financial Resource Strain: Not on file  Food Insecurity: Not on file  Transportation Needs: Not on file  Physical Activity: Not on file  Stress: Not on file  Social Connections: Not on file   No Known Allergies Family History  Problem Relation Age of Onset   Arthritis Mother    Hypertension Mother    Kidney disease Mother    Diabetes Mother    Diabetes Father    Colon cancer Neg Hx    Colon polyps Neg Hx    Esophageal cancer Neg Hx    Rectal cancer Neg Hx    Stomach cancer Neg Hx     Current Outpatient Medications (Endocrine &  Metabolic):    predniSONE (DELTASONE) 5 MG tablet, Take 1 tablet (5 mg total) by mouth daily with breakfast.  Current Outpatient Medications (Cardiovascular):    hydrochlorothiazide (MICROZIDE) 12.5 MG capsule, Take 12.5 mg by mouth every other day.   Current Outpatient Medications (Analgesics):    allopurinol (ZYLOPRIM) 100 MG tablet, Take 50 mg by mouth daily.    Current Outpatient Medications (Other):    Cholecalciferol (VITAMIN D) 125 MCG (5000 UT) CAPS, Take 5,000 Units by mouth daily.    cycloSPORINE modified (NEORAL) 100 MG capsule, Take 100 mg by mouth 2 (two) times daily.   cycloSPORINE modified (NEORAL) 25 MG capsule,    magnesium oxide-pyridoxine (BEELITH) 362-20 MG TABS, Take 1 tablet by mouth daily.   Multiple Vitamin (MULTI-VITAMIN) tablet, Take by mouth.   mycophenolate (CELLCEPT) 250 MG capsule, Take 750 mg by mouth 2 (two) times daily.   Omega-3 Fatty Acids (FISH OIL PO), Take by mouth.   selenium sulfide (SELSUN) 2.5 % shampoo, Apply topically as directed.   Sulfacetamide Sodium-Sulfur 8-4 % SUSP,    Reviewed prior external information including notes and imaging from  primary care provider As well as notes that were available from care everywhere and other healthcare systems.  Past medical history, social, surgical and family history all reviewed in electronic medical record.  No pertanent information unless stated regarding to the chief complaint.   Review of Systems:  No headache, visual changes, nausea, vomiting, diarrhea, constipation, dizziness, abdominal pain, skin rash, fevers, chills, night sweats, weight loss, swollen lymph nodes, body aches, , chest pain, shortness of breath, mood changes. POSITIVE muscle aches, joint swelling  Objective  Blood pressure 138/90, pulse 68, height 6\' 4"  (1.93 m), weight 245 lb (111.1 kg), SpO2 99 %.   General: No apparent distress alert and oriented x3 mood and affect normal, dressed appropriately.  HEENT: Pupils equal,  extraocular movements intact  Respiratory: Patient's speak in full sentences and does not appear short of breath  Cardiovascular: No lower extremity edema, non tender, no erythema  Gait mild antalgic MSK:   Left hamstring does have some mild loss of lordosis.  Patient now has good strength at the moment.  Patient does significantly more range of motion than previously.  He still has some mild weakness of the hamstring compared to the contralateral side.  Right knee exam shows trace effusion noted of the patellofemoral joint noted.  Lacks last 5 degrees of flexion.  Otherwise is doing relatively well.  Right shoulder exam shows patient still has very mild impingement noted with crossover.  Patient has very mild positive     Impression and Recommendations:    The above documentation has been reviewed and is accurate  and complete Lyndal Pulley, DO

## 2020-09-02 ENCOUNTER — Ambulatory Visit (INDEPENDENT_AMBULATORY_CARE_PROVIDER_SITE_OTHER): Payer: BC Managed Care – PPO | Admitting: Family Medicine

## 2020-09-02 ENCOUNTER — Encounter: Payer: Self-pay | Admitting: Family Medicine

## 2020-09-02 ENCOUNTER — Other Ambulatory Visit: Payer: Self-pay

## 2020-09-02 DIAGNOSIS — M19011 Primary osteoarthritis, right shoulder: Secondary | ICD-10-CM

## 2020-09-02 DIAGNOSIS — S76312A Strain of muscle, fascia and tendon of the posterior muscle group at thigh level, left thigh, initial encounter: Secondary | ICD-10-CM | POA: Diagnosis not present

## 2020-09-02 NOTE — Assessment & Plan Note (Signed)
Better at this moment.  I do believe the patient is nearly at his maximal medical improvement.  Patient has been doing relatively well with everything else and follow-up with me again 2 to 3 months.

## 2020-09-02 NOTE — Patient Instructions (Addendum)
Good to see you Wish you the best with mom and mother in law Arm compression sleeve See me again in 2-3 months

## 2020-09-02 NOTE — Assessment & Plan Note (Signed)
Significant improvement but patient does have some mild bicep tendinitis still noted on ultrasound very quickly.  Discussed the arm compression sleeve.  With patient's mother being in the hospital at this moment I will not make any other significant changes.  Follow-up with me again in 6 to 8 weeks.

## 2020-09-08 ENCOUNTER — Ambulatory Visit (INDEPENDENT_AMBULATORY_CARE_PROVIDER_SITE_OTHER): Payer: BC Managed Care – PPO | Admitting: Family Medicine

## 2020-09-08 ENCOUNTER — Encounter (INDEPENDENT_AMBULATORY_CARE_PROVIDER_SITE_OTHER): Payer: Self-pay | Admitting: Family Medicine

## 2020-09-08 ENCOUNTER — Other Ambulatory Visit: Payer: Self-pay

## 2020-09-08 VITALS — BP 162/78 | HR 58 | Temp 97.5°F | Ht 76.0 in | Wt 244.0 lb

## 2020-09-08 DIAGNOSIS — Z6833 Body mass index (BMI) 33.0-33.9, adult: Secondary | ICD-10-CM

## 2020-09-08 DIAGNOSIS — I10 Essential (primary) hypertension: Secondary | ICD-10-CM | POA: Diagnosis not present

## 2020-09-08 DIAGNOSIS — F3289 Other specified depressive episodes: Secondary | ICD-10-CM

## 2020-09-08 DIAGNOSIS — E669 Obesity, unspecified: Secondary | ICD-10-CM

## 2020-09-10 NOTE — Progress Notes (Signed)
Chief Complaint:   OBESITY Seth Morales is here to discuss his progress with his obesity treatment plan along with follow-up of his obesity related diagnoses. Seth Morales is on the Category 4 Plan and states he is following his eating plan approximately 50% of the time. Seth Morales states he is walking for 30-40 minutes 2-3 times per week.  Today's visit was #: 18 Starting weight: 285 lbs Starting date: 05/22/2019 Today's weight: 244 lbs Today's date: 09/08/2020 Total lbs lost to date: 41 Total lbs lost since last in-office visit: 0  Interim History: Seth Morales's weight is 244 lbs today, which is above goal of 228-233 lbs. He notes a lot of stress, as his mother and mother in-law are both in the hospital, and his wife had COVID etc. 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 He is on the plan at breakfast and lunch, but dinner is up for grabs. His lowest weight with [place] was 222 lbs last November.  Subjective:   1. Essential hypertension Seth Morales's blood pressure is elevated today at 162/78. He is compliant with hydrochlorothiazide. He feels this is due to stress, traffic, etc.  BP Readings from Last 3 Encounters:  09/08/20 (!) 162/78  09/02/20 138/90  07/28/20 132/83   Lab Results  Component Value Date   CREATININE 1.20 06/25/2020   CREATININE 1.02 01/20/2020   CREATININE 1.18 05/22/2019   2. Other depression with emotional eating [place] notes he has been drinking more alcohol and he is doing some stress eating.  Assessment/Plan:   1. Essential hypertension Seth Morales will continue working on healthy weight loss and exercise to improve blood pressure control. We will continue to monitor as he continues his lifestyle modifications.  2. Other depression with emotional eating Behavior modification techniques were discussed today to help Seth Morales deal with his emotional/non-hunger eating behaviors. Seth Morales declined a referral to Dr.  Dewaine Morales, and he declined medication.   3. Overweight: Current BMI 29.71 Seth Morales is currently in the action stage of change. As such, his goal is to continue with weight loss efforts. He has agreed to keeping a food journal and adhering to recommended goals of 1400-1500 calories and 100 grams of protein daily.   Exercise goals: As is.  Behavioral modification strategies: increasing lean protein intake, decreasing simple carbohydrates, and decreasing eating out.  Seth Morales has agreed to follow-up with our clinic in 6 weeks at his request.  Objective:   Blood pressure (!) 162/78, pulse (!) 58, temperature (!) 97.5 F (36.4 C), height 6\' 4"  (1.93 m), weight 244 lb (110.7 kg), SpO2 99 %. Body mass index is 29.7 kg/m.  General: Cooperative, alert, well developed, in no acute distress. HEENT: Conjunctivae and lids unremarkable. Cardiovascular: Regular rhythm.  Lungs: Normal work of breathing. Neurologic: No focal deficits.   Lab Results  Component Value Date   CREATININE 1.20 06/25/2020   BUN 21 06/25/2020   NA 143 06/25/2020   K 3.9 06/25/2020   CL 102 06/25/2020   CO2 25 06/25/2020   Lab Results  Component Value Date   ALT 26 06/25/2020   AST 23 06/25/2020   ALKPHOS 70 06/25/2020   BILITOT 0.8 06/25/2020   Lab Results  Component Value Date   HGBA1C 5.5 06/25/2020   HGBA1C 5.4 05/22/2019   HGBA1C 5.4 11/30/2018   HGBA1C 5.6 07/12/2017   HGBA1C 5.2 11/07/2014   Lab Results  Component Value Date   INSULIN 8.5  06/25/2020   INSULIN 6.0 01/20/2020   INSULIN 14.6 05/22/2019   Lab Results  Component Value Date   TSH 2.970 05/22/2019   Lab Results  Component Value Date   CHOL 180 06/25/2020   HDL 51 06/25/2020   LDLCALC 109 (H) 06/25/2020   LDLDIRECT 114.0 03/14/2019   TRIG 113 06/25/2020   CHOLHDL 3.4 01/20/2020   Lab Results  Component Value Date   VD25OH 61.9 06/25/2020   VD25OH 96.6 01/20/2020   VD25OH 56.28 03/14/2019   Lab Results  Component Value Date    WBC 6.4 01/20/2020   HGB 14.8 01/20/2020   HCT 45.5 01/20/2020   MCV 93 01/20/2020   PLT 177 01/20/2020   No results found for: IRON, TIBC, FERRITIN  Attestation Statements:   Reviewed by clinician on day of visit: allergies, medications, problem list, medical history, surgical history, family history, social history, and previous encounter notes.   Trude Mcburney, am acting as Energy manager for [place], FNP-C.  I have reviewed the above documentation for accuracy and completeness, and I agree with the above. -  Jesse Sans, FNP

## 2020-09-15 DIAGNOSIS — F32A Depression, unspecified: Secondary | ICD-10-CM | POA: Insufficient documentation

## 2020-09-28 ENCOUNTER — Telehealth: Payer: Self-pay

## 2020-09-28 NOTE — Telephone Encounter (Signed)
Pts wife was just dx with Covid. Pt has had a kidney transplant and was advised to check with his pcp about getting an EvoShield injection. Pt wanted to get a providers advice on this.  His call back # is [phone]  Thank you

## 2020-09-30 ENCOUNTER — Ambulatory Visit: Payer: BC Managed Care – PPO | Admitting: Family Medicine

## 2020-10-01 NOTE — Telephone Encounter (Signed)
Discussed pts case/concern with Dr. Heber Hunter who is the lead physician for [facility]'s covid response team and former covid hospital. He recommended as much physical distancing as possible and close monitoring of symptoms. Monoclonal antibody is only approved med for prophylaxis and Dr. Kendrick Fries does not believe we have offered it in close to 1 year.

## 2020-10-03 ENCOUNTER — Encounter: Payer: Self-pay | Admitting: Family Medicine

## 2020-10-05 NOTE — Telephone Encounter (Signed)
Sent response to patient VIA my chart message. Dm/cma

## 2020-10-06 ENCOUNTER — Encounter (INDEPENDENT_AMBULATORY_CARE_PROVIDER_SITE_OTHER): Payer: Self-pay | Admitting: Family Medicine

## 2020-10-13 ENCOUNTER — Telehealth (INDEPENDENT_AMBULATORY_CARE_PROVIDER_SITE_OTHER): Payer: Self-pay | Admitting: Family Medicine

## 2020-10-13 NOTE — Telephone Encounter (Signed)
Letter has been printed on company letterhead and mailed to patient.

## 2020-10-13 NOTE — Telephone Encounter (Signed)
Patient is not able to download letter of necessity that is in Mychart from [place] for his insurance. He would like to have letter printed and mail to him if possible.

## 2020-10-16 ENCOUNTER — Encounter (INDEPENDENT_AMBULATORY_CARE_PROVIDER_SITE_OTHER): Payer: Self-pay | Admitting: Family Medicine

## 2020-10-19 NOTE — Telephone Encounter (Signed)
Last OV with Dawn 

## 2020-10-20 ENCOUNTER — Ambulatory Visit (INDEPENDENT_AMBULATORY_CARE_PROVIDER_SITE_OTHER): Payer: BC Managed Care – PPO | Admitting: Family Medicine

## 2020-10-27 ENCOUNTER — Ambulatory Visit (INDEPENDENT_AMBULATORY_CARE_PROVIDER_SITE_OTHER): Payer: BC Managed Care – PPO | Admitting: Family Medicine

## 2020-10-27 ENCOUNTER — Encounter (INDEPENDENT_AMBULATORY_CARE_PROVIDER_SITE_OTHER): Payer: Self-pay

## 2020-11-04 ENCOUNTER — Other Ambulatory Visit: Payer: Self-pay

## 2020-11-04 ENCOUNTER — Encounter: Payer: Self-pay | Admitting: Family Medicine

## 2020-11-04 ENCOUNTER — Ambulatory Visit: Payer: BC Managed Care – PPO | Admitting: Family Medicine

## 2020-11-04 VITALS — BP 140/80 | HR 64 | Ht 76.0 in | Wt 252.8 lb

## 2020-11-04 DIAGNOSIS — M25511 Pain in right shoulder: Secondary | ICD-10-CM

## 2020-11-04 DIAGNOSIS — S63092A Other subluxation of left wrist and hand, initial encounter: Secondary | ICD-10-CM

## 2020-11-04 NOTE — Assessment & Plan Note (Signed)
Patient had more of a subluxation that occurred over this.  Discussed with patient about icing regimen and home exercises.  Discussed avoiding certain activities.  Increase activity slowly.  Follow-up with me again in 6 to 8 weeks.  Patient will do proper taping and as well as bracing at night.  Discussed that if any worsening symptoms may need to consider physical therapy but patient did not feel it was significantly beneficial with some of his other problems.  Follow-up with me again in 6 to 8 weeks.

## 2020-11-04 NOTE — Progress Notes (Signed)
[name] [address] [place] [address] Phone: [phone] Subjective:   [name], am serving as a scribe for Dr. Antoine Primas.  This visit occurred during the SARS-CoV-2 public health emergency.  Safety protocols were in place, including screening questions prior to the visit, additional usage of staff PPE, and extensive cleaning of exam room while observing appropriate contact time as indicated for disinfecting solutions.   I'm seeing this patient by the request  of:  Pcp, No  CC: right shoulder and left hamstring   EGB:[id number]  09/02/2020 Better at this moment.  I do believe the patient is nearly at his maximal medical improvement.  Patient has been doing relatively well with everything else and follow-up with me again 2 to 3 months.  Significant improvement but patient does have some mild bicep tendinitis still noted on ultrasound very quickly.  Discussed the arm compression sleeve.  With patient's mother being in the hospital at this moment I will not make any other significant changes.  Follow-up with me again in 6 to 8 weeks.  Update 11/04/2020 Seth Morales is a 60 y.o. male coming in with complaint of R shoulder and L hamstring.   Shoulder has improved. Left wrist and forearme lateral side is in pain. There was brusing and feels like it's being compressed. Finds relief when he keeps it elevated. The pain had a late onset about 2 weeks ago.      Past Medical History:  Diagnosis Date   Allergy    Anemia    Back pain    Bilateral swelling of feet    Blood transfusion without reported diagnosis 13 years ago    Chronic kidney disease    Dermatitis    Hypertension    Joint pain    Kidney transplanted    Vitamin D deficiency    Past Surgical History:  Procedure Laterality Date   COLONOSCOPY  09/06/2012   at [place]   EYE SURGERY     INSERTION OF DIALYSIS CATHETER     KIDNEY TRANSPLANT  2006   KNEE ARTHROPLASTY  Bilateral    NEPHRECTOMY Bilateral    Social History   Socioeconomic History   Marital status: Married    Spouse name: Not on file   Number of children: Not on file   Years of education: Not on file   Highest education level: Not on file  Occupational History   Not on file  Tobacco Use   Smoking status: Never   Smokeless tobacco: Never  Vaping Use   Vaping Use: Never used  Substance and Sexual Activity   Alcohol use: Yes    Alcohol/week: 2.0 standard drinks    Types: 2 Standard drinks or equivalent per week   Drug use: No   Sexual activity: Not on file  Other Topics Concern   Not on file  Social History Narrative   Not on file   Social Determinants of Health   Financial Resource Strain: Not on file  Food Insecurity: Not on file  Transportation Needs: Not on file  Physical Activity: Not on file  Stress: Not on file  Social Connections: Not on file   No Known Allergies Family History  Problem Relation Age of Onset   Arthritis Mother    Hypertension Mother    Kidney disease Mother    Diabetes Mother    Diabetes Father    Colon cancer Neg Hx    Colon polyps Neg Hx  Esophageal cancer Neg Hx    Rectal cancer Neg Hx    Stomach cancer Neg Hx     Current Outpatient Medications (Endocrine & Metabolic):    predniSONE (DELTASONE) 5 MG tablet, Take 1 tablet (5 mg total) by mouth daily with breakfast.  Current Outpatient Medications (Cardiovascular):    hydrochlorothiazide (MICROZIDE) 12.5 MG capsule, Take 12.5 mg by mouth every other day.   Current Outpatient Medications (Analgesics):    allopurinol (ZYLOPRIM) 100 MG tablet, Take 50 mg by mouth daily.    Current Outpatient Medications (Other):    Cholecalciferol (VITAMIN D) 125 MCG (5000 UT) CAPS, Take 5,000 Units by mouth daily.    cycloSPORINE modified (NEORAL) 100 MG capsule, Take 100 mg by mouth 2 (two) times daily.   cycloSPORINE modified (NEORAL) 25 MG capsule,    doxycycline (MONODOX) 100 MG capsule,  PLEASE SEE ATTACHED FOR DETAILED DIRECTIONS   magnesium oxide-pyridoxine (BEELITH) 362-20 MG TABS, Take 1 tablet by mouth daily.   Multiple Vitamin (MULTI-VITAMIN) tablet, Take by mouth.   mycophenolate (CELLCEPT) 250 MG capsule, Take 750 mg by mouth 2 (two) times daily.   Omega-3 Fatty Acids (FISH OIL PO), Take by mouth.   selenium sulfide (SELSUN) 2.5 % shampoo, Apply topically as directed.   Sulfacetamide Sodium-Sulfur 8-4 % SUSP,    Reviewed prior external information including notes and imaging from  primary care provider As well as notes that were available from care everywhere and other healthcare systems.  Past medical history, social, surgical and family history all reviewed in electronic medical record.  No pertanent information unless stated regarding to the chief complaint.   Review of Systems:  No headache, visual changes, nausea, vomiting, diarrhea, constipation, dizziness, abdominal pain, skin rash, fevers, chills, night sweats, weight loss, swollen lymph nodes, body aches, joint swelling, chest pain, shortness of breath, mood changes. POSITIVE muscle aches  Objective  Blood pressure 140/80, pulse 64, height 6\' 4"  (1.93 m), weight 252 lb 12.8 oz (114.7 kg), SpO2 98 %.   General: No apparent distress alert and oriented x3 mood and affect normal, dressed appropriately.  HEENT: Pupils equal, extraocular movements intact  Respiratory: Patient's speak in full sentences and does not appear short of breath  Cardiovascular: No lower extremity edema, non tender, no erythema  Gait antalgic MSK:   Left wrist exam shows the patient does have swelling and bruising noted to the that is consistent with the length of the extensor carpi ulnaris.  Pain with resisted extension of the wrist minorly.  Good grip strength are noted.  Minimal pain over the TFCC.  Negative Watson's.  Limited muscular skeletal ultrasound was performed and interpreted by , M  Limited ultrasound of  patient's left wrist shows the patient does have hypoechoic changes within the tendon sheath of the extensor carpi ulnaris.  No cortical irregularity of the underlying bone noted.  TFCC has some degenerative changes but no acute tear appreciated.    Impression and Recommendations:     The above documentation has been reviewed and is accurate and complete Antoine Primas, DO

## 2020-11-04 NOTE — Patient Instructions (Signed)
Exercises 3 times a week Wear brace at night Wear coban during activity See me again in 4-6 weeks

## 2020-11-23 ENCOUNTER — Encounter (INDEPENDENT_AMBULATORY_CARE_PROVIDER_SITE_OTHER): Payer: Self-pay

## 2020-11-23 ENCOUNTER — Ambulatory Visit (INDEPENDENT_AMBULATORY_CARE_PROVIDER_SITE_OTHER): Payer: BC Managed Care – PPO | Admitting: Family Medicine

## 2020-11-26 ENCOUNTER — Ambulatory Visit (INDEPENDENT_AMBULATORY_CARE_PROVIDER_SITE_OTHER): Payer: BC Managed Care – PPO | Admitting: Family Medicine

## 2020-11-26 ENCOUNTER — Other Ambulatory Visit: Payer: Self-pay

## 2020-11-26 ENCOUNTER — Encounter (INDEPENDENT_AMBULATORY_CARE_PROVIDER_SITE_OTHER): Payer: Self-pay | Admitting: Family Medicine

## 2020-11-26 VITALS — BP 138/81 | HR 63 | Temp 98.2°F | Ht 76.0 in | Wt 252.0 lb

## 2020-11-26 DIAGNOSIS — Z0000 Encounter for general adult medical examination without abnormal findings: Secondary | ICD-10-CM

## 2020-11-26 DIAGNOSIS — E8881 Metabolic syndrome: Secondary | ICD-10-CM | POA: Diagnosis not present

## 2020-11-26 DIAGNOSIS — E669 Obesity, unspecified: Secondary | ICD-10-CM | POA: Diagnosis not present

## 2020-11-26 DIAGNOSIS — Z9189 Other specified personal risk factors, not elsewhere classified: Secondary | ICD-10-CM

## 2020-11-26 DIAGNOSIS — E88819 Insulin resistance, unspecified: Secondary | ICD-10-CM

## 2020-11-26 DIAGNOSIS — Z6833 Body mass index (BMI) 33.0-33.9, adult: Secondary | ICD-10-CM

## 2020-11-30 ENCOUNTER — Encounter (INDEPENDENT_AMBULATORY_CARE_PROVIDER_SITE_OTHER): Payer: Self-pay | Admitting: Family Medicine

## 2020-11-30 DIAGNOSIS — E88819 Insulin resistance, unspecified: Secondary | ICD-10-CM

## 2020-11-30 DIAGNOSIS — E8881 Metabolic syndrome: Secondary | ICD-10-CM

## 2020-11-30 NOTE — Progress Notes (Signed)
Chief Complaint:   OBESITY Seth Morales is here to discuss his progress with his obesity treatment plan along with follow-up of his obesity related diagnoses. Seth Morales is on keeping a food journal and adhering to recommended goals of 1400-1500 calories and 100 grams of protein and states he is following his eating plan approximately 50% of the time. Seth Morales states he is doing 0 minutes 0 times per week.  Today's visit was #: 19 Starting weight: 285 lbs Starting date: 05/22/2019 Today's weight: 252 lbs Today's date: 11/26/2020 Total lbs lost to date: 33 lbs Total lbs lost since last in-office visit: 0  Interim History: Seth Morales lost his mom and mother in law in the past 2 months. His life has been crazy with work and side gigs. His lowest weight was 222 lbs last November. His weight is 252 lbs today. He has been off plan. He is not journaling.   Subjective:   1. Healthcare maintenance Seth Morales notes the need for a colonoscopy. He has had 2 colonoscopies that were not complete due to scar tissue and incomplete prep.  2. Insulin resistance We discussed Mounjaro and he is open to starting it. Seth Morales has no history of Cholelithiasis, pancreatitis, thyroid cancer.  He has a donor kidney so I would like him to check with his nephrologist before I prescribe it.  Lab Results  Component Value Date   INSULIN 8.5 06/25/2020   INSULIN 6.0 01/20/2020   INSULIN 14.6 05/22/2019   Lab Results  Component Value Date   HGBA1C 5.5 06/25/2020    3. At risk for side effect of medication Seth Morales is at risk for side effect of medication due to possible start of Mounjaro.   Assessment/Plan:   1. Healthcare maintenance Seth Morales was referred to GI.  - Ambulatory referral to Gastroenterology  2. Insulin resistance Seth Morales will contact nephrologist to see if Seth Morales is appropriate for him.   3. At risk for side effect of medication Seth Morales was given approximately 15 minutes of drug side effect  counseling today.  We discussed side effect possibility and risk versus benefits. Seth Morales agreed to the medication and will contact this office if these side effects are intolerable.  Repetitive spaced learning was employed today to elicit superior memory formation and behavioral change.   4. Obesity: Current BMI 30.69 Seth Morales is currently in the action stage of change. As such, his goal is to continue with weight loss efforts. He has agreed to keeping a food journal and adhering to recommended goals of 1400-1500 calories and 100 grams of protein daily.   Exercise goals: No exercise has been prescribed at this time.  Behavioral modification strategies: increasing lean protein intake, decreasing simple carbohydrates, and keeping a strict food journal.  Seth Morales has agreed to follow-up with our clinic in 3 weeks.  Objective:   Blood pressure 138/81, pulse 63, temperature 98.2 F (36.8 C), height 6\' 4"  (1.93 m), weight 252 lb (114.3 kg), SpO2 98 %. Body mass index is 30.67 kg/m.  General: Cooperative, alert, well developed, in no acute distress. HEENT: Conjunctivae and lids unremarkable. Cardiovascular: Regular rhythm.  Lungs: Normal work of breathing. Neurologic: No focal deficits.   Lab Results  Component Value Date   CREATININE 1.20 06/25/2020   BUN 21 06/25/2020   NA 143 06/25/2020   K 3.9 06/25/2020   CL 102 06/25/2020   CO2 25 06/25/2020   Lab Results  Component Value Date   ALT 26 06/25/2020   AST 23 06/25/2020   ALKPHOS  70 06/25/2020   BILITOT 0.8 06/25/2020   Lab Results  Component Value Date   HGBA1C 5.5 06/25/2020   HGBA1C 5.4 05/22/2019   HGBA1C 5.4 11/30/2018   HGBA1C 5.6 07/12/2017   HGBA1C 5.2 11/07/2014   Lab Results  Component Value Date   INSULIN 8.5 06/25/2020   INSULIN 6.0 01/20/2020   INSULIN 14.6 05/22/2019   Lab Results  Component Value Date   TSH 2.970 05/22/2019   Lab Results  Component Value Date   CHOL 180 06/25/2020   HDL 51  06/25/2020   LDLCALC 109 (H) 06/25/2020   LDLDIRECT 114.0 03/14/2019   TRIG 113 06/25/2020   CHOLHDL 3.4 01/20/2020   Lab Results  Component Value Date   VD25OH 61.9 06/25/2020   VD25OH 96.6 01/20/2020   VD25OH 56.28 03/14/2019   Lab Results  Component Value Date   WBC 6.4 01/20/2020   HGB 14.8 01/20/2020   HCT 45.5 01/20/2020   MCV 93 01/20/2020   PLT 177 01/20/2020   No results found for: IRON, TIBC, FERRITIN  Attestation Statements:   Reviewed by clinician on day of visit: allergies, medications, problem list, medical history, surgical history, family history, social history, and previous encounter notes.  I, Jackson Latino, RMA, am acting as Energy manager for [place], FNP.   I have reviewed the above documentation for accuracy and completeness, and I agree with the above. -  Jesse Sans, FNP

## 2020-11-30 NOTE — Telephone Encounter (Signed)
Last OV with Dawn 

## 2020-12-01 ENCOUNTER — Other Ambulatory Visit (INDEPENDENT_AMBULATORY_CARE_PROVIDER_SITE_OTHER): Payer: Self-pay | Admitting: Family Medicine

## 2020-12-01 DIAGNOSIS — E8881 Metabolic syndrome: Secondary | ICD-10-CM

## 2020-12-01 MED ORDER — TIRZEPATIDE 2.5 MG/0.5ML ~~LOC~~ SOAJ: 2.5000 mg | SUBCUTANEOUS | 0 refills | Status: DC

## 2020-12-02 NOTE — Telephone Encounter (Signed)
Prior authorization has been started for [facility]. Will notify patient and provider once response received.

## 2020-12-03 ENCOUNTER — Encounter (INDEPENDENT_AMBULATORY_CARE_PROVIDER_SITE_OTHER): Payer: Self-pay

## 2020-12-03 NOTE — Telephone Encounter (Signed)
Mounjaro denied.  See below for list of alternatives

## 2020-12-08 NOTE — Progress Notes (Signed)
[name] [address] [place] [address] Phone: [phone] Subjective:   Seth Morales, am serving as a scribe for Dr. Antoine Primas. This visit occurred during the SARS-CoV-2 public health emergency.  Safety protocols were in place, including screening questions prior to the visit, additional usage of staff PPE, and extensive cleaning of exam room while observing appropriate contact time as indicated for disinfecting solutions.   I'm seeing this patient by the request  of:  Pcp, No  CC: Left wrist pain follow-up  [id number]  11/04/2020 Patient had more of a subluxation that occurred over this.  Discussed with patient about icing regimen and home exercises.  Discussed avoiding certain activities.  Increase activity slowly.  Follow-up with me again in 6 to 8 weeks.  Patient will do proper taping and as well as bracing at night.  Discussed that if any worsening symptoms may need to consider physical therapy but patient did not feel it was significantly beneficial with some of his other problems.  Follow-up with me again in 6 to 8 weeks.  Update 12/09/2020 Seth Morales is a 60 y.o. male coming in with complaint of L wrist pain. Patient states that his pain has subsided. Feels that the brace helped and that after one week his pain was gone.  Patient states that the shoulder is in trouble from time to time but nothing severe.  Patient has still not been very active and feels like that is still with giving him some of the knee pain.  Does have arthritic changes noted previously.       Past Medical History:  Diagnosis Date   Allergy    Anemia    Back pain    Bilateral swelling of feet    Blood transfusion without reported diagnosis 13 years ago    Chronic kidney disease    Dermatitis    Hypertension    Joint pain    Kidney transplanted    Vitamin D deficiency    Past Surgical History:  Procedure Laterality Date   COLONOSCOPY   09/06/2012   at [place]   EYE SURGERY     INSERTION OF DIALYSIS CATHETER     KIDNEY TRANSPLANT  2006   KNEE ARTHROPLASTY Bilateral    NEPHRECTOMY Bilateral    Social History   Socioeconomic History   Marital status: Married    Spouse name: Not on file   Number of children: Not on file   Years of education: Not on file   Highest education level: Not on file  Occupational History   Not on file  Tobacco Use   Smoking status: Never   Smokeless tobacco: Never  Vaping Use   Vaping Use: Never used  Substance and Sexual Activity   Alcohol use: Yes    Alcohol/week: 2.0 standard drinks    Types: 2 Standard drinks or equivalent per week   Drug use: No   Sexual activity: Not on file  Other Topics Concern   Not on file  Social History Narrative   Not on file   Social Determinants of Health   Financial Resource Strain: Not on file  Food Insecurity: Not on file  Transportation Needs: Not on file  Physical Activity: Not on file  Stress: Not on file  Social Connections: Not on file   No Known Allergies Family History  Problem Relation Age of Onset   Arthritis Mother    Hypertension Mother    Kidney disease Mother  Diabetes Mother    Diabetes Father    Colon cancer Neg Hx    Colon polyps Neg Hx    Esophageal cancer Neg Hx    Rectal cancer Neg Hx    Stomach cancer Neg Hx     Current Outpatient Medications (Endocrine & Metabolic):    predniSONE (DELTASONE) 5 MG tablet, Take 1 tablet (5 mg total) by mouth daily with breakfast.   tirzepatide (MOUNJARO) 2.5 MG/0.5ML Pen, Inject 2.5 mg into the skin once a week.  Current Outpatient Medications (Cardiovascular):    hydrochlorothiazide (MICROZIDE) 12.5 MG capsule, Take 12.5 mg by mouth every other day.   Current Outpatient Medications (Analgesics):    allopurinol (ZYLOPRIM) 100 MG tablet, Take 50 mg by mouth daily.    Current Outpatient Medications (Other):    Cholecalciferol (VITAMIN D) 125 MCG (5000 UT) CAPS,  Take 5,000 Units by mouth daily.    cycloSPORINE modified (NEORAL) 100 MG capsule, Take 100 mg by mouth 2 (two) times daily.   cycloSPORINE modified (NEORAL) 25 MG capsule,    doxycycline (MONODOX) 100 MG capsule, PLEASE SEE ATTACHED FOR DETAILED DIRECTIONS   magnesium oxide-pyridoxine (BEELITH) 362-20 MG TABS, Take 1 tablet by mouth daily.   Multiple Vitamin (MULTI-VITAMIN) tablet, Take by mouth.   mycophenolate (CELLCEPT) 250 MG capsule, Take 750 mg by mouth 2 (two) times daily.   Omega-3 Fatty Acids (FISH OIL PO), Take by mouth.   selenium sulfide (SELSUN) 2.5 % shampoo, Apply topically as directed.   Sulfacetamide Sodium-Sulfur 8-4 % SUSP,    Reviewed prior external information including notes and imaging from  primary care provider As well as notes that were available from care everywhere and other healthcare systems.  Past medical history, social, surgical and family history all reviewed in electronic medical record.  No pertanent information unless stated regarding to the chief complaint.   Review of Systems:  No headache, visual changes, nausea, vomiting, diarrhea, constipation, dizziness, abdominal pain, skin rash, fevers, chills, night sweats, weight loss, swollen lymph nodes, body aches, joint swelling, chest pain, shortness of breath, mood changes. POSITIVE muscle aches  Objective  Blood pressure 132/82, pulse (!) 57, height 6\' 4"  (1.93 m), weight 253 lb (114.8 kg), SpO2 97 %.   General: No apparent distress alert and oriented x3 mood and affect normal, dressed appropriately.  HEENT: Pupils equal, extraocular movements intact  Respiratory: Patient's speak in full sentences and does not appear short of breath  Cardiovascular: No lower extremity edema, non tender, no erythema  Gait normal with good balance and coordination.  MSK: Arthritic changes of multiple joints but seems to be doing relatively well.  Full range of motion of the wrist with no significant difficulty on the  left side.  Sitting comfortably.  Does have some mild atrophy of the hamstrings and quads bilaterally.  Arthritic changes noted of the knees but seems to be stable.    Impression and Recommendations:     The above documentation has been reviewed and is accurate and complete , DO

## 2020-12-09 ENCOUNTER — Ambulatory Visit: Payer: BC Managed Care – PPO | Admitting: Family Medicine

## 2020-12-09 ENCOUNTER — Ambulatory Visit: Payer: Self-pay

## 2020-12-09 ENCOUNTER — Other Ambulatory Visit: Payer: Self-pay

## 2020-12-09 VITALS — BP 132/82 | HR 57 | Ht 76.0 in | Wt 253.0 lb

## 2020-12-09 DIAGNOSIS — S63092A Other subluxation of left wrist and hand, initial encounter: Secondary | ICD-10-CM

## 2020-12-09 DIAGNOSIS — S76312A Strain of muscle, fascia and tendon of the posterior muscle group at thigh level, left thigh, initial encounter: Secondary | ICD-10-CM

## 2020-12-09 DIAGNOSIS — M19011 Primary osteoarthritis, right shoulder: Secondary | ICD-10-CM | POA: Diagnosis not present

## 2020-12-09 DIAGNOSIS — M25532 Pain in left wrist: Secondary | ICD-10-CM | POA: Diagnosis not present

## 2020-12-09 NOTE — Assessment & Plan Note (Signed)
No pain at all at this time.

## 2020-12-09 NOTE — Assessment & Plan Note (Signed)
Known arthritic changes.  Has responded well to the injection.  Can always repeat if necessary.  We will continue to monitor.  Follow-up again in 2 to 3 months.  Secondary to patient's kidneys we will avoid anti-inflammatories were possible.

## 2020-12-09 NOTE — Assessment & Plan Note (Signed)
Patient is sitting comfortably at this time I given him any difficulty.  Patient does need to increase activity but overall is feeling decent.  Did eat try to get patient a trip on to join a gym.  Patient declined any more formal physical therapy.  Follow-up again in 2 to 3 months

## 2020-12-16 ENCOUNTER — Encounter (INDEPENDENT_AMBULATORY_CARE_PROVIDER_SITE_OTHER): Payer: Self-pay | Admitting: Family Medicine

## 2020-12-16 ENCOUNTER — Other Ambulatory Visit: Payer: Self-pay

## 2020-12-16 ENCOUNTER — Ambulatory Visit (INDEPENDENT_AMBULATORY_CARE_PROVIDER_SITE_OTHER): Payer: BC Managed Care – PPO | Admitting: Family Medicine

## 2020-12-16 VITALS — BP 125/76 | HR 73 | Temp 98.1°F | Ht 76.0 in | Wt 244.0 lb

## 2020-12-16 DIAGNOSIS — E669 Obesity, unspecified: Secondary | ICD-10-CM

## 2020-12-16 DIAGNOSIS — Z6833 Body mass index (BMI) 33.0-33.9, adult: Secondary | ICD-10-CM

## 2020-12-16 DIAGNOSIS — Z1211 Encounter for screening for malignant neoplasm of colon: Secondary | ICD-10-CM

## 2020-12-16 DIAGNOSIS — E88819 Insulin resistance, unspecified: Secondary | ICD-10-CM

## 2020-12-16 DIAGNOSIS — E8881 Metabolic syndrome: Secondary | ICD-10-CM

## 2020-12-16 DIAGNOSIS — K5909 Other constipation: Secondary | ICD-10-CM

## 2020-12-16 MED ORDER — TIRZEPATIDE 2.5 MG/0.5ML ~~LOC~~ SOAJ: 2.5000 mg | SUBCUTANEOUS | 0 refills | Status: DC

## 2020-12-16 NOTE — Progress Notes (Addendum)
Chief Complaint:   OBESITY Seth Morales is here to discuss his progress with his obesity treatment plan along with follow-up of his obesity related diagnoses. Seth Morales is on the Category 3 Plan and keeping a food journal and adhering to recommended goals of 1400-1500 calories and 100 grams of protein and states he is following his eating plan approximately 50% of the time. Seth Morales states he is walking for 20-30 minutes 3 times per week.  Today's visit was #: 20 Starting weight: 285 lbs Starting date: 05/22/2019 Today's weight: 244 lbs Today's date: 12/16/2020 Total lbs lost to date: 41 lbs Total lbs lost since last in-office visit: 8 lbs  Interim History: Seth Morales notes his appetite is well controlled with Mounjaro. He is very happy with the effects of Mounjaro. He is not journaling but plans on starting.  Subjective:   1. Other constipation Seth Morales notes constipation with Mounjaro. He started Miralax yesterday as well as had some prune juice. His water intake is good.  2. Insulin resistance Seth Morales was started on Mounjaro 2.5 mg at last office visit. He notes constipation. His appetite is well controlled. He notes portions are much smaller now.  Lab Results  Component Value Date   INSULIN 8.5 06/25/2020   INSULIN 6.0 01/20/2020   INSULIN 14.6 05/22/2019   Lab Results  Component Value Date   HGBA1C 5.5 06/25/2020    3. Screening for colon cancer Seth Morales needs screening colonoscopy.   Assessment/Plan:   1. Other constipation  Seth Morales may use Miralax daily up to twice daily. May use Mag citrate times 1. He will increase water and vegetables.     2. Insulin resistance  We will refill Mounjaro 2.5 mg weekly. Seth Morales agreed to follow-up with [place] as directed to closely monitor his progress.  - tirzepatide Seth Morales) 2.5 MG/0.5ML Pen; Inject 2.5 mg into the skin once a week.  Dispense: 2 mL; Refill: 0  3. Screening for colon cancer A referral to Gastroenterology for  colonoscopy was put in today for Seth Morales.   - Ambulatory referral to Gastroenterology  4. Obesity: Current BMI 30.69 Seth Morales is currently in the action stage of change. As such, his goal is to continue with weight loss efforts. He has agreed to keeping a food journal and adhering to recommended goals of 1400-1500 calories and 100 grams of protein daily.  Seth Morales will eat protein first since appetite is low due start of Mounjaro.  Exercise goals:  As is.   Behavioral modification strategies: increasing vegetables and increasing water intake.  Seth Morales has agreed to follow-up with our clinic in 3 weeks.  Objective:   Blood pressure 125/76, pulse 73, temperature 98.1 F (36.7 C), height 6\' 4"  (1.93 m), weight 244 lb (110.7 kg), SpO2 98 %. Body mass index is 29.7 kg/m.  General: Cooperative, alert, well developed, in no acute distress. HEENT: Conjunctivae and lids unremarkable. Cardiovascular: Regular rhythm.  Lungs: Normal work of breathing. Neurologic: No focal deficits.   Lab Results  Component Value Date   CREATININE 1.20 06/25/2020   BUN 21 06/25/2020   NA 143 06/25/2020   K 3.9 06/25/2020   CL 102 06/25/2020   CO2 25 06/25/2020   Lab Results  Component Value Date   ALT 26 06/25/2020   AST 23 06/25/2020   ALKPHOS 70 06/25/2020   BILITOT 0.8 06/25/2020   Lab Results  Component Value Date   HGBA1C 5.5 06/25/2020   HGBA1C 5.4 05/22/2019   HGBA1C 5.4 11/30/2018   HGBA1C 5.6 07/12/2017  HGBA1C 5.2 11/07/2014   Lab Results  Component Value Date   INSULIN 8.5 06/25/2020   INSULIN 6.0 01/20/2020   INSULIN 14.6 05/22/2019   Lab Results  Component Value Date   TSH 2.970 05/22/2019   Lab Results  Component Value Date   CHOL 180 06/25/2020   HDL 51 06/25/2020   LDLCALC 109 (H) 06/25/2020   LDLDIRECT 114.0 03/14/2019   TRIG 113 06/25/2020   CHOLHDL 3.4 01/20/2020   Lab Results  Component Value Date   VD25OH 61.9 06/25/2020   VD25OH 96.6 01/20/2020    VD25OH 56.28 03/14/2019   Lab Results  Component Value Date   WBC 6.4 01/20/2020   HGB 14.8 01/20/2020   HCT 45.5 01/20/2020   MCV 93 01/20/2020   PLT 177 01/20/2020   No results found for: IRON, TIBC, FERRITIN  Attestation Statements:   Reviewed by clinician on day of visit: allergies, medications, problem list, medical history, surgical history, family history, social history, and previous encounter notes.  I, Jackson Latino, RMA, am acting as Energy manager for [place], FNP.   I have reviewed the above documentation for accuracy and completeness, and I agree with the above. -  Jesse Sans, FNP

## 2020-12-17 NOTE — Telephone Encounter (Signed)
Last seen by Dawn  

## 2020-12-17 NOTE — Telephone Encounter (Signed)
Dawn, please advise 

## 2020-12-17 NOTE — Telephone Encounter (Signed)
Please advise 

## 2020-12-21 ENCOUNTER — Encounter (INDEPENDENT_AMBULATORY_CARE_PROVIDER_SITE_OTHER): Payer: Self-pay | Admitting: Family Medicine

## 2020-12-21 DIAGNOSIS — K5909 Other constipation: Secondary | ICD-10-CM | POA: Insufficient documentation

## 2021-01-05 ENCOUNTER — Other Ambulatory Visit (INDEPENDENT_AMBULATORY_CARE_PROVIDER_SITE_OTHER): Payer: Self-pay | Admitting: Family Medicine

## 2021-01-05 DIAGNOSIS — Z1211 Encounter for screening for malignant neoplasm of colon: Secondary | ICD-10-CM

## 2021-01-06 ENCOUNTER — Ambulatory Visit (INDEPENDENT_AMBULATORY_CARE_PROVIDER_SITE_OTHER): Payer: BC Managed Care – PPO | Admitting: Family Medicine

## 2021-01-06 ENCOUNTER — Other Ambulatory Visit: Payer: Self-pay

## 2021-01-06 ENCOUNTER — Encounter (INDEPENDENT_AMBULATORY_CARE_PROVIDER_SITE_OTHER): Payer: Self-pay | Admitting: Family Medicine

## 2021-01-06 VITALS — BP 132/83 | HR 79 | Temp 97.8°F | Ht 76.0 in | Wt 244.0 lb

## 2021-01-06 DIAGNOSIS — K5909 Other constipation: Secondary | ICD-10-CM | POA: Diagnosis not present

## 2021-01-06 DIAGNOSIS — E8881 Metabolic syndrome: Secondary | ICD-10-CM

## 2021-01-06 DIAGNOSIS — E669 Obesity, unspecified: Secondary | ICD-10-CM

## 2021-01-06 DIAGNOSIS — Z6833 Body mass index (BMI) 33.0-33.9, adult: Secondary | ICD-10-CM

## 2021-01-06 MED ORDER — TIRZEPATIDE 2.5 MG/0.5ML ~~LOC~~ SOAJ: 2.5000 mg | SUBCUTANEOUS | 0 refills | Status: DC

## 2021-01-06 NOTE — Progress Notes (Signed)
Chief Complaint:   OBESITY Seth Morales is here to discuss his progress with his obesity treatment plan along with follow-up of his obesity related diagnoses. Seth Morales is on the Category 3 Plan and keeping a food journal and adhering to recommended goals of 1400-1500 calories and 100 grams of protein and states he is following his eating plan approximately 80% of the time. Seth Morales states he is walking for 40 minutes 2-3 times per week.  Today's visit was #: 21 Starting weight: 285 lbs Starting date: 05/22/2019 Today's weight: 244 lbs Today's date: 01/06/2021 Total lbs lost to date: 41 lbs Total lbs lost since last in-office visit: 0  Interim History: Seth Morales had candy over Halloween but much less than usual. He did not journal at all. He would really like to lose down to 230 lbs (27 BMI). He states Seth Morales helps with portion control.   Subjective:   1. Other constipation Seth Morales's constipation is no better controlled. Miralax does not work well for him. He has increased fiber intake which seems to be working..   2. Insulin resistance Seth Morales is on Mounjaro 2.5 mg. He feels it works well for appetite suppression.   Lab Results  Component Value Date   INSULIN 8.5 06/25/2020   INSULIN 6.0 01/20/2020   INSULIN 14.6 05/22/2019   Lab Results  Component Value Date   HGBA1C 5.5 06/25/2020    Assessment/Plan:   1. Other constipation  Seth Morales will continue increased fiber and water intake.   2. Insulin resistance  We will refill Mounjaro 2.5 mg weekly. Seth Morales agreed to follow-up with [place] as directed to closely monitor his progress.  - tirzepatide [facility]) 2.5 MG/0.5ML Pen; Inject 2.5 mg into the skin once a week.  Dispense: 2 mL; Refill: 0  3. Overweight: Current BMI 29.71 Seth Morales is currently in the action stage of change. As such, his goal is to continue with weight loss efforts. He has agreed to practicing portion control and making smarter food choices, such as increasing  vegetables and decreasing simple carbohydrates.   Exercise goals:  Seth Morales will add resistance training to regimen.  Behavioral modification strategies: increasing lean protein intake and decreasing simple carbohydrates.  Seth Morales has agreed to follow-up with our clinic in 3-4 weeks.  Objective:   Blood pressure 132/83, pulse 79, temperature 97.8 F (36.6 C), height 6\' 4"  (1.93 m), weight 244 lb (110.7 kg), SpO2 97 %. Body mass index is 29.7 kg/m.  General: Cooperative, alert, well developed, in no acute distress. HEENT: Conjunctivae and lids unremarkable. Cardiovascular: Regular rhythm.  Lungs: Normal work of breathing. Neurologic: No focal deficits.   Lab Results  Component Value Date   CREATININE 1.20 06/25/2020   BUN 21 06/25/2020   NA 143 06/25/2020   K 3.9 06/25/2020   CL 102 06/25/2020   CO2 25 06/25/2020   Lab Results  Component Value Date   ALT 26 06/25/2020   AST 23 06/25/2020   ALKPHOS 70 06/25/2020   BILITOT 0.8 06/25/2020   Lab Results  Component Value Date   HGBA1C 5.5 06/25/2020   HGBA1C 5.4 05/22/2019   HGBA1C 5.4 11/30/2018   HGBA1C 5.6 07/12/2017   HGBA1C 5.2 11/07/2014   Lab Results  Component Value Date   INSULIN 8.5 06/25/2020   INSULIN 6.0 01/20/2020   INSULIN 14.6 05/22/2019   Lab Results  Component Value Date   TSH 2.970 05/22/2019   Lab Results  Component Value Date   CHOL 180 06/25/2020   HDL 51 06/25/2020  LDLCALC 109 (H) 06/25/2020   LDLDIRECT 114.0 03/14/2019   TRIG 113 06/25/2020   CHOLHDL 3.4 01/20/2020   Lab Results  Component Value Date   VD25OH 61.9 06/25/2020   VD25OH 96.6 01/20/2020   VD25OH 56.28 03/14/2019   Lab Results  Component Value Date   WBC 6.4 01/20/2020   HGB 14.8 01/20/2020   HCT 45.5 01/20/2020   MCV 93 01/20/2020   PLT 177 01/20/2020   No results found for: IRON, TIBC, FERRITIN  Attestation Statements:   Reviewed by clinician on day of visit: allergies, medications, problem list, medical  history, surgical history, family history, social history, and previous encounter notes.  I, Jackson Latino, RMA, am acting as Energy manager for [place], FNP.   I have reviewed the above documentation for accuracy and completeness, and I agree with the above. -  Jesse Sans, FNP

## 2021-01-13 ENCOUNTER — Encounter: Payer: Self-pay | Admitting: Gastroenterology

## 2021-02-03 ENCOUNTER — Other Ambulatory Visit: Payer: Self-pay

## 2021-02-03 ENCOUNTER — Ambulatory Visit (INDEPENDENT_AMBULATORY_CARE_PROVIDER_SITE_OTHER): Payer: BC Managed Care – PPO | Admitting: Family Medicine

## 2021-02-03 ENCOUNTER — Encounter (INDEPENDENT_AMBULATORY_CARE_PROVIDER_SITE_OTHER): Payer: Self-pay | Admitting: Family Medicine

## 2021-02-03 VITALS — BP 133/76 | HR 76 | Temp 97.7°F | Ht 76.0 in | Wt 242.0 lb

## 2021-02-03 DIAGNOSIS — E8881 Metabolic syndrome: Secondary | ICD-10-CM | POA: Diagnosis not present

## 2021-02-03 DIAGNOSIS — E669 Obesity, unspecified: Secondary | ICD-10-CM

## 2021-02-03 DIAGNOSIS — K5909 Other constipation: Secondary | ICD-10-CM | POA: Diagnosis not present

## 2021-02-03 DIAGNOSIS — Z6833 Body mass index (BMI) 33.0-33.9, adult: Secondary | ICD-10-CM | POA: Diagnosis not present

## 2021-02-03 MED ORDER — TIRZEPATIDE 2.5 MG/0.5ML ~~LOC~~ SOAJ: 2.5000 mg | SUBCUTANEOUS | 0 refills | Status: DC

## 2021-02-03 NOTE — Progress Notes (Addendum)
Chief Complaint:   OBESITY Seth Morales is here to discuss his progress with his obesity treatment plan along with follow-up of his obesity related diagnoses. Kellie is on practicing portion control and making smarter food choices, such as increasing vegetables and decreasing simple carbohydrates and states he is following his eating plan approximately 100% of the time. Jashan states he is walking for 30-40 minutes 1-2 times per week.  Today's visit was #: 22 Starting weight: 285 lbs Starting date: 05/22/2019 Today's weight: 242 lbs Today's date: 02/03/2021 Total lbs lost to date: 43 lbs Total lbs lost since last in-office visit: 2 lbs  Interim History: Seth Morales is surprised with his weight loss today due to indulging over the holidays. He will be having a lot of gigs for Christmas which will require some traveling. He is eating out less and reducing carbohydrates.  Subjective:   1. Insulin resistance Brick notices a peak and trough effect of Mounjaro. He does notes appetite suppression from Mounjaro 2.5 mg. He notes some dyspepsia when he eats poorly.  Lab Results  Component Value Date   INSULIN 8.5 06/25/2020   INSULIN 6.0 01/20/2020   INSULIN 14.6 05/22/2019   Lab Results  Component Value Date   HGBA1C 5.5 06/25/2020    2. Other constipation Seth Morales notes constipation most the week due to the [facility]. He only uses Miralax sporadically.  Assessment/Plan:   1. Insulin resistance We will refill Mounjaro 2.5 mg weekly. [place] agreed to follow-up with [place] as directed to closely monitor his progress.  - tirzepatide [facility]) 2.5 MG/0.5ML Pen; Inject 2.5 mg into the skin once a week.  Dispense: 2 mL; Refill: 0  2. Other constipation  Seth Morales was encouraged to try Miralax daily vs sporadically.   3. Overweight: Current BMI 29.47 Seth Morales is currently in the action stage of change. As such, his goal is to continue with weight loss efforts. He has agreed to practicing  portion control and making smarter food choices, such as increasing vegetables and decreasing simple carbohydrates.   Exercise goals:  As is.  Behavioral modification strategies: increasing lean protein intake and travel eating strategies.  Seth Morales has agreed to follow-up with our clinic in 5 weeks.  Objective:   Blood pressure 133/76, pulse 76, temperature 97.7 F (36.5 C), height 6\' 4"  (1.93 m), weight 242 lb (109.8 kg), SpO2 99 %. Body mass index is 29.46 kg/m.  General: Cooperative, alert, well developed, in no acute distress. HEENT: Conjunctivae and lids unremarkable. Cardiovascular: Regular rhythm.  Lungs: Normal work of breathing. Neurologic: No focal deficits.   Lab Results  Component Value Date   CREATININE 1.20 06/25/2020   BUN 21 06/25/2020   NA 143 06/25/2020   K 3.9 06/25/2020   CL 102 06/25/2020   CO2 25 06/25/2020   Lab Results  Component Value Date   ALT 26 06/25/2020   AST 23 06/25/2020   ALKPHOS 70 06/25/2020   BILITOT 0.8 06/25/2020   Lab Results  Component Value Date   HGBA1C 5.5 06/25/2020   HGBA1C 5.4 05/22/2019   HGBA1C 5.4 11/30/2018   HGBA1C 5.6 07/12/2017   HGBA1C 5.2 11/07/2014   Lab Results  Component Value Date   INSULIN 8.5 06/25/2020   INSULIN 6.0 01/20/2020   INSULIN 14.6 05/22/2019   Lab Results  Component Value Date   TSH 2.970 05/22/2019   Lab Results  Component Value Date   CHOL 180 06/25/2020   HDL 51 06/25/2020   LDLCALC 109 (H) 06/25/2020  LDLDIRECT 114.0 03/14/2019   TRIG 113 06/25/2020   CHOLHDL 3.4 01/20/2020   Lab Results  Component Value Date   VD25OH 61.9 06/25/2020   VD25OH 96.6 01/20/2020   VD25OH 56.28 03/14/2019   Lab Results  Component Value Date   WBC 6.4 01/20/2020   HGB 14.8 01/20/2020   HCT 45.5 01/20/2020   MCV 93 01/20/2020   PLT 177 01/20/2020   No results found for: IRON, TIBC, FERRITIN  Attestation Statements:   Reviewed by clinician on day of visit: allergies, medications,  problem list, medical history, surgical history, family history, social history, and previous encounter notes.  I, Lizbeth Bark, RMA, am acting as Location manager for [employer], [place].   I have reviewed the above documentation for accuracy and completeness, and I agree with the above. -  Georgianne Fick, FNP

## 2021-02-09 NOTE — Progress Notes (Signed)
[name] [address] [place] [address] Phone: [phone] Subjective:   Seth Morales, am serving as a scribe for Dr. Antoine Primas.  This visit occurred during the SARS-CoV-2 public health emergency.  Safety protocols were in place, including screening questions prior to the visit, additional usage of staff PPE, and extensive cleaning of exam room while observing appropriate contact time as indicated for disinfecting solutions.    CC: Right knee pain, right shoulder pain  [id number]  12/09/2020 Known arthritic changes.  Has responded well to the injection.  Can always repeat if necessary.  We will continue to monitor.  Follow-up again in 2 to 3 months.  Secondary to patient's kidneys we will avoid anti-inflammatories were possible.  Patient is sitting comfortably at this time I given him any difficulty.  Patient does need to increase activity but overall is feeling decent.  Did eat try to get patient a trip on to join a gym.  Patient declined any more formal physical therapy.  Follow-up again in 2 to 3 months  No pain at all at this time.  Update 02/10/2021 Seth Morales is a 61 y.o. male coming in with complaint of R shoulder and L hamstring pain. Patient states that his hamstring is doing better but feels like his knee is going to hyperextend when he is standing or walking intermittently. Also complains of tightness and pain over top of patella with stair climbing.   R shoulder pain is the same as last visit. Notices pain in the morning that will ache throughout the day.   Also having 2nd finger distal phalanx pain due to resting trumpet on this area of his hand.       Past Medical History:  Diagnosis Date   Allergy    Anemia    Back pain    Bilateral swelling of feet    Blood transfusion without reported diagnosis 13 years ago    Chronic kidney disease    Dermatitis    Hypertension    Joint pain    Kidney transplanted     Vitamin D deficiency    Past Surgical History:  Procedure Laterality Date   COLONOSCOPY  09/06/2012   at [place]   EYE SURGERY     INSERTION OF DIALYSIS CATHETER     KIDNEY TRANSPLANT  2006   KNEE ARTHROPLASTY Bilateral    NEPHRECTOMY Bilateral    Social History   Socioeconomic History   Marital status: Married    Spouse name: Not on file   Number of children: Not on file   Years of education: Not on file   Highest education level: Not on file  Occupational History   Not on file  Tobacco Use   Smoking status: Never   Smokeless tobacco: Never  Vaping Use   Vaping Use: Never used  Substance and Sexual Activity   Alcohol use: Yes    Alcohol/week: 2.0 standard drinks    Types: 2 Standard drinks or equivalent per week   Drug use: No   Sexual activity: Not on file  Other Topics Concern   Not on file  Social History Narrative   Not on file   Social Determinants of Health   Financial Resource Strain: Not on file  Food Insecurity: Not on file  Transportation Needs: Not on file  Physical Activity: Not on file  Stress: Not on file  Social Connections: Not on file   No Known Allergies Family History  Problem Relation Age of Onset   Arthritis Mother    Hypertension Mother    Kidney disease Mother    Diabetes Mother    Diabetes Father    Colon cancer Neg Hx    Colon polyps Neg Hx    Esophageal cancer Neg Hx    Rectal cancer Neg Hx    Stomach cancer Neg Hx     Current Outpatient Medications (Endocrine & Metabolic):    predniSONE (DELTASONE) 5 MG tablet, Take 1 tablet (5 mg total) by mouth daily with breakfast.   tirzepatide (MOUNJARO) 2.5 MG/0.5ML Pen, Inject 2.5 mg into the skin once a week.  Current Outpatient Medications (Cardiovascular):    hydrochlorothiazide (MICROZIDE) 12.5 MG capsule, Take 12.5 mg by mouth every other day.   Current Outpatient Medications (Analgesics):    allopurinol (ZYLOPRIM) 100 MG tablet, Take 50 mg by mouth daily.     Current Outpatient Medications (Other):    Cholecalciferol (VITAMIN D) 125 MCG (5000 UT) CAPS, Take 5,000 Units by mouth daily.    cycloSPORINE modified (NEORAL) 100 MG capsule, Take 100 mg by mouth 2 (two) times daily.   cycloSPORINE modified (NEORAL) 25 MG capsule,    doxycycline (MONODOX) 100 MG capsule, PLEASE SEE ATTACHED FOR DETAILED DIRECTIONS   magnesium oxide-pyridoxine (BEELITH) 362-20 MG TABS, Take 1 tablet by mouth daily.   Multiple Vitamin (MULTI-VITAMIN) tablet, Take by mouth.   mycophenolate (CELLCEPT) 250 MG capsule, Take 750 mg by mouth 2 (two) times daily.   Omega-3 Fatty Acids (FISH OIL PO), Take by mouth.   selenium sulfide (SELSUN) 2.5 % shampoo, Apply topically as directed.   Sulfacetamide Sodium-Sulfur 8-4 % SUSP,    Reviewed prior external information including notes and imaging from  primary care provider As well as notes that were available from care everywhere and other healthcare systems.  Past medical history, social, surgical and family history all reviewed in electronic medical record.  No pertanent information unless stated regarding to the chief complaint.   Review of Systems:  No headache, visual changes, nausea, vomiting, diarrhea, constipation, dizziness, abdominal pain, skin rash, fevers, chills, night sweats, weight loss, swollen lymph nodes, body aches, joint swelling, chest pain, shortness of breath, mood changes. POSITIVE muscle aches  Objective  Blood pressure 116/82, pulse 74, height 6\' 4"  (1.93 m), weight 249 lb (112.9 kg), SpO2 98 %.   General: No apparent distress alert and oriented x3 mood and affect normal, dressed appropriately.  HEENT: Pupils equal, extraocular movements intact  Respiratory: Patient's speak in full sentences and does not appear short of breath  Cardiovascular: No lower extremity edema, non tender, no erythema  Gait mild antalgic Right knee exam shows patient arthritic changes noted.  Patient does have effusion  noted of the joint.  Limited instability with valgus and varus force.  Right shoulder exam does have a positive crossover.  Tenderness of the acromioclavicular joint.  Patient has good strength of the rotator cuff.  Left knee exam shows the patient's hamstring is doing very well.  After informed written and verbal consent, patient was seated on exam table. Right knee was prepped with alcohol swab and utilizing anterolateral approach, patient's right knee space was injected with 2 cc of marcaine 0.5% then aspirated 30 cc of straw light-colored fluid then injected 1 cc of Kenalog 40mg /dL. Patient tolerated the procedure well without immediate complications.  Procedure: Real-time Ultrasound Guided Injection of right acromioclavicular joint Device: GE Logiq Q7 Ultrasound guided injection is preferred based studies that show increased  duration, increased effect, greater accuracy, decreased procedural pain, increased response rate, and decreased cost with ultrasound guided versus blind injection.  Verbal informed consent obtained.  Time-out conducted.  Noted no overlying erythema, induration, or other signs of local infection.  Skin prepped in a sterile fashion.  Local anesthesia: Topical Ethyl chloride.  With sterile technique and under real time ultrasound guidance: With a 25-gauge half inch needle injected with 0.5 cc of 0.5% Marcaine and 0.5 cc of Kenalog 40 mg/mL Completed without difficulty  Pain immediately resolved suggesting accurate placement of the medication.  Advised to call if fevers/chills, erythema, induration, drainage, or persistent bleeding.  Impression: Technically successful ultrasound guided injection.   Impression and Recommendations:     The above documentation has been reviewed and is accurate and complete Judi Saa, DO

## 2021-02-10 ENCOUNTER — Ambulatory Visit: Payer: Self-pay

## 2021-02-10 ENCOUNTER — Ambulatory Visit: Payer: BC Managed Care – PPO | Admitting: Family Medicine

## 2021-02-10 ENCOUNTER — Encounter: Payer: Self-pay | Admitting: Family Medicine

## 2021-02-10 ENCOUNTER — Ambulatory Visit (INDEPENDENT_AMBULATORY_CARE_PROVIDER_SITE_OTHER): Payer: BC Managed Care – PPO

## 2021-02-10 ENCOUNTER — Other Ambulatory Visit: Payer: Self-pay

## 2021-02-10 VITALS — BP 116/82 | HR 74 | Ht 76.0 in | Wt 249.0 lb

## 2021-02-10 DIAGNOSIS — M1711 Unilateral primary osteoarthritis, right knee: Secondary | ICD-10-CM | POA: Diagnosis not present

## 2021-02-10 DIAGNOSIS — M25562 Pain in left knee: Secondary | ICD-10-CM

## 2021-02-10 DIAGNOSIS — M19011 Primary osteoarthritis, right shoulder: Secondary | ICD-10-CM | POA: Diagnosis not present

## 2021-02-10 DIAGNOSIS — M25511 Pain in right shoulder: Secondary | ICD-10-CM | POA: Diagnosis not present

## 2021-02-10 DIAGNOSIS — M170 Bilateral primary osteoarthritis of knee: Secondary | ICD-10-CM | POA: Insufficient documentation

## 2021-02-10 DIAGNOSIS — G8929 Other chronic pain: Secondary | ICD-10-CM

## 2021-02-10 NOTE — Assessment & Plan Note (Addendum)
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  Injection given today.  Could be a candidate for potential viscosupplementation.  Fairly severe in the patellofemoral and lateral compartment.  The patient would consider the possibility of replacement if needed.  Follow-up with me again in 4 to 8 weeks.

## 2021-02-10 NOTE — Assessment & Plan Note (Signed)
Patient given injection and tolerated the procedure well, discussed icing regimen and home exercise.  Discussed which activities to do which wants to avoid.  Patient wants to hold on any type of advanced imaging at this time.  Patient does state that the injection did help for some time.  Just feels like it is gotten worse again.

## 2021-02-10 NOTE — Patient Instructions (Addendum)
Xray today Injections today See me again in 4-6 weeks

## 2021-02-10 NOTE — Assessment & Plan Note (Signed)
Likely arthritic changes with some instability secondary to the hamstring.  At this point we will monitor.  X-rays ordered.  Worsening pain will consider injection but we will see how patient responds to the contralateral side.

## 2021-02-16 ENCOUNTER — Encounter: Payer: Self-pay | Admitting: Gastroenterology

## 2021-02-16 ENCOUNTER — Ambulatory Visit (INDEPENDENT_AMBULATORY_CARE_PROVIDER_SITE_OTHER): Payer: BC Managed Care – PPO | Admitting: Gastroenterology

## 2021-02-16 VITALS — BP 142/84 | HR 63 | Ht 77.0 in | Wt 248.1 lb

## 2021-02-16 DIAGNOSIS — Z1211 Encounter for screening for malignant neoplasm of colon: Secondary | ICD-10-CM | POA: Diagnosis not present

## 2021-02-16 NOTE — Patient Instructions (Addendum)
You have been scheduled for a colonoscopy. Please follow written instructions given to you at your visit today.  Please pick up your prep supplies at the pharmacy within the next 1-3 days. If you use inhalers (even only as needed), please bring them with you on the day of your procedure.   If you are age 59 or older, your body mass index should be between 23-30. Your Body mass index is 29.42 kg/m. If this is out of the aforementioned range listed, please consider follow up with your Primary Care Provider.  If you are age 65 or younger, your body mass index should be between 19-25. Your Body mass index is 29.42 kg/m. If this is out of the aformentioned range listed, please consider follow up with your Primary Care Provider.   ________________________________________________________  The [facility] providers would like to encourage you to use [facility] to communicate with providers for non-urgent requests or questions.  Due to long hold times on the telephone, sending your provider a message by [facility] may be a faster and more efficient way to get a response.  Please allow 48 business hours for a response.  Please remember that this is for non-urgent requests.  _______________________________________________________   Due to recent changes in healthcare laws, you may see the results of your imaging and laboratory studies on MyChart before your provider has had a chance to review them.  We understand that in some cases there may be results that are confusing or concerning to you. Not all laboratory results come back in the same time frame and the provider may be waiting for multiple results in order to interpret others.  Please give [place] 48 hours in order for your provider to thoroughly review all the results before contacting the office for clarification of your results.    I appreciate the  opportunity to care for you  Thank You   Marsa Aris , MD

## 2021-02-16 NOTE — Progress Notes (Signed)
Seth Morales    [id number]    [birth year]  Primary Care Physician:Pcp, No  Referring Physician: Jesse Sans, FNP [address] [place],  [place] [address]   Chief complaint: Discuss colonoscopy for colorectal cancer screening, chronic constipation  HPI: 59 year old very pleasant gentleman here to discuss colorectal cancer screening.  He is prior colonoscopy was suboptimal due to inadequate bowel prep.  He was recommended to repeat colonoscopy, was delayed during the pandemic. He has intermittent constipation, uses MiraLAX to have regular bowel movement.  Denies any rectal bleeding, diarrhea, melena, vomiting or abdominal pain.  He is s/p renal transplant  Colonoscopy November 21, 2017 - The perianal and digital rectal examinations were normal. - The transverse colon was significantly redundant. - A moderate amount of stool was found in the transverse colon and in the ascending colon, interfering with visualization. - Non-bleeding internal hemorrhoids were found during retroflexion. The hemorrhoids were large. - Scattered small and large-mouthed diverticula were found in the sigmoid colon and ascending colon.    Outpatient Encounter Medications as of 02/16/2021  Medication Sig   allopurinol (ZYLOPRIM) 100 MG tablet Take 100 mg by mouth daily.   Cholecalciferol (VITAMIN D) 125 MCG (5000 UT) CAPS Take 5,000 Units by mouth daily.    cycloSPORINE modified (NEORAL) 100 MG capsule Take 100 mg by mouth 2 (two) times daily.   cycloSPORINE modified (NEORAL) 25 MG capsule    doxycycline (MONODOX) 100 MG capsule Take 100 mg by mouth daily at 2 PM.   hydrochlorothiazide (MICROZIDE) 12.5 MG capsule Take 12.5 mg by mouth every other day.   magnesium oxide-pyridoxine (BEELITH) 362-20 MG TABS Take 1 tablet by mouth daily.   Multiple Vitamin (MULTI-VITAMIN) tablet Take by mouth.   mycophenolate (CELLCEPT) 250 MG capsule Take 750 mg by mouth 2 (two) times daily.    Omega-3 Fatty Acids (FISH OIL PO) Take by mouth.   polyethylene glycol (MIRALAX / GLYCOLAX) 17 g packet Take 17 g by mouth daily as needed.   predniSONE (DELTASONE) 5 MG tablet Take 1 tablet (5 mg total) by mouth daily with breakfast.   selenium sulfide (SELSUN) 2.5 % shampoo Apply topically as directed.   Sulfacetamide Sodium-Sulfur 8-4 % SUSP    tirzepatide (MOUNJARO) 2.5 MG/0.5ML Pen Inject 2.5 mg into the skin once a week.   No facility-administered encounter medications on file as of 02/16/2021.    Allergies as of 02/16/2021   (No Known Allergies)    Past Medical History:  Diagnosis Date   Allergy    Anemia    Back pain    Bilateral swelling of feet    Blood transfusion without reported diagnosis 13 years ago    Chronic kidney disease    Dermatitis    Hypertension    Joint pain    Kidney transplanted    Vitamin D deficiency     Past Surgical History:  Procedure Laterality Date   COLONOSCOPY  09/06/2012   at [place]   EYE SURGERY     INSERTION OF DIALYSIS CATHETER     KIDNEY TRANSPLANT  2006   KNEE ARTHROPLASTY Bilateral    NEPHRECTOMY Bilateral     Family History  Problem Relation Age of Onset   Arthritis Mother    Hypertension Mother    Kidney disease Mother    Diabetes Mother    Diabetes Father    Colon cancer Neg Hx  Colon polyps Neg Hx    Esophageal cancer Neg Hx    Rectal cancer Neg Hx    Stomach cancer Neg Hx    Pancreatic cancer Neg Hx     Social History   Socioeconomic History   Marital status: Married    Spouse name: Not on file   Number of children: Not on file   Years of education: Not on file   Highest education level: Not on file  Occupational History   Not on file  Tobacco Use   Smoking status: Never   Smokeless tobacco: Never  Vaping Use   Vaping Use: Never used  Substance and Sexual Activity   Alcohol use: Yes    Alcohol/week: 2.0 standard drinks    Types: 2 Standard drinks or equivalent per week   Drug use: No    Sexual activity: Not on file  Other Topics Concern   Not on file  Social History Narrative   Not on file   Social Determinants of Health   Financial Resource Strain: Not on file  Food Insecurity: Not on file  Transportation Needs: Not on file  Physical Activity: Not on file  Stress: Not on file  Social Connections: Not on file  Intimate Partner Violence: Not on file      Review of systems: All other review of systems negative except as mentioned in the HPI.   Physical Exam: Vitals:   02/16/21 1004  BP: (!) 142/84  Pulse: 63  SpO2: 100%   Body mass index is 29.42 kg/m. Gen:      No acute distress HEENT:  sclera anicteric Abd:      soft, non-tender; no palpable masses, no distension Ext:    No edema Neuro: alert and oriented x 3 Psych: normal mood and affect  Data Reviewed:  Reviewed labs, radiology imaging, old records and pertinent past GI work up   Assessment and Plan/Recommendations:  60 year old very pleasant gentleman here to discuss colonoscopy for colorectal cancer screening, inadequate bowel prep on prior colonoscopy.  Will plan to proceed with colonoscopy with extended bowel prep Please inform patient the results. Thanks  Chronic idiopathic constipation exacerbated by new medication: Increase dietary fiber and water intake Use Miralax 1 capful daily and titrate up as needed  The patient was provided an opportunity to ask questions and all were answered. The patient agreed with the plan and demonstrated an understanding of the instructions.  Iona Beard , MD    CC: [place], Thermon Leyland, FNP

## 2021-03-02 ENCOUNTER — Encounter: Payer: Self-pay | Admitting: Gastroenterology

## 2021-03-10 ENCOUNTER — Ambulatory Visit (INDEPENDENT_AMBULATORY_CARE_PROVIDER_SITE_OTHER): Payer: BC Managed Care – PPO | Admitting: Family Medicine

## 2021-03-10 ENCOUNTER — Other Ambulatory Visit: Payer: Self-pay

## 2021-03-10 ENCOUNTER — Encounter (INDEPENDENT_AMBULATORY_CARE_PROVIDER_SITE_OTHER): Payer: Self-pay | Admitting: Family Medicine

## 2021-03-10 ENCOUNTER — Encounter: Payer: Self-pay | Admitting: Gastroenterology

## 2021-03-10 VITALS — BP 136/84 | HR 69 | Temp 97.4°F | Ht 76.0 in | Wt 246.0 lb

## 2021-03-10 DIAGNOSIS — E669 Obesity, unspecified: Secondary | ICD-10-CM

## 2021-03-10 DIAGNOSIS — Z6833 Body mass index (BMI) 33.0-33.9, adult: Secondary | ICD-10-CM

## 2021-03-10 DIAGNOSIS — E8881 Metabolic syndrome: Secondary | ICD-10-CM

## 2021-03-10 DIAGNOSIS — K5909 Other constipation: Secondary | ICD-10-CM

## 2021-03-10 MED ORDER — TIRZEPATIDE 5 MG/0.5ML ~~LOC~~ SOAJ
5.0000 mg | SUBCUTANEOUS | 0 refills | Status: DC
Start: 2021-03-10 — End: 2021-04-07

## 2021-03-10 NOTE — Progress Notes (Signed)
Chief Complaint:   OBESITY Seth Morales is here to discuss his progress with his obesity treatment plan along with follow-up of his obesity related diagnoses. Seth Morales is on practicing portion control and making smarter food choices, such as increasing vegetables and decreasing simple carbohydrates and states he is following his eating plan approximately 90% of the time. Seth Morales states he is walking for 20-30 minutes 2 times per week.  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 He says his life will be calmer now. His protein intake is adequate. He notes he is a "carnivore" and protein intake is not an issue.  He plans on signing up for a gym membership.  Subjective:   1. Insulin resistance Seth Morales is taking Mounjaro 2.5 mg. He is not sure if it is helping as much as it once was..  Lab Results  Component Value Date   INSULIN 8.5 06/25/2020   INSULIN 6.0 01/20/2020   INSULIN 14.6 05/22/2019   Lab Results  Component Value Date   HGBA1C 5.5 06/25/2020    2. Other constipation Seth Morales notes constipation with Mounjaro. He manages this with Miralax but has not taken it this week.  He will have a colonoscopy January 9 and has already started some of the preliminary dietary changes of the prep.  Assessment/Plan:   1. Insulin resistance Seth Morales agrees to increase Mounjaro to 5 mg weekly.  Seth Morales agreed to follow-up with [place] as directed to closely monitor his progress.  - tirzepatide [facility]) 5 MG/0.5ML Pen; Inject 5 mg into the skin once a week.  Dispense: 6 mL; Refill: 0  2. Other constipation  Seth Morales will continue Miralax as needed and work on adequate intake of water and fiber.  3.  Overweight: Current BMI 29.96 Seth Morales is currently in the action stage of change. As such, his goal is to continue with weight loss efforts. He has agreed to practicing portion control and making smarter food choices, such as increasing vegetables and decreasing simple carbohydrates.   Exercise goals:  As is.  Behavioral modification strategies: increasing lean protein intake and decreasing simple carbohydrates.  Seth Morales has agreed to follow-up with our clinic in 4 weeks (fasting).  Objective:   Blood pressure 136/84, pulse 69, temperature (!) 97.4 F (36.3 C), height 6\' 4"  (1.93 m), weight 246 lb (111.6 kg), SpO2 99 %. Body mass index is 29.94 kg/m.  General: Cooperative, alert, well developed, in no acute distress. HEENT: Conjunctivae and lids unremarkable. Cardiovascular: Regular rhythm.  Lungs: Normal work of breathing. Neurologic: No focal deficits.   Lab Results  Component Value Date   CREATININE 1.20 06/25/2020   BUN 21 06/25/2020   NA 143 06/25/2020   K 3.9 06/25/2020   CL 102 06/25/2020   CO2 25 06/25/2020   Lab Results  Component Value Date   ALT 26 06/25/2020   AST 23 06/25/2020   ALKPHOS 70 06/25/2020   BILITOT 0.8 06/25/2020   Lab Results  Component Value Date   HGBA1C 5.5 06/25/2020   HGBA1C 5.4 05/22/2019   HGBA1C 5.4 11/30/2018   HGBA1C 5.6 07/12/2017   HGBA1C 5.2 11/07/2014   Lab Results  Component Value Date   INSULIN 8.5 06/25/2020  INSULIN 6.0 01/20/2020   INSULIN 14.6 05/22/2019   Lab Results  Component Value Date   TSH 2.970 05/22/2019   Lab Results  Component Value Date   CHOL 180 06/25/2020   HDL 51 06/25/2020   LDLCALC 109 (H) 06/25/2020   LDLDIRECT 114.0 03/14/2019   TRIG 113 06/25/2020   CHOLHDL 3.4 01/20/2020   Lab Results  Component Value Date   VD25OH 61.9 06/25/2020   VD25OH 96.6 01/20/2020   VD25OH 56.28 03/14/2019   Lab Results  Component Value Date   WBC 6.4 01/20/2020   HGB 14.8 01/20/2020   HCT 45.5  01/20/2020   MCV 93 01/20/2020   PLT 177 01/20/2020   No results found for: IRON, TIBC, FERRITIN  Attestation Statements:   Reviewed by clinician on day of visit: allergies, medications, problem list, medical history, surgical history, family history, social history, and previous encounter notes.  I, Lizbeth Bark, RMA, am acting as Location manager for [employer], [place].  I have reviewed the above documentation for accuracy and completeness, and I agree with the above. -  Georgianne Fick, FNP

## 2021-03-15 ENCOUNTER — Encounter: Payer: Self-pay | Admitting: Gastroenterology

## 2021-03-15 ENCOUNTER — Other Ambulatory Visit: Payer: Self-pay

## 2021-03-15 ENCOUNTER — Ambulatory Visit (AMBULATORY_SURGERY_CENTER): Payer: BC Managed Care – PPO | Admitting: Gastroenterology

## 2021-03-15 VITALS — BP 134/86 | HR 60 | Temp 98.1°F | Resp 13 | Ht 77.0 in | Wt 248.0 lb

## 2021-03-15 DIAGNOSIS — Z1211 Encounter for screening for malignant neoplasm of colon: Secondary | ICD-10-CM

## 2021-03-15 MED ORDER — FLEET ENEMA 7-19 GM/118ML RE ENEM
1.0000 | ENEMA | Freq: Once | RECTAL | Status: AC
  Administered 2021-03-15: 1 via RECTAL

## 2021-03-15 MED ORDER — SODIUM CHLORIDE 0.9 % IV SOLN: 500.0000 mL | Freq: Once | INTRAVENOUS | Status: DC

## 2021-03-15 NOTE — Progress Notes (Signed)
[facility] [address] [place] [address] Phone: [phone] Subjective:   Seth Morales, am serving as a scribe for Dr. Antoine Primas. This visit occurred during the SARS-CoV-2 public health emergency.  Safety protocols were in place, including screening questions prior to the visit, additional usage of staff PPE, and extensive cleaning of exam room while observing appropriate contact time as indicated for disinfecting solutions.   I'm seeing this patient by the request  of:  Pcp, No  CC: Bilateral knee and shoulder pain  KDT:[id number]  02/10/2021  Update 03/17/2021 Seth Morales is a 61 y.o. male coming in with complaint of B knee and R shoulder pain. Patient states bilateral knee pain more right than the left. Injections in R knee and shoulder did help for a little but not much. Pain is about the same since last appointment. Hand pain OMT helped. Wants it to be done again.       Past Medical History:  Diagnosis Date   Allergy    Anemia    Back pain    Bilateral swelling of feet    Blood transfusion without reported diagnosis 13 years ago    Chronic kidney disease    Dermatitis    Hypertension    Joint pain    Kidney transplanted    Vitamin D deficiency    Past Surgical History:  Procedure Laterality Date   COLONOSCOPY  09/06/2012   at [place]   EYE SURGERY     INSERTION OF DIALYSIS CATHETER     KIDNEY TRANSPLANT  2006   KNEE ARTHROPLASTY Bilateral    NEPHRECTOMY Bilateral    Social History   Socioeconomic History   Marital status: Married    Spouse name: Not on file   Number of children: Not on file   Years of education: Not on file   Highest education level: Not on file  Occupational History   Not on file  Tobacco Use   Smoking status: Never   Smokeless tobacco: Never  Vaping Use   Vaping Use: Never used  Substance and Sexual Activity   Alcohol use: Yes    Alcohol/week: 2.0 standard drinks    Types: 2  Standard drinks or equivalent per week   Drug use: No   Sexual activity: Not on file  Other Topics Concern   Not on file  Social History Narrative   Not on file   Social Determinants of Health   Financial Resource Strain: Not on file  Food Insecurity: Not on file  Transportation Needs: Not on file  Physical Activity: Not on file  Stress: Not on file  Social Connections: Not on file   No Known Allergies Family History  Problem Relation Age of Onset   Arthritis Mother    Hypertension Mother    Kidney disease Mother    Diabetes Mother    Diabetes Father    Colon cancer Neg Hx    Colon polyps Neg Hx    Esophageal cancer Neg Hx    Rectal cancer Neg Hx    Stomach cancer Neg Hx    Pancreatic cancer Neg Hx     Current Outpatient Medications (Endocrine & Metabolic):    predniSONE (DELTASONE) 5 MG tablet, Take 1 tablet (5 mg total) by mouth daily with breakfast.   tirzepatide (MOUNJARO) 5 MG/0.5ML Pen, Inject 5 mg into the skin once a week.  Current Outpatient Medications (Cardiovascular):    hydrochlorothiazide (MICROZIDE) 12.5 MG capsule,  Take 12.5 mg by mouth every other day.   Current Outpatient Medications (Analgesics):    allopurinol (ZYLOPRIM) 100 MG tablet, Take 100 mg by mouth daily.   Current Outpatient Medications (Other):    Cholecalciferol (VITAMIN D) 125 MCG (5000 UT) CAPS, Take 5,000 Units by mouth daily.    cycloSPORINE modified (NEORAL) 100 MG capsule, Take 100 mg by mouth 2 (two) times daily.   cycloSPORINE modified (NEORAL) 25 MG capsule,    doxycycline (MONODOX) 100 MG capsule, Take 100 mg by mouth daily at 2 PM.   magnesium oxide-pyridoxine (BEELITH) 362-20 MG TABS, Take 1 tablet by mouth daily.   Multiple Vitamin (MULTI-VITAMIN) tablet, Take by mouth.   mycophenolate (CELLCEPT) 250 MG capsule, Take 750 mg by mouth 2 (two) times daily.   Omega-3 Fatty Acids (FISH OIL PO), Take by mouth.   polyethylene glycol (MIRALAX / GLYCOLAX) 17 g packet, Take 17 g  by mouth daily as needed.   selenium sulfide (SELSUN) 2.5 % shampoo, Apply topically as directed.   Sulfacetamide Sodium-Sulfur 8-4 % SUSP,    Reviewed prior external information including notes and imaging from  primary care provider As well as notes that were available from care everywhere and other healthcare systems.  Past medical history, social, surgical and family history all reviewed in electronic medical record.  No pertanent information unless stated regarding to the chief complaint.   Review of Systems:  No headache, visual changes, nausea, vomiting, diarrhea, constipation, dizziness, abdominal pain, skin rash, fevers, chills, night sweats, weight loss, swollen lymph nodes, body aches, joint swelling, chest pain, shortness of breath, mood changes. POSITIVE muscle aches  Objective  Blood pressure 122/78, pulse 64, height 6\' 5"  (1.956 m), weight 249 lb (112.9 kg), SpO2 98 %.   General: No apparent distress alert and oriented x3 mood and affect normal, dressed appropriately.  HEENT: Pupils equal, extraocular movements intact  Respiratory: Patient's speak in full sentences and does not appear short of breath  Cardiovascular: No lower extremity edema, non tender, no erythema  Gait normal with good balance and coordination.  MSK: Patient noted does have some tenderness to palpation over the greater trochanteric area on the left side.  Patient does have the arthritic changes of the right knee noted with effusion noted and instability.   Procedure: Real-time Ultrasound Guided Injection of left  greater trochanteric bursitis secondary to patient's body habitus Device: GE Logiq Q7  Ultrasound guided injection is preferred based studies that show increased duration, increased effect, greater accuracy, decreased procedural pain, increased response rate, and decreased cost with ultrasound guided versus blind injection.  Verbal informed consent obtained.  Time-out conducted.  Noted no  overlying erythema, induration, or other signs of local infection.  Skin prepped in a sterile fashion.  Local anesthesia: Topical Ethyl chloride.  With sterile technique and under real time ultrasound guidance:  Greater trochanteric area was visualized and patient's bursa was noted. A 22-gauge 3 inch needle was inserted and 2 cc of 0.5% Marcaine and 1 cc of Kenalog 40 mg/dL was injected. Pictures taken Completed without difficulty  Pain immediately resolved suggesting accurate placement of the medication.  Advised to call if fevers/chills, erythema, induration, drainage, or persistent bleeding.  Impression: Technically successful ultrasound guided injection.    Impression and Recommendations:     The above documentation has been reviewed and is accurate and complete , DO

## 2021-03-15 NOTE — Progress Notes (Signed)
Vss nad transferred to pacu 

## 2021-03-15 NOTE — Progress Notes (Signed)
Please refer to office visit note 02/16/21. No additional changes in H&P Patient is appropriate for planned procedure(s) and anesthesia in an ambulatory setting  K. Scherry Ran , MD [phone]    Pt had an enema and is now a yellow tinged stool.  Adm completed.

## 2021-03-15 NOTE — Op Note (Signed)
[facility] Patient Name: Seth Morales Procedure Date: 03/15/2021 2:56 PM MRN: [id number] Endoscopist: Mauri Pole , MD Age: 61 Referring MD:  Date of Birth: 1961-02-01 Gender: Male Account #: [id number] Procedure:                Colonoscopy Indications:              Screening for colorectal malignant neoplasm Medicines:                Monitored Anesthesia Care Procedure:                Pre-Anesthesia Assessment:                           - Prior to the procedure, a History and Physical                            was performed, and patient medications and                            allergies were reviewed. The patient's tolerance of                            previous anesthesia was also reviewed. The risks                            and benefits of the procedure and the sedation                            options and risks were discussed with the patient.                            All questions were answered, and informed consent                            was obtained. Prior Anticoagulants: The patient has                            taken no previous anticoagulant or antiplatelet                            agents. ASA Grade Assessment: III - A patient with                            severe systemic disease. After reviewing the risks                            and benefits, the patient was deemed in                            satisfactory condition to undergo the procedure.                           After obtaining informed consent, the colonoscope  was passed under direct vision. Throughout the                            procedure, the patient's blood pressure, pulse, and                            oxygen saturations were monitored continuously. The                            Olympus Colonoscope [id number] was introduced through                            the anus and advanced to the the cecum, identified                            by  appendiceal orifice and ileocecal valve. The                            colonoscopy was somewhat difficult due to a                            tortuous colon and the patient's body habitus.                            Successful completion of the procedure was aided by                            applying abdominal pressure. The patient tolerated                            the procedure well. The quality of the bowel                            preparation was adequate. The ileocecal valve,                            appendiceal orifice, and rectum were photographed. Scope In: 3:21:07 PM Scope Out: 3:46:51 PM Scope Withdrawal Time: 0 hours 11 minutes 1 second  Total Procedure Duration: 0 hours 25 minutes 44 seconds  Findings:                 The perianal and digital rectal examinations were                            normal.                           A few small and large-mouthed diverticula were                            found in the sigmoid colon and ascending colon.                           Non-bleeding external and internal hemorrhoids were  found during retroflexion. The hemorrhoids were                            small.                           The exam was otherwise without abnormality. Complications:            No immediate complications. Estimated Blood Loss:     Estimated blood loss was minimal. Impression:               - Diverticulosis in the sigmoid colon and in the                            ascending colon.                           - Non-bleeding external and internal hemorrhoids.                           - The examination was otherwise normal.                           - No specimens collected. Recommendation:           - Patient has a contact number available for                            emergencies. The signs and symptoms of potential                            delayed complications were discussed with the                             patient. Return to normal activities tomorrow.                            Written discharge instructions were provided to the                            patient.                           - Resume previous diet.                           - Continue present medications.                           - Repeat colonoscopy in 10 years for screening                            purposes.                           - For future colonoscopy the patient will require  an extended preparation. If there are any                            questions, please contact the gastroenterologist. Mauri Pole, MD 03/15/2021 3:51:10 PM This report has been signed electronically.

## 2021-03-15 NOTE — Patient Instructions (Signed)
Thank you for allowing [place] to care for you today! Resume previous diet and medications today. Return to normal daily activities tomorrow. Recommend next screening colonoscopy in 10 years.  Please contact us if any issues or concerns should arise.      YOU HAD AN ENDOSCOPIC PROCEDURE TODAY AT THE [facility]:   Refer to the procedure report that was given to you for any specific questions about what was found during the examination.  If the procedure report does not answer your questions, please call your gastroenterologist to clarify.  If you requested that your care partner not be given the details of your procedure findings, then the procedure report has been included in a sealed envelope for you to review at your convenience later.  YOU SHOULD EXPECT: Some feelings of bloating in the abdomen. Passage of more gas than usual.  Walking can help get rid of the air that was put into your GI tract during the procedure and reduce the bloating. If you had a lower endoscopy (such as a colonoscopy or flexible sigmoidoscopy) you may notice spotting of blood in your stool or on the toilet paper. If you underwent a bowel prep for your procedure, you may not have a normal bowel movement for a few days.  Please Note:  You might notice some irritation and congestion in your nose or some drainage.  This is from the oxygen used during your procedure.  There is no need for concern and it should clear up in a day or so.  SYMPTOMS TO REPORT IMMEDIATELY:  Following lower endoscopy (colonoscopy or flexible sigmoidoscopy):  Excessive amounts of blood in the stool  Significant tenderness or worsening of abdominal pains  Swelling of the abdomen that is new, acute  Fever of 100F or higher    For urgent or emergent issues, a gastroenterologist can be reached at any hour by calling [phone]. Do not use MyChart messaging for urgent concerns.    DIET:  We do recommend a small meal at first, but  then you may proceed to your regular diet.  Drink plenty of fluids but you should avoid alcoholic beverages for 24 hours.  ACTIVITY:  You should plan to take it easy for the rest of today and you should NOT DRIVE or use heavy machinery until tomorrow (because of the sedation medicines used during the test).    FOLLOW UP: Our staff will call the number listed on your records 48-72 hours following your procedure to check on you and address any questions or concerns that you may have regarding the information given to you following your procedure. If we do not reach you, we will leave a message.  We will attempt to reach you two times.  During this call, we will ask if you have developed any symptoms of COVID 19. If you develop any symptoms (ie: fever, flu-like symptoms, shortness of breath, cough etc.) before then, please call [phone].  If you test positive for Covid 19 in the 2 weeks post procedure, please call and report this information to [place].    If any biopsies were taken you will be contacted by phone or by letter within the next 1-3 weeks.  Please call us at [phone] if you have not heard about the biopsies in 3 weeks.    SIGNATURES/CONFIDENTIALITY: You and/or your care partner have signed paperwork which will be entered into your electronic medical record.  These signatures attest to the fact that that the information above  on your After Visit Summary has been reviewed and is understood.  Full responsibility of the confidentiality of this discharge information lies with you and/or your care-partner.

## 2021-03-17 ENCOUNTER — Encounter: Payer: Self-pay | Admitting: Family Medicine

## 2021-03-17 ENCOUNTER — Other Ambulatory Visit: Payer: Self-pay

## 2021-03-17 ENCOUNTER — Ambulatory Visit: Payer: Self-pay

## 2021-03-17 ENCOUNTER — Ambulatory Visit: Payer: BC Managed Care – PPO | Admitting: Family Medicine

## 2021-03-17 ENCOUNTER — Telehealth: Payer: Self-pay | Admitting: *Deleted

## 2021-03-17 VITALS — BP 122/78 | HR 64 | Ht 77.0 in | Wt 249.0 lb

## 2021-03-17 DIAGNOSIS — S63092A Other subluxation of left wrist and hand, initial encounter: Secondary | ICD-10-CM | POA: Diagnosis not present

## 2021-03-17 DIAGNOSIS — M19011 Primary osteoarthritis, right shoulder: Secondary | ICD-10-CM | POA: Diagnosis not present

## 2021-03-17 DIAGNOSIS — M7062 Trochanteric bursitis, left hip: Secondary | ICD-10-CM | POA: Diagnosis not present

## 2021-03-17 DIAGNOSIS — M7061 Trochanteric bursitis, right hip: Secondary | ICD-10-CM | POA: Insufficient documentation

## 2021-03-17 NOTE — Telephone Encounter (Signed)
°  Follow up Call-  Call back number 03/15/2021  Post procedure Call Back phone  # [phone]  Permission to leave phone message Yes  Some recent data might be hidden     Patient questions:  Do you have a fever, pain , or abdominal swelling? No. Pain Score  0 *  Have you tolerated food without any problems? Yes.    Have you been able to return to your normal activities? Yes.    Do you have any questions about your discharge instructions: Diet   No. Medications  No. Follow up visit  No.  Do you have questions or concerns about your Care? No.  Actions: * If pain score is 4 or above: No action needed, pain <4.  Have you developed a fever since your procedure? no  2.   Have you had an respiratory symptoms (SOB or cough) since your procedure? no  3.   Have you tested positive for COVID 19 since your procedure no  4.   Have you had any family members/close contacts diagnosed with the COVID 19 since your procedure?  no   If yes to any of these questions please route to Laverna Peace, RN and Karlton Lemon, RN

## 2021-03-17 NOTE — Assessment & Plan Note (Signed)
Manual manipulation done.  Responded extremely well.  Attempted a little bit in the scapular region as well.  Discussed icing regimen and home exercises.  Follow-up again in 6 to 8 weeks

## 2021-03-17 NOTE — Patient Instructions (Signed)
Injection Stay active See you again in 6 weeks

## 2021-03-17 NOTE — Assessment & Plan Note (Signed)
Patient given injection and tolerated the procedure well, discussed icing regimen and home exercises, discussed which activities to do which wants to avoid.  Increase activity slowly.  Follow-up again in 6 to 8 weeks.

## 2021-03-23 ENCOUNTER — Telehealth: Payer: Self-pay

## 2021-03-23 NOTE — Telephone Encounter (Signed)
Left message for patient to call back to schedule 3 more visits, once a week following his 2/22 appt for the series of 4 orthovisc injections.

## 2021-03-24 NOTE — Telephone Encounter (Signed)
Left message for patient to call back to schedule following visits for gel injections.

## 2021-04-07 ENCOUNTER — Ambulatory Visit (INDEPENDENT_AMBULATORY_CARE_PROVIDER_SITE_OTHER): Payer: BC Managed Care – PPO | Admitting: Family Medicine

## 2021-04-07 ENCOUNTER — Other Ambulatory Visit: Payer: Self-pay

## 2021-04-07 ENCOUNTER — Encounter (INDEPENDENT_AMBULATORY_CARE_PROVIDER_SITE_OTHER): Payer: Self-pay | Admitting: Family Medicine

## 2021-04-07 VITALS — BP 143/80 | HR 72 | Temp 97.7°F | Ht 76.0 in | Wt 244.0 lb

## 2021-04-07 DIAGNOSIS — E8881 Metabolic syndrome: Secondary | ICD-10-CM

## 2021-04-07 DIAGNOSIS — E669 Obesity, unspecified: Secondary | ICD-10-CM

## 2021-04-07 DIAGNOSIS — K219 Gastro-esophageal reflux disease without esophagitis: Secondary | ICD-10-CM

## 2021-04-07 DIAGNOSIS — Z6829 Body mass index (BMI) 29.0-29.9, adult: Secondary | ICD-10-CM | POA: Diagnosis not present

## 2021-04-07 DIAGNOSIS — E88819 Insulin resistance, unspecified: Secondary | ICD-10-CM

## 2021-04-07 MED ORDER — TIRZEPATIDE 5 MG/0.5ML ~~LOC~~ SOAJ: 5.0000 mg | SUBCUTANEOUS | 0 refills | Status: DC

## 2021-04-07 NOTE — Progress Notes (Signed)
Chief Complaint:   OBESITY Seth Morales is here to discuss his progress with his obesity treatment plan along with follow-up of his obesity related diagnoses. Seth Morales is on practicing portion control and making smarter food choices, such as increasing vegetables and decreasing simple carbohydrates and states he is following his eating plan approximately 90% of the time. Seth Morales states he is doing gym exercise and walking the dog for 60 minutes 6 times per week.  Today's visit was #: 24 Starting weight: 285 lbs Starting date: 05/22/2019 Today's weight: 244 lbs Today's date: 04/07/2021 Total lbs lost to date: 41 lbs Total lbs lost since last in-office visit: 2 lbs  Interim History: Seth Morales has started working out at [employer] doing cardio and weights. He feels he does a good job with protein intake. He would like to lose down to 230 lbs. He is surprised with weight loss today. He has traveled for work recently which lead to unhealthy eating.  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  Subjective:   1. Insulin resistance Seth Morales is on Mounjaro 5 mg currently. He notes the 5 mg is helping quite a bit with appetite. He has had some reflux with the [facility].   Lab Results  Component Value Date   HGBA1C 5.5 06/25/2020   Lab Results  Component Value Date   INSULIN 8.5 06/25/2020   INSULIN 6.0 01/20/2020   INSULIN 14.6 05/22/2019    2. Gastroesophageal reflux disease, unspecified whether esophagitis present Seth Morales noted some refulx with Mounjaro.  Assessment/Plan:   1. Insulin resistance We will refill Mounjaro 5 mg.   - tirzepatide (MOUNJARO) 5 MG/0.5ML Pen; Inject 5 mg into the skin once a week.  Dispense: 6 mL; Refill: 0  2. Gastroesophageal reflux disease, unspecified whether esophagitis present  Seth Morales may use over the counter Pepcid or Tums as needed per package directions.  3. Obesity: Current BMI 29.71 Seth Morales is  currently in the action stage of change. As such, his goal is to continue with weight loss efforts. He has agreed to practicing portion control and making smarter food choices, such as increasing vegetables and decreasing simple carbohydrates.   Exercise goals:  As is.  Behavioral modification strategies: decreasing simple carbohydrates and decreasing eating out.  Seth Morales has agreed to follow-up with our clinic in 3-4 weeks (fasting). He was informed of the importance of frequent follow-up visits to maximize his success with intensive lifestyle modifications for his multiple health conditions.   Objective:   Blood pressure (!) 143/80, pulse 72, temperature 97.7 F (36.5 C), height 6\' 4"  (1.93 m), weight 244 lb (110.7 kg), SpO2 98 %. Body mass index is 29.7 kg/m.  General: Cooperative, alert, well developed, in no acute distress. HEENT: Conjunctivae and lids unremarkable. Cardiovascular: Regular rhythm.  Lungs: Normal work of breathing. Neurologic: No focal deficits.   Lab Results  Component Value Date   CREATININE 1.20 06/25/2020   BUN 21 06/25/2020   NA 143 06/25/2020   K 3.9 06/25/2020   CL 102 06/25/2020   CO2 25 06/25/2020   Lab Results  Component Value Date   ALT 26 06/25/2020   AST 23 06/25/2020   ALKPHOS 70 06/25/2020   BILITOT 0.8 06/25/2020   Lab Results  Component Value Date   HGBA1C 5.5 06/25/2020   HGBA1C 5.4 05/22/2019   HGBA1C 5.4 11/30/2018   HGBA1C 5.6 07/12/2017   HGBA1C 5.2 11/07/2014   Lab Results  Component Value Date   INSULIN 8.5 06/25/2020   INSULIN 6.0 01/20/2020   INSULIN 14.6 05/22/2019   Lab Results  Component Value Date   TSH 2.970 05/22/2019   Lab Results  Component Value Date   CHOL 180 06/25/2020   HDL 51 06/25/2020   LDLCALC 109 (H) 06/25/2020   LDLDIRECT 114.0 03/14/2019   TRIG 113 06/25/2020   CHOLHDL 3.4 01/20/2020   Lab Results  Component Value Date   VD25OH 61.9 06/25/2020   VD25OH 96.6 01/20/2020   VD25OH 56.28  03/14/2019   Lab Results  Component Value Date   WBC 6.4 01/20/2020   HGB 14.8 01/20/2020   HCT 45.5 01/20/2020   MCV 93 01/20/2020   PLT 177 01/20/2020   No results found for: IRON, TIBC, FERRITIN  Attestation Statements:   Reviewed by clinician on day of visit: allergies, medications, problem list, medical history, surgical history, family history, social history, and previous encounter notes.  I, Lizbeth Bark, RMA, am acting as Location manager for [employer], [place].  I have reviewed the above documentation for accuracy and completeness, and I agree with the above. -  Georgianne Fick, FNP

## 2021-04-16 ENCOUNTER — Ambulatory Visit: Payer: BC Managed Care – PPO | Admitting: Family Medicine

## 2021-04-21 ENCOUNTER — Ambulatory Visit: Payer: BC Managed Care – PPO | Admitting: Family Medicine

## 2021-04-27 NOTE — Progress Notes (Signed)
[name] [address] [place] [address] Phone: [phone] Subjective:   [name], am serving as a scribe for Dr. Antoine Primas.This visit occurred during the SARS-CoV-2 public health emergency.  Safety protocols were in place, including screening questions prior to the visit, additional usage of staff PPE, and extensive cleaning of exam room while observing appropriate contact time as indicated for disinfecting solutions.   I'm seeing this patient by the request  of:  Pcp, No  CC: Knee pain follow-up  [id number]  03/17/2021 Patient given injection and tolerated the procedure well, discussed icing regimen and home exercises, discussed which activities to do which wants to avoid.  Increase activity slowly.  Follow-up again in 6 to 8 weeks.  Manual manipulation done.  Responded extremely well.  Attempted a little bit in the scapular region as well.  Discussed icing regimen and home exercises.  Follow-up again in 6 to 8 weeks  Update 04/28/2021 Seth Morales is a 61 y.o. male coming in with complaint of R shoulder, R knee and L hip pain. Patient states that he has been going to the gym. Patient does have pain with knee extension over anterior portion of knee. L hip and R shoulder have improved.        Past Medical History:  Diagnosis Date   Allergy    Anemia    Back pain    Bilateral swelling of feet    Blood transfusion without reported diagnosis 13 years ago    Chronic kidney disease    Dermatitis    Hypertension    Joint pain    Kidney transplanted    Vitamin D deficiency    Past Surgical History:  Procedure Laterality Date   COLONOSCOPY  09/06/2012   at [place]   EYE SURGERY     INSERTION OF DIALYSIS CATHETER     KIDNEY TRANSPLANT  2006   KNEE ARTHROPLASTY Bilateral    NEPHRECTOMY Bilateral    Social History   Socioeconomic History   Marital status: Married    Spouse name: Not on file   Number of  children: Not on file   Years of education: Not on file   Highest education level: Not on file  Occupational History   Not on file  Tobacco Use   Smoking status: Never   Smokeless tobacco: Never  Vaping Use   Vaping Use: Never used  Substance and Sexual Activity   Alcohol use: Yes    Alcohol/week: 2.0 standard drinks    Types: 2 Standard drinks or equivalent per week   Drug use: No   Sexual activity: Not on file  Other Topics Concern   Not on file  Social History Narrative   Not on file   Social Determinants of Health   Financial Resource Strain: Not on file  Food Insecurity: Not on file  Transportation Needs: Not on file  Physical Activity: Not on file  Stress: Not on file  Social Connections: Not on file   No Known Allergies Family History  Problem Relation Age of Onset   Arthritis Mother    Hypertension Mother    Kidney disease Mother    Diabetes Mother    Diabetes Father    Colon cancer Neg Hx    Colon polyps Neg Hx    Esophageal cancer Neg Hx    Rectal cancer Neg Hx    Stomach cancer Neg Hx    Pancreatic cancer Neg Hx  Current Outpatient Medications (Endocrine & Metabolic):    predniSONE (DELTASONE) 5 MG tablet, Take 1 tablet (5 mg total) by mouth daily with breakfast.   tirzepatide (MOUNJARO) 5 MG/0.5ML Pen, Inject 5 mg into the skin once a week.  Current Outpatient Medications (Cardiovascular):    hydrochlorothiazide (MICROZIDE) 12.5 MG capsule, Take 12.5 mg by mouth every other day.   Current Outpatient Medications (Analgesics):    allopurinol (ZYLOPRIM) 100 MG tablet, Take 100 mg by mouth daily.   Current Outpatient Medications (Other):    Cholecalciferol (VITAMIN D) 125 MCG (5000 UT) CAPS, Take 5,000 Units by mouth daily.    cycloSPORINE modified (NEORAL) 100 MG capsule, Take 100 mg by mouth 2 (two) times daily.   cycloSPORINE modified (NEORAL) 25 MG capsule,    doxycycline (MONODOX) 100 MG capsule, Take 100 mg by mouth daily at 2 PM.    magnesium oxide-pyridoxine (BEELITH) 362-20 MG TABS, Take 1 tablet by mouth daily.   Multiple Vitamin (MULTI-VITAMIN) tablet, Take by mouth.   mycophenolate (CELLCEPT) 250 MG capsule, Take 750 mg by mouth 2 (two) times daily.   Omega-3 Fatty Acids (FISH OIL PO), Take by mouth.   polyethylene glycol (MIRALAX / GLYCOLAX) 17 g packet, Take 17 g by mouth daily as needed.   selenium sulfide (SELSUN) 2.5 % shampoo, Apply topically as directed.   Sulfacetamide Sodium-Sulfur 8-4 % SUSP,     Review of Systems:  No headache, visual changes, nausea, vomiting, diarrhea, constipation, dizziness, abdominal pain, skin rash, fevers, chills, night sweats, weight loss, swollen lymph nodes, body aches,  chest pain, shortness of breath, mood changes. POSITIVE muscle aches, joint swelling  Objective  Blood pressure 128/82, pulse 73, height 6\' 4"  (1.93 m), weight 248 lb (112.5 kg).   General: No apparent distress alert and oriented x3 mood and affect normal, dressed appropriately.  HEENT: Pupils equal, extraocular movements intact  Respiratory: Patient's speak in full sentences and does not appear short of breath  antalgic Right knee does have significant arthritic changes.  Seems to have limited range of motion in certain movements.  Patient does have tenderness to palpation over the medial joint line.  Instability with valgus and varus force, patient does have an effusion noted   After informed written and verbal consent, patient was seated on exam table. Right knee was prepped with alcohol swab and utilizing anterolateral approach, patient's right knee space was injected with15 mg/2.5 mL of Orthovisc(sodium hyaluronate) in a prefilled syringe was injected easily into the knee through a 22-gauge needle..Patient tolerated the procedure well without immediate complications. Impression and Recommendations:    The above documentation has been reviewed and is accurate and complete , DO

## 2021-04-28 ENCOUNTER — Other Ambulatory Visit: Payer: Self-pay

## 2021-04-28 ENCOUNTER — Ambulatory Visit (INDEPENDENT_AMBULATORY_CARE_PROVIDER_SITE_OTHER): Payer: BC Managed Care – PPO | Admitting: Family Medicine

## 2021-04-28 ENCOUNTER — Ambulatory Visit: Payer: Self-pay

## 2021-04-28 VITALS — BP 128/82 | HR 73 | Ht 76.0 in | Wt 248.0 lb

## 2021-04-28 DIAGNOSIS — M25511 Pain in right shoulder: Secondary | ICD-10-CM

## 2021-04-28 DIAGNOSIS — M1711 Unilateral primary osteoarthritis, right knee: Secondary | ICD-10-CM

## 2021-04-28 NOTE — Assessment & Plan Note (Signed)
Chronic problem with exacerbation.  Failed all other conservative therapy.  Patient does want to respond well to this and hopefully he will.  Has responded previously multiple years ago.  Discussed with patient about icing regimen and home exercises.  Follow-up with me again in 1 week for second in a series of 4 injections with the viscosupplementation.

## 2021-05-04 ENCOUNTER — Other Ambulatory Visit: Payer: Self-pay

## 2021-05-04 ENCOUNTER — Ambulatory Visit (INDEPENDENT_AMBULATORY_CARE_PROVIDER_SITE_OTHER): Payer: BC Managed Care – PPO | Admitting: Adult Health

## 2021-05-04 ENCOUNTER — Encounter (INDEPENDENT_AMBULATORY_CARE_PROVIDER_SITE_OTHER): Payer: Self-pay | Admitting: Adult Health

## 2021-05-04 VITALS — BP 144/78 | HR 64 | Temp 98.0°F | Ht 77.0 in | Wt 249.0 lb

## 2021-05-04 DIAGNOSIS — E785 Hyperlipidemia, unspecified: Secondary | ICD-10-CM

## 2021-05-04 DIAGNOSIS — E559 Vitamin D deficiency, unspecified: Secondary | ICD-10-CM | POA: Diagnosis not present

## 2021-05-04 DIAGNOSIS — E8881 Metabolic syndrome: Secondary | ICD-10-CM

## 2021-05-04 DIAGNOSIS — Z9189 Other specified personal risk factors, not elsewhere classified: Secondary | ICD-10-CM

## 2021-05-04 DIAGNOSIS — E669 Obesity, unspecified: Secondary | ICD-10-CM

## 2021-05-04 DIAGNOSIS — I10 Essential (primary) hypertension: Secondary | ICD-10-CM

## 2021-05-04 DIAGNOSIS — Z6829 Body mass index (BMI) 29.0-29.9, adult: Secondary | ICD-10-CM

## 2021-05-04 MED ORDER — TIRZEPATIDE 5 MG/0.5ML ~~LOC~~ SOAJ: 5.0000 mg | SUBCUTANEOUS | 0 refills | Status: DC

## 2021-05-04 NOTE — Progress Notes (Signed)
?Seth Morales D.O. ?[facility] ?[address] [place] [address] ?Phone: [phone] ?Subjective:   ?I, Seth Morales, am serving as a scribe for Dr. Antoine Primas. ? ?This visit occurred during the SARS-CoV-2 public health emergency.  Safety protocols were in place, including screening questions prior to the visit, additional usage of staff PPE, and extensive cleaning of exam room while observing appropriate contact time as indicated for disinfecting solutions.  ? ?I'm seeing this patient by the request  of:  Pcp, No ? ?CC: Right knee pain follow-up ? ?[id number]  ?04/28/2021 ?Chronic problem with exacerbation.  Failed all other conservative therapy.  Patient does want to respond well to this and hopefully he will.  Has responded previously multiple years ago.  Discussed with patient about icing regimen and home exercises.  Follow-up with me again in 1 week for second in a series of 4 injections with the viscosupplementation. ? ?Update 05/05/2021 ?Seth Morales is a 61 y.o. male coming in with complaint of R knee pain. Patient states that he had some soreness after last injection. Pain surrounding patella for a few days. ? ? ? ?  ? ?Past Medical History:  ?Diagnosis Date  ? Allergy   ? Anemia   ? Back pain   ? Bilateral swelling of feet   ? Blood transfusion without reported diagnosis 13 years ago   ? Chronic kidney disease   ? Dermatitis   ? Hypertension   ? Joint pain   ? Kidney transplanted   ? Vitamin D deficiency   ? ?Past Surgical History:  ?Procedure Laterality Date  ? COLONOSCOPY  09/06/2012  ? at [facility]  ? EYE SURGERY    ? INSERTION OF DIALYSIS CATHETER    ? KIDNEY TRANSPLANT  2006  ? KNEE ARTHROPLASTY Bilateral   ? NEPHRECTOMY Bilateral   ? ?Social History  ? ?Socioeconomic History  ? Marital status: Married  ?  Spouse name: Not on file  ? Number of children: Not on file  ? Years of education: Not on file  ? Highest education level: Not on file  ?Occupational History  ? Not on  file  ?Tobacco Use  ? Smoking status: Never  ? Smokeless tobacco: Never  ?Vaping Use  ? Vaping Use: Never used  ?Substance and Sexual Activity  ? Alcohol use: Yes  ?  Alcohol/week: 2.0 standard drinks  ?  Types: 2 Standard drinks or equivalent per week  ? Drug use: No  ? Sexual activity: Not on file  ?Other Topics Concern  ? Not on file  ?Social History Narrative  ? Not on file  ? ?Social Determinants of Health  ? ?Financial Resource Strain: Not on file  ?Food Insecurity: Not on file  ?Transportation Needs: Not on file  ?Physical Activity: Not on file  ?Stress: Not on file  ?Social Connections: Not on file  ? ?No Known Allergies ?Family History  ?Problem Relation Age of Onset  ? Arthritis Mother   ? Hypertension Mother   ? Kidney disease Mother   ? Diabetes Mother   ? Diabetes Father   ? Colon cancer Neg Hx   ? Colon polyps Neg Hx   ? Esophageal cancer Neg Hx   ? Rectal cancer Neg Hx   ? Stomach cancer Neg Hx   ? Pancreatic cancer Neg Hx   ? ? ?Current Outpatient Medications (Endocrine & Metabolic):  ?  predniSONE (DELTASONE) 5 MG tablet, Take 1 tablet (5 mg total) by mouth daily  with breakfast. ?  tirzepatide (MOUNJARO) 5 MG/0.5ML Pen, Inject 5 mg into the skin once a week. ? ?Current Outpatient Medications (Cardiovascular):  ?  hydrochlorothiazide (MICROZIDE) 12.5 MG capsule, Take 12.5 mg by mouth every other day. ? ? ?Current Outpatient Medications (Analgesics):  ?  allopurinol (ZYLOPRIM) 100 MG tablet, Take 100 mg by mouth daily. ? ? ?Current Outpatient Medications (Other):  ?  Cholecalciferol (VITAMIN D) 125 MCG (5000 UT) CAPS, Take 5,000 Units by mouth daily.  ?  cycloSPORINE modified (NEORAL) 100 MG capsule, Take 100 mg by mouth 2 (two) times daily. ?  cycloSPORINE modified (NEORAL) 25 MG capsule,  ?  doxycycline (MONODOX) 100 MG capsule, Take 100 mg by mouth daily at 2 PM. ?  magnesium oxide-pyridoxine (BEELITH) 362-20 MG TABS, Take 1 tablet by mouth daily. ?  Multiple Vitamin (MULTI-VITAMIN) tablet, Take by  mouth. ?  mycophenolate (CELLCEPT) 250 MG capsule, Take 750 mg by mouth 2 (two) times daily. ?  Omega-3 Fatty Acids (FISH OIL PO), Take by mouth. ?  polyethylene glycol (MIRALAX / GLYCOLAX) 17 g packet, Take 17 g by mouth daily as needed. ?  selenium sulfide (SELSUN) 2.5 % shampoo, Apply topically as directed. ?  Sulfacetamide Sodium-Sulfur 8-4 % SUSP,  ? ? ?Objective  ?Blood pressure 122/82, pulse 77, height 6\' 5"  (1.956 m), weight 252 lb (114.3 kg), SpO2 99 %. ?  ?General: No apparent distress alert and oriented x3 mood and affect normal, dressed appropriately.  ? ?After informed written and verbal consent, patient was seated on exam table. Right knee was prepped with alcohol swab and utilizing anterolateral approach, patient's right knee space was injected with15 mg/2.5 mL of Orthovisc(sodium hyaluronate) in a prefilled syringe was injected easily into the knee through a 22-gauge needle..Patient tolerated the procedure well without immediate complications. ?  ?Impression and Recommendations:  ?  ? ?The above documentation has been reviewed and is accurate and complete , DO ? ? ? ?

## 2021-05-04 NOTE — Progress Notes (Signed)
Chief Complaint:   OBESITY Seth Morales is here to discuss his progress with his obesity treatment plan along with follow-up of his obesity related diagnoses. Seth Morales is on practicing portion control and making smarter food choices, such as increasing vegetables and decreasing simple carbohydrates and states he is following his eating plan approximately 70% of the time. Seth Morales states he is going to the gym for 60 minutes 4 times per week.  Today's visit was #: 25 Starting weight: 285 lbs Starting date: 05/22/2019 Today's weight: 249 lbs Today's date: 05/04/2021 Total lbs lost to date: 36 lbs Total lbs lost since last in-office visit: 0  Interim History:  Seth Morales's BP is elevated OV today- 144/77, HR 64 He reports enjoying a healthy amount of Sangria last night. He denies acute cardiac sx's at present.  Going to [place] inclusive [place] his [place]!  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  Subjective:   1. Insulin resistance After okay from Nephrology - started on Mounjaro 2.5 mg once weekly on 11/30/2020. Mounjaro 5 mg - injects on Thursdays - side effect, constipation. Going to [place] all inclusive [place] - for his 61th birthday with his wife and brother and sister-in-law. Hold Mounjaro from 2 March, restart 9/10 March.  2. Essential hypertension Seth Morales's BP is elevated OV today- 144/77, HR 64 He reports enjoying a healthy amount of Sangria last night. He denies acute cardiac sx's at present. Home SBP 116-130, DBP 70-80. She is on HCTZ 12.5 mg every other day.  3. Vitamin D deficiency He is on OTC vitamin D3 5000 IU daily.  4. Dyslipidemia He is not on statin therapy. He denies tobacco/vape use.  5. At risk for heart disease Seth Morales is at higher than average risk for cardiovascular disease due to  hyperlipidemia, obesity, and hypertension.  Assessment/Plan:   1. Insulin resistance Refill Mounjaro 5 mg once weekly. Check labs today.  - Hemoglobin A1c - Insulin, random - Refill tirzepatide (MOUNJARO) 5 MG/0.5ML Pen; Inject 5 mg into the skin once a week.  Dispense: 6 mL; Refill: 0  2. Essential hypertension Check labs today.  - Comprehensive metabolic panel  3. Vitamin D deficiency Check vitamin D level today.  - VITAMIN D 25 Hydroxy (Vit-D Deficiency, Fractures)  4. Dyslipidemia Check labs today.  5. At risk for heart disease Seth Morales was given approximately 15 minutes of coronary artery disease prevention counseling today. He is 61 y.o. male and has risk factors for heart disease including obesity. We discussed intensive lifestyle modifications today with an emphasis on specific weight loss instructions and strategies.  Repetitive spaced learning was employed today to elicit superior memory formation and behavioral change.   6. Obesity: Current BMI 29.6  Seth Morales is currently in the action stage of change. As such, his goal is to continue with weight loss efforts. He has agreed to practicing portion control and making smarter food choices, such as increasing vegetables and decreasing simple carbohydrates.   Exercise goals:  As is.  Behavioral modification strategies: increasing lean protein intake, decreasing simple carbohydrates, meal planning and cooking strategies, keeping healthy foods in the home, and planning for success.  Seth Morales has agreed to follow-up with our clinic in 3 weeks. He was informed of the importance of frequent follow-up visits to maximize his success with intensive lifestyle modifications  for his multiple health conditions.   Objective:   Blood pressure (!) 144/78, pulse 64, temperature 98 F (36.7 C), height 6\' 5"  (1.956 m), weight 249 lb (112.9 kg), SpO2 99 %. Body mass index is 29.53 kg/m.  General: Cooperative, alert, well developed, in  no acute distress. HEENT: Conjunctivae and lids unremarkable. Cardiovascular: Regular rhythm.  Lungs: Normal work of breathing. Neurologic: No focal deficits.   Lab Results  Component Value Date   CREATININE 1.20 06/25/2020   BUN 21 06/25/2020   NA 143 06/25/2020   K 3.9 06/25/2020   CL 102 06/25/2020   CO2 25 06/25/2020   Lab Results  Component Value Date   ALT 26 06/25/2020   AST 23 06/25/2020   ALKPHOS 70 06/25/2020   BILITOT 0.8 06/25/2020   Lab Results  Component Value Date   HGBA1C 5.5 06/25/2020   HGBA1C 5.4 05/22/2019   HGBA1C 5.4 11/30/2018   HGBA1C 5.6 07/12/2017   HGBA1C 5.2 11/07/2014   Lab Results  Component Value Date   INSULIN 8.5 06/25/2020   INSULIN 6.0 01/20/2020   INSULIN 14.6 05/22/2019   Lab Results  Component Value Date   TSH 2.970 05/22/2019   Lab Results  Component Value Date   CHOL 180 06/25/2020   HDL 51 06/25/2020   LDLCALC 109 (H) 06/25/2020   LDLDIRECT 114.0 03/14/2019   TRIG 113 06/25/2020   CHOLHDL 3.4 01/20/2020   Lab Results  Component Value Date   VD25OH 61.9 06/25/2020   VD25OH 96.6 01/20/2020   VD25OH 56.28 03/14/2019   Lab Results  Component Value Date   WBC 6.4 01/20/2020   HGB 14.8 01/20/2020   HCT 45.5 01/20/2020   MCV 93 01/20/2020   PLT 177 01/20/2020   Attestation Statements:   Reviewed by clinician on day of visit: allergies, medications, problem list, medical history, surgical history, family history, social history, and previous encounter notes.  I, Water quality scientist, CMA, am acting as Location manager for Mina Marble, NP.  I have reviewed the above documentation for accuracy and completeness, and I agree with the above. -  Salaya Holtrop d. Bradey Luzier, NP-C

## 2021-05-05 ENCOUNTER — Ambulatory Visit (INDEPENDENT_AMBULATORY_CARE_PROVIDER_SITE_OTHER): Payer: BC Managed Care – PPO | Admitting: Family Medicine

## 2021-05-05 ENCOUNTER — Encounter: Payer: Self-pay | Admitting: Family Medicine

## 2021-05-05 DIAGNOSIS — M1711 Unilateral primary osteoarthritis, right knee: Secondary | ICD-10-CM

## 2021-05-05 LAB — COMPREHENSIVE METABOLIC PANEL WITH GFR
ALT: 27 IU/L (ref 0–44)
AST: 31 IU/L (ref 0–40)
Albumin/Globulin Ratio: 2.3 — ABNORMAL HIGH (ref 1.2–2.2)
Albumin: 4.4 g/dL (ref 3.8–4.9)
Alkaline Phosphatase: 62 IU/L (ref 44–121)
BUN/Creatinine Ratio: 20 (ref 9–20)
BUN: 22 mg/dL (ref 6–24)
Bilirubin Total: 0.7 mg/dL (ref 0.0–1.2)
CO2: 25 mmol/L (ref 20–29)
Calcium: 9.1 mg/dL (ref 8.7–10.2)
Chloride: 97 mmol/L (ref 96–106)
Creatinine, Ser: 1.08 mg/dL (ref 0.76–1.27)
Globulin, Total: 1.9 g/dL (ref 1.5–4.5)
Glucose: 83 mg/dL (ref 70–99)
Potassium: 4.1 mmol/L (ref 3.5–5.2)
Sodium: 137 mmol/L (ref 134–144)
Total Protein: 6.3 g/dL (ref 6.0–8.5)
eGFR: 79 mL/min/1.73 (ref >59)

## 2021-05-05 LAB — HEMOGLOBIN A1C
Est. average glucose Bld gHb Est-mCnc: 105 mg/dL
Hgb A1c MFr Bld: 5.3 % (ref 4.8–5.6)

## 2021-05-05 LAB — VITAMIN D 25 HYDROXY (VIT D DEFICIENCY, FRACTURES): Vit D, 25-Hydroxy: 74 ng/mL (ref 30.0–100.0)

## 2021-05-05 LAB — INSULIN, RANDOM: INSULIN: 12.8 u[IU]/mL (ref 2.6–24.9)

## 2021-05-05 NOTE — Assessment & Plan Note (Signed)
Viscosupplementation second in a series of 4 injections.  Patient will follow-up in 1 week for third in the series. ?

## 2021-05-13 NOTE — Progress Notes (Signed)
?Terrilee Files D.O. ?[facility] ?[address] [place] [address] ?Phone: [phone] ?Subjective:   ?I, [name], am serving as a scribe for Dr. Antoine Primas. ? ?This visit occurred during the SARS-CoV-2 public health emergency.  Safety protocols were in place, including screening questions prior to the visit, additional usage of staff PPE, and extensive cleaning of exam room while observing appropriate contact time as indicated for disinfecting solutions.  ? ? ?I'm seeing this patient by the request  of:  Pcp, No ? ?CC: Right knee pain follow-up ? ?[id number]  ?Seth Morales is a 61 y.o. male coming in with complaint of R knee and R shoulder pain. Patient last seen on 05/07/2021 for 2nd visco injection. Patient states that he fell on his trip. Swelling in R hand after FOOSH injury. Pain throughout metacarpal heads. Also has 8x4in band of ecchymosis in LLQ.  ? ?Did try swimming which hurt the R knee as he was using flippers.  ? ? ? ?  ? ?Past Medical History:  ?Diagnosis Date  ? Allergy   ? Anemia   ? Back pain   ? Bilateral swelling of feet   ? Blood transfusion without reported diagnosis 13 years ago   ? Chronic kidney disease   ? Dermatitis   ? Hypertension   ? Joint pain   ? Kidney transplanted   ? Vitamin D deficiency   ? ?Past Surgical History:  ?Procedure Laterality Date  ? COLONOSCOPY  09/06/2012  ? at [facility]  ? EYE SURGERY    ? INSERTION OF DIALYSIS CATHETER    ? KIDNEY TRANSPLANT  2006  ? KNEE ARTHROPLASTY Bilateral   ? NEPHRECTOMY Bilateral   ? ?Social History  ? ?Socioeconomic History  ? Marital status: Married  ?  Spouse name: Not on file  ? Number of children: Not on file  ? Years of education: Not on file  ? Highest education level: Not on file  ?Occupational History  ? Not on file  ?Tobacco Use  ? Smoking status: Never  ? Smokeless tobacco: Never  ?Vaping Use  ? Vaping Use: Never used  ?Substance and Sexual Activity  ? Alcohol use: Yes  ?  Alcohol/week: 2.0 standard  drinks  ?  Types: 2 Standard drinks or equivalent per week  ? Drug use: No  ? Sexual activity: Not on file  ?Other Topics Concern  ? Not on file  ?Social History Narrative  ? Not on file  ? ?Social Determinants of Health  ? ?Financial Resource Strain: Not on file  ?Food Insecurity: Not on file  ?Transportation Needs: Not on file  ?Physical Activity: Not on file  ?Stress: Not on file  ?Social Connections: Not on file  ? ?No Known Allergies ?Family History  ?Problem Relation Age of Onset  ? Arthritis Mother   ? Hypertension Mother   ? Kidney disease Mother   ? Diabetes Mother   ? Diabetes Father   ? Colon cancer Neg Hx   ? Colon polyps Neg Hx   ? Esophageal cancer Neg Hx   ? Rectal cancer Neg Hx   ? Stomach cancer Neg Hx   ? Pancreatic cancer Neg Hx   ? ? ?Current Outpatient Medications (Endocrine & Metabolic):  ?  predniSONE (DELTASONE) 5 MG tablet, Take 1 tablet (5 mg total) by mouth daily with breakfast. ?  tirzepatide (MOUNJARO) 5 MG/0.5ML Pen, Inject 5 mg into the skin once a week. ? ?Current Outpatient Medications (Cardiovascular):  ?  hydrochlorothiazide (MICROZIDE) 12.5 MG capsule, Take 12.5 mg by mouth every other day. ? ? ?Current Outpatient Medications (Analgesics):  ?  allopurinol (ZYLOPRIM) 100 MG tablet, Take 100 mg by mouth daily. ? ? ?Current Outpatient Medications (Other):  ?  doxycycline (VIBRA-TABS) 100 MG tablet, Take 1 tablet (100 mg total) by mouth 2 (two) times daily. ?  Cholecalciferol (VITAMIN D) 125 MCG (5000 UT) CAPS, Take 5,000 Units by mouth daily.  ?  cycloSPORINE modified (NEORAL) 100 MG capsule, Take 100 mg by mouth 2 (two) times daily. ?  cycloSPORINE modified (NEORAL) 25 MG capsule,  ?  doxycycline (MONODOX) 100 MG capsule, Take 100 mg by mouth daily at 2 PM. ?  magnesium oxide-pyridoxine (BEELITH) 362-20 MG TABS, Take 1 tablet by mouth daily. ?  Multiple Vitamin (MULTI-VITAMIN) tablet, Take by mouth. ?  mycophenolate (CELLCEPT) 250 MG capsule, Take 750 mg by mouth 2 (two) times  daily. ?  Omega-3 Fatty Acids (FISH OIL PO), Take by mouth. ?  polyethylene glycol (MIRALAX / GLYCOLAX) 17 g packet, Take 17 g by mouth daily as needed. ?  selenium sulfide (SELSUN) 2.5 % shampoo, Apply topically as directed. ?  Sulfacetamide Sodium-Sulfur 8-4 % SUSP,  ?Blood pressure 130/88, pulse 85, height 6\' 5"  (1.956 m), weight 257 lb (116.6 kg), SpO2 98 %. ? ?Patient's right hand does have some mild swelling noted over the dorsal aspect of the hand.  Patient's some bruising of the anterior flank.  The patient does have his kidney replacement in this area.  Patient is nontender though.  States that he is having urination without any difficulty. ? ?After informed written and verbal consent, patient was seated on exam table. Right knee was prepped with alcohol swab and utilizing anterolateral approach, patient's right knee space was injected with15 mg/2.5 mL of Orthovisc(sodium hyaluronate) in a prefilled syringe was injected easily into the knee through a 22-gauge needle..Patient tolerated the procedure well without immediate complications. ?  ?Impression and Recommendations:  ?  ? ?The above documentation has been reviewed and is accurate and complete , DO ? ? ? ?

## 2021-05-14 ENCOUNTER — Encounter: Payer: Self-pay | Admitting: Family Medicine

## 2021-05-14 ENCOUNTER — Ambulatory Visit (INDEPENDENT_AMBULATORY_CARE_PROVIDER_SITE_OTHER): Payer: BC Managed Care – PPO | Admitting: Family Medicine

## 2021-05-14 ENCOUNTER — Ambulatory Visit (INDEPENDENT_AMBULATORY_CARE_PROVIDER_SITE_OTHER): Payer: BC Managed Care – PPO

## 2021-05-14 ENCOUNTER — Other Ambulatory Visit: Payer: Self-pay

## 2021-05-14 VITALS — BP 130/88 | HR 85 | Ht 77.0 in | Wt 257.0 lb

## 2021-05-14 DIAGNOSIS — M1711 Unilateral primary osteoarthritis, right knee: Secondary | ICD-10-CM

## 2021-05-14 DIAGNOSIS — M79641 Pain in right hand: Secondary | ICD-10-CM

## 2021-05-14 DIAGNOSIS — T148XXA Other injury of unspecified body region, initial encounter: Secondary | ICD-10-CM | POA: Diagnosis not present

## 2021-05-14 MED ORDER — DOXYCYCLINE HYCLATE 100 MG PO TABS: 100.0000 mg | ORAL_TABLET | Freq: Two times a day (BID) | ORAL | 0 refills | Status: DC

## 2021-05-14 NOTE — Assessment & Plan Note (Signed)
Patient does have a bruise on the abdominal area.  Not having any pain now multiple days after the injury.  Is near where patient's kidney transplant.  Patient knows to watch for any signs or symptoms but seems to be doing okay otherwise. ?

## 2021-05-14 NOTE — Patient Instructions (Signed)
Xray today ?See you again next week ?

## 2021-05-14 NOTE — Progress Notes (Unsigned)
[name] [address] [place] [address] Phone: [phone] Subjective:    I'm seeing this patient by the request  of:  Pcp, No  CC:   [id number]  Seth Morales is a 61 y.o. male coming in with complaint of R knee pain. Here for final Orthovisc injection. Patient states       Past Medical History:  Diagnosis Date   Allergy    Anemia    Back pain    Bilateral swelling of feet    Blood transfusion without reported diagnosis 13 years ago    Chronic kidney disease    Dermatitis    Hypertension    Joint pain    Kidney transplanted    Vitamin D deficiency    Past Surgical History:  Procedure Laterality Date   COLONOSCOPY  09/06/2012   at [place],   EYE SURGERY     INSERTION OF DIALYSIS CATHETER     KIDNEY TRANSPLANT  2006   KNEE ARTHROPLASTY Bilateral    NEPHRECTOMY Bilateral    Social History   Socioeconomic History   Marital status: Married    Spouse name: Not on file   Number of children: Not on file   Years of education: Not on file   Highest education level: Not on file  Occupational History   Not on file  Tobacco Use   Smoking status: Never   Smokeless tobacco: Never  Vaping Use   Vaping Use: Never used  Substance and Sexual Activity   Alcohol use: Yes    Alcohol/week: 2.0 standard drinks    Types: 2 Standard drinks or equivalent per week   Drug use: No   Sexual activity: Not on file  Other Topics Concern   Not on file  Social History Narrative   Not on file   Social Determinants of Health   Financial Resource Strain: Not on file  Food Insecurity: Not on file  Transportation Needs: Not on file  Physical Activity: Not on file  Stress: Not on file  Social Connections: Not on file   No Known Allergies Family History  Problem Relation Age of Onset   Arthritis Mother    Hypertension Mother    Kidney disease Mother    Diabetes Mother    Diabetes Father    Colon cancer Neg Hx    Colon  polyps Neg Hx    Esophageal cancer Neg Hx    Rectal cancer Neg Hx    Stomach cancer Neg Hx    Pancreatic cancer Neg Hx     Current Outpatient Medications (Endocrine & Metabolic):    predniSONE (DELTASONE) 5 MG tablet, Take 1 tablet (5 mg total) by mouth daily with breakfast.   tirzepatide (MOUNJARO) 5 MG/0.5ML Pen, Inject 5 mg into the skin once a week.  Current Outpatient Medications (Cardiovascular):    hydrochlorothiazide (MICROZIDE) 12.5 MG capsule, Take 12.5 mg by mouth every other day.   Current Outpatient Medications (Analgesics):    allopurinol (ZYLOPRIM) 100 MG tablet, Take 100 mg by mouth daily.   Current Outpatient Medications (Other):    Cholecalciferol (VITAMIN D) 125 MCG (5000 UT) CAPS, Take 5,000 Units by mouth daily.    cycloSPORINE modified (NEORAL) 100 MG capsule, Take 100 mg by mouth 2 (two) times daily.   cycloSPORINE modified (NEORAL) 25 MG capsule,    doxycycline (MONODOX) 100 MG capsule, Take 100 mg by mouth daily at 2 PM.   magnesium oxide-pyridoxine (BEELITH) 362-20 MG TABS,  Take 1 tablet by mouth daily.   Multiple Vitamin (MULTI-VITAMIN) tablet, Take by mouth.   mycophenolate (CELLCEPT) 250 MG capsule, Take 750 mg by mouth 2 (two) times daily.   Omega-3 Fatty Acids (FISH OIL PO), Take by mouth.   polyethylene glycol (MIRALAX / GLYCOLAX) 17 g packet, Take 17 g by mouth daily as needed.   selenium sulfide (SELSUN) 2.5 % shampoo, Apply topically as directed.   Sulfacetamide Sodium-Sulfur 8-4 % SUSP,    Reviewed prior external information including notes and imaging from  primary care provider As well as notes that were available from care everywhere and other healthcare systems.  Past medical history, social, surgical and family history all reviewed in electronic medical record.  No pertanent information unless stated regarding to the chief complaint.   Review of Systems:  No headache, visual changes, nausea, vomiting, diarrhea, constipation, dizziness,  abdominal pain, skin rash, fevers, chills, night sweats, weight loss, swollen lymph nodes, body aches, joint swelling, chest pain, shortness of breath, mood changes. POSITIVE muscle aches  Objective  There were no vitals taken for this visit.   General: No apparent distress alert and oriented x3 mood and affect normal, dressed appropriately.  HEENT: Pupils equal, extraocular movements intact  Respiratory: Patient's speak in full sentences and does not appear short of breath  Cardiovascular: No lower extremity edema, non tender, no erythema  Gait normal with good balance and coordination.  MSK:  Non tender with full range of motion and good stability and symmetric strength and tone of shoulders, elbows, wrist, hip, knee and ankles bilaterally.     Impression and Recommendations:     The above documentation has been reviewed and is accurate and complete [name]

## 2021-05-19 ENCOUNTER — Encounter: Payer: Self-pay | Admitting: Family Medicine

## 2021-05-19 ENCOUNTER — Other Ambulatory Visit: Payer: Self-pay

## 2021-05-19 ENCOUNTER — Ambulatory Visit (INDEPENDENT_AMBULATORY_CARE_PROVIDER_SITE_OTHER): Payer: BC Managed Care – PPO | Admitting: Family Medicine

## 2021-05-19 DIAGNOSIS — M1711 Unilateral primary osteoarthritis, right knee: Secondary | ICD-10-CM

## 2021-05-19 NOTE — Assessment & Plan Note (Signed)
Fourth and final viscosupplementation given today.  Tolerated procedure well.  Discussed icing regimen and home exercises and discussed how it could take 4 weeks.  Follow-up again in 4 weeks. ?

## 2021-05-19 NOTE — Patient Instructions (Addendum)
Last one today ?Good luck with show ?See me again in 3-4 weeks ?

## 2021-06-01 ENCOUNTER — Encounter (INDEPENDENT_AMBULATORY_CARE_PROVIDER_SITE_OTHER): Payer: Self-pay | Admitting: Adult Health

## 2021-06-01 ENCOUNTER — Telehealth (INDEPENDENT_AMBULATORY_CARE_PROVIDER_SITE_OTHER): Payer: BC Managed Care – PPO | Admitting: Adult Health

## 2021-06-01 ENCOUNTER — Other Ambulatory Visit: Payer: Self-pay

## 2021-06-01 ENCOUNTER — Other Ambulatory Visit (INDEPENDENT_AMBULATORY_CARE_PROVIDER_SITE_OTHER): Payer: Self-pay | Admitting: Family Medicine

## 2021-06-01 DIAGNOSIS — E8881 Metabolic syndrome: Secondary | ICD-10-CM | POA: Diagnosis not present

## 2021-06-01 DIAGNOSIS — E559 Vitamin D deficiency, unspecified: Secondary | ICD-10-CM | POA: Diagnosis not present

## 2021-06-01 DIAGNOSIS — E669 Obesity, unspecified: Secondary | ICD-10-CM

## 2021-06-01 DIAGNOSIS — I10 Essential (primary) hypertension: Secondary | ICD-10-CM | POA: Diagnosis not present

## 2021-06-01 DIAGNOSIS — Z6829 Body mass index (BMI) 29.0-29.9, adult: Secondary | ICD-10-CM

## 2021-06-01 MED ORDER — TIRZEPATIDE 5 MG/0.5ML ~~LOC~~ SOAJ: 5.0000 mg | SUBCUTANEOUS | 0 refills | Status: DC

## 2021-06-01 NOTE — Telephone Encounter (Signed)
Pa needed

## 2021-06-01 NOTE — Telephone Encounter (Signed)
Prior authorization started for Mounjaro. Will notify patient and provider once response is received.  ?

## 2021-06-02 ENCOUNTER — Telehealth (INDEPENDENT_AMBULATORY_CARE_PROVIDER_SITE_OTHER): Payer: Self-pay | Admitting: Adult Health

## 2021-06-02 ENCOUNTER — Encounter (INDEPENDENT_AMBULATORY_CARE_PROVIDER_SITE_OTHER): Payer: Self-pay

## 2021-06-02 NOTE — Progress Notes (Addendum)
? ? ?TeleHealth Visit:  ?Due to the COVID-19 pandemic, this visit was completed with telemedicine (audio/video) technology to reduce patient and provider exposure as well as to preserve personal protective equipment.  ? ?Seth Morales has verbally consented to this TeleHealth visit. The patient is located at home, the provider is located at the [facility] office. The participants in this visit include the listed provider and patient. The visit was conducted today via MyChart video. ? ?Chief Complaint: OBESITY ?Seth Morales is here to discuss his progress with his obesity treatment plan along with follow-up of his obesity related diagnoses. Seth Morales is on practicing portion control and making smarter food choices, such as increasing vegetables and decreasing simple carbohydrates and states he is following his eating plan approximately 66.6% of the time. Seth Morales states he walked while on vacation. ? ?Today's visit was #: 26 ?Starting weight: 285 lbs ?Starting date: 05/22/2019 ? 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 ?A few days afterwards he developed pharyngitis, nasal drainage, chills, headache.   ?He denies cough. ?He was seen at [facility] on 05/26/21 ?COVID-19 infection negative, strep negative.   ?He was treated with amoxicillin BID, which he will complete on 06/02/2021. ? ?He denies dyspnea, however he states "I continue to feel yucky". ? ?Of note:  Doxycycline 100 mg daily for rosacea.  ? ?Subjective:  ? ?1. Essential hypertension ?Discussed labs with patient today.  ?He is on HCTZ 12.5 mg daily. ?On 05/04/2021, CMP - kidney function, electrolytes - stable. ? ?2. Insulin resistance ?Worsening.  Discussed labs with patient today.  ?On 04/29/2021, BG 83, A1c 5.3, insulin level 12.8. ?After okay from Nephrology - started on Mounjaro 2.5 mg once weekly on 11/30/2020. ?Mounjaro 5 mg - injects on Thursdays - side effect,  constipation. ?Hold Mounjaro on 2 March for a trip to [place] - all inclusive resort - for his 61th birthday with his wife and brother and sister-in-law. ?He restarted 10 March-tolerating well. ? ?3. Vitamin D deficiency ?Discussed labs with patient today.  ?On 05/04/2021, vitamin D level - 74.0 - stable. ?He is currently taking OTC vitamin D3 5000 IU each day. He denies nausea, vomiting or muscle weakness. ? ?Assessment/Plan:  ? ?1. Essential hypertension ?Continue daily diuretic. ? ?2. Insulin resistance ?Refill Mounjaro 5 mg once weekly. ? ?- Refill tirzepatide (MOUNJARO) 5 MG/0.5ML Pen; Inject 5 mg into the skin once a week.  Dispense: 6 mL; Refill: 0 ? ?3. Vitamin D deficiency ?Continue OTC vitamin D3 5000 IU daily. ? ?4. Obesity: Current BMI 29.6 ? ?Seth Morales is currently in the action stage of change. As such, his goal is to continue with weight loss efforts. He has agreed to practicing portion control and making smarter food choices, such as increasing vegetables and decreasing simple carbohydrates.  ? ?Exercise goals:  As is. ? ?Behavioral modification strategies: increasing lean protein intake, decreasing simple carbohydrates, meal planning and cooking strategies, keeping healthy foods in the home, and planning for success. ? ?Seth Morales has agreed to follow-up with our clinic in 3-4 weeks. He was informed of the importance of frequent follow-up visits to maximize his success with intensive lifestyle modifications for his multiple health conditions. ? ?Objective:  ? ?VITALS: Per patient if applicable, see vitals. ?GENERAL: Alert and in no acute distress. ?CARDIOPULMONARY: No increased WOB. Speaking in clear sentences.  ?PSYCH: Pleasant and cooperative. Speech normal rate and rhythm. Affect is appropriate. Insight and judgement  are appropriate. Attention is focused, linear, and appropriate.  ?NEURO: Oriented as arrived to appointment on time with no prompting.  ? ?Lab Results  ?Component Value Date  ? CREATININE  1.08 05/04/2021  ? BUN 22 05/04/2021  ? NA 137 05/04/2021  ? K 4.1 05/04/2021  ? CL 97 05/04/2021  ? CO2 25 05/04/2021  ? ?Lab Results  ?Component Value Date  ? ALT 27 05/04/2021  ? AST 31 05/04/2021  ? ALKPHOS 62 05/04/2021  ? BILITOT 0.7 05/04/2021  ? ?Lab Results  ?Component Value Date  ? HGBA1C 5.3 05/04/2021  ? HGBA1C 5.5 06/25/2020  ? HGBA1C 5.4 05/22/2019  ? HGBA1C 5.4 11/30/2018  ? HGBA1C 5.6 07/12/2017  ? ?Lab Results  ?Component Value Date  ? INSULIN 12.8 05/04/2021  ? INSULIN 8.5 06/25/2020  ? INSULIN 6.0 01/20/2020  ? INSULIN 14.6 05/22/2019  ? ?Lab Results  ?Component Value Date  ? TSH 2.970 05/22/2019  ? ?Lab Results  ?Component Value Date  ? CHOL 180 06/25/2020  ? HDL 51 06/25/2020  ? LDLCALC 109 (H) 06/25/2020  ? LDLDIRECT 114.0 03/14/2019  ? TRIG 113 06/25/2020  ? CHOLHDL 3.4 01/20/2020  ? ?Lab Results  ?Component Value Date  ? VD25OH 74.0 05/04/2021  ? VD25OH 61.9 06/25/2020  ? VD25OH 96.6 01/20/2020  ? ?Lab Results  ?Component Value Date  ? WBC 6.4 01/20/2020  ? HGB 14.8 01/20/2020  ? HCT 45.5 01/20/2020  ? MCV 93 01/20/2020  ? PLT 177 01/20/2020  ? ?Attestation Statements:  ? ?Reviewed by clinician on day of visit: allergies, medications, problem list, medical history, surgical history, family history, social history, and previous encounter notes. ? ?Time spent on visit including pre-visit chart review and post-visit charting and care was 28 minutes.  ? ?I, Water quality scientist, CMA, am acting as Location manager for Mina Marble, NP. ? ?I have reviewed the above documentation for accuracy and completeness, and I agree with the above. -Zakariya Knickerbocker d.Danai Gotto, NP-C ?

## 2021-06-02 NOTE — Telephone Encounter (Signed)
Prior authorization denied for Mounjaro. Patient already uses copay card. Patient sent denial message via mychart. ?

## 2021-06-21 NOTE — Progress Notes (Signed)
?Seth Morales D.O. ?[facility] ?[address] [place] [address] ?Phone: [phone] ?Subjective:   ?I, Seth Morales, am serving as a scribe for Dr. Antoine Primas. ? ?This visit occurred during the SARS-CoV-2 public health emergency.  Safety protocols were in place, including screening questions prior to the visit, additional usage of staff PPE, and extensive cleaning of exam room while observing appropriate contact time as indicated for disinfecting solutions.  ? ?I'm seeing this patient by the request  of:  Pcp, No ? ?CC: right knee pain  ? ?[id number]  ?05/19/2021 ?Fourth and final viscosupplementation given today.  Tolerated procedure well.  Discussed icing regimen and home exercises and discussed how it could take 4 weeks.  Follow-up again in 4 weeks. ? ?Updated 06/22/2021 ?Seth Morales is a 61 y.o. male coming in with complaint of right knee pain. F/u from gel injections. Patient said that his R knee is doing better. Is now having similar pain over patella in L leg.  ? ?Also notes pain in L hip over greater trochanter. Pain started again for past couple of weeks.  ? ?Also complains of R CMC joint pain and pain throughout the other fingers after shaking someone's hand.  ? ? ?  ? ?Past Medical History:  ?Diagnosis Date  ? Allergy   ? Anemia   ? Back pain   ? Bilateral swelling of feet   ? Blood transfusion without reported diagnosis 13 years ago   ? Chronic kidney disease   ? Dermatitis   ? Hypertension   ? Joint pain   ? Kidney transplanted   ? Vitamin D deficiency   ? ?Past Surgical History:  ?Procedure Laterality Date  ? COLONOSCOPY  09/06/2012  ? at [facility]  ? EYE SURGERY    ? INSERTION OF DIALYSIS CATHETER    ? KIDNEY TRANSPLANT  2006  ? KNEE ARTHROPLASTY Bilateral   ? NEPHRECTOMY Bilateral   ? ?Social History  ? ?Socioeconomic History  ? Marital status: Married  ?  Spouse name: Not on file  ? Number of children: Not on file  ? Years of education: Not on file  ? Highest  education level: Not on file  ?Occupational History  ? Not on file  ?Tobacco Use  ? Smoking status: Never  ? Smokeless tobacco: Never  ?Vaping Use  ? Vaping Use: Never used  ?Substance and Sexual Activity  ? Alcohol use: Yes  ?  Alcohol/week: 2.0 standard drinks  ?  Types: 2 Standard drinks or equivalent per week  ? Drug use: No  ? Sexual activity: Not on file  ?Other Topics Concern  ? Not on file  ?Social History Narrative  ? Not on file  ? ?Social Determinants of Health  ? ?Financial Resource Strain: Not on file  ?Food Insecurity: Not on file  ?Transportation Needs: Not on file  ?Physical Activity: Not on file  ?Stress: Not on file  ?Social Connections: Not on file  ? ?No Known Allergies ?Family History  ?Problem Relation Age of Onset  ? Arthritis Mother   ? Hypertension Mother   ? Kidney disease Mother   ? Diabetes Mother   ? Diabetes Father   ? Colon cancer Neg Hx   ? Colon polyps Neg Hx   ? Esophageal cancer Neg Hx   ? Rectal cancer Neg Hx   ? Stomach cancer Neg Hx   ? Pancreatic cancer Neg Hx   ? ? ?Current Outpatient Medications (Endocrine & Metabolic):  ?  predniSONE (DELTASONE) 5 MG tablet, Take 1 tablet (5 mg total) by mouth daily with breakfast. ?  tirzepatide (MOUNJARO) 5 MG/0.5ML Pen, Inject 5 mg into the skin once a week. ? ?Current Outpatient Medications (Cardiovascular):  ?  hydrochlorothiazide (MICROZIDE) 12.5 MG capsule, Take 12.5 mg by mouth every other day. ? ? ?Current Outpatient Medications (Analgesics):  ?  allopurinol (ZYLOPRIM) 100 MG tablet, Take 100 mg by mouth daily. ? ? ?Current Outpatient Medications (Other):  ?  amoxicillin (AMOXIL) 875 MG tablet, Take 875 mg by mouth 2 (two) times daily. ?  Cholecalciferol (VITAMIN D) 125 MCG (5000 UT) CAPS, Take 5,000 Units by mouth daily.  ?  cycloSPORINE modified (NEORAL) 100 MG capsule, Take 100 mg by mouth 2 (two) times daily. ?  cycloSPORINE modified (NEORAL) 25 MG capsule,  ?  doxycycline (MONODOX) 100 MG capsule, Take 100 mg by mouth daily at  2 PM. ?  magnesium oxide-pyridoxine (BEELITH) 362-20 MG TABS, Take 1 tablet by mouth daily. ?  Multiple Vitamin (MULTI-VITAMIN) tablet, Take by mouth. ?  mycophenolate (CELLCEPT) 250 MG capsule, Take 750 mg by mouth 2 (two) times daily. ?  Omega-3 Fatty Acids (FISH OIL PO), Take by mouth. ?  polyethylene glycol (MIRALAX / GLYCOLAX) 17 g packet, Take 17 g by mouth daily as needed. ?  selenium sulfide (SELSUN) 2.5 % shampoo, Apply topically as directed. ?  Sulfacetamide Sodium-Sulfur 8-4 % SUSP,  ? ? ?Reviewed prior external information including notes and imaging from  ?primary care provider ?As well as notes that were available from care everywhere and other healthcare systems. ? ?Past medical history, social, surgical and family history all reviewed in electronic medical record.  No pertanent information unless stated regarding to the chief complaint.  ? ?Review of Systems: ? No headache, visual changes, nausea, vomiting, diarrhea, constipation, dizziness, abdominal pain, skin rash, fevers, chills, night sweats, weight loss, swollen lymph nodes, body aches, joint swelling, chest pain, shortness of breath, mood changes. POSITIVE muscle aches ? ?Objective  ?Blood pressure 128/82, pulse 68, height 6\' 5"  (1.956 m), weight 252 lb (114.3 kg), SpO2 98 %. ?  ?General: No apparent distress alert and oriented x3 mood and affect normal, dressed appropriately.  ?HEENT: Pupils equal, extraocular movements intact  ?Respiratory: Patient's speak in full sentences and does not appear short of breath  ?Cardiovascular: No lower extremity edema, non tender, no erythema  ?Gait normal with good balance and coordination.  ?MSK: Patient's right knee does not have any significant swelling at the moment.  Patient does does still have the arthritic changes.  Mild tenderness noted on exam but no displacement difficult instability noted today. ? ?Left hip exam does have some tenderness to palpation over the greater trochanteric area.  Patient  does have a mild positive FABER test. ? ?After verbal consent patient was prepped with alcohol swab and with a 21-gauge 2 inch needle injected into the left greater trochanteric area with a total of 2 cc of 0.5% Marcaine and 1 cc of Kenalog 40 mg per.  No blood loss.  Postinjection instructions given.  Band-Aid placed ? ?  ?Impression and Recommendations:  ?  ? ?The above documentation has been reviewed and is accurate and complete , DO ? ? ? ?

## 2021-06-22 ENCOUNTER — Ambulatory Visit (INDEPENDENT_AMBULATORY_CARE_PROVIDER_SITE_OTHER): Payer: BC Managed Care – PPO | Admitting: Family Medicine

## 2021-06-22 DIAGNOSIS — M1711 Unilateral primary osteoarthritis, right knee: Secondary | ICD-10-CM

## 2021-06-22 DIAGNOSIS — M7062 Trochanteric bursitis, left hip: Secondary | ICD-10-CM | POA: Diagnosis not present

## 2021-06-22 NOTE — Assessment & Plan Note (Signed)
Patient given injection again today and tolerated the procedure well, discussed icing regimen and home exercises, discussed which activities to do which ones to avoid.  Differential includes lumbar radiculopathy and we will monitor.  Follow-up again in 6 to 8 weeks otherwise. ?

## 2021-06-22 NOTE — Patient Instructions (Signed)
Injected GT today ?See me again in 6-8 weeks ?

## 2021-06-22 NOTE — Assessment & Plan Note (Signed)
Significant improvement with the viscosupplementation at this point.  Discussed with patient to continue to monitor.  Patient can repeat if necessary.  Follow-up with me as needed for this problem ?

## 2021-06-30 ENCOUNTER — Other Ambulatory Visit (INDEPENDENT_AMBULATORY_CARE_PROVIDER_SITE_OTHER): Payer: Self-pay | Admitting: Adult Health

## 2021-06-30 ENCOUNTER — Ambulatory Visit (INDEPENDENT_AMBULATORY_CARE_PROVIDER_SITE_OTHER): Payer: BC Managed Care – PPO | Admitting: Adult Health

## 2021-06-30 ENCOUNTER — Encounter (INDEPENDENT_AMBULATORY_CARE_PROVIDER_SITE_OTHER): Payer: Self-pay | Admitting: Adult Health

## 2021-06-30 VITALS — BP 143/86 | HR 70 | Temp 97.7°F | Ht 77.0 in | Wt 246.0 lb

## 2021-06-30 DIAGNOSIS — Z6833 Body mass index (BMI) 33.0-33.9, adult: Secondary | ICD-10-CM

## 2021-06-30 DIAGNOSIS — E669 Obesity, unspecified: Secondary | ICD-10-CM

## 2021-06-30 DIAGNOSIS — E8881 Metabolic syndrome: Secondary | ICD-10-CM

## 2021-06-30 DIAGNOSIS — K5909 Other constipation: Secondary | ICD-10-CM | POA: Diagnosis not present

## 2021-06-30 MED ORDER — TIRZEPATIDE 7.5 MG/0.5ML ~~LOC~~ SOAJ
7.5000 mg | SUBCUTANEOUS | 0 refills | Status: DC
Start: 2021-06-30 — End: 2021-07-29

## 2021-06-30 NOTE — Telephone Encounter (Signed)
PA needed for [facility]

## 2021-07-01 ENCOUNTER — Encounter (INDEPENDENT_AMBULATORY_CARE_PROVIDER_SITE_OTHER): Payer: Self-pay

## 2021-07-01 ENCOUNTER — Telehealth (INDEPENDENT_AMBULATORY_CARE_PROVIDER_SITE_OTHER): Payer: Self-pay | Admitting: Adult Health

## 2021-07-01 NOTE — Telephone Encounter (Signed)
Prior authorization started for Mounjaro. Will notify patient and provider once response is received.  ?

## 2021-07-01 NOTE — Telephone Encounter (Signed)
Prior authorization denied for Mounjaro. Patient already uses copay card. Patient sent denial message via mychart. ?

## 2021-07-14 NOTE — Progress Notes (Signed)
? ? ? ?Chief Complaint:  ? ?OBESITY ?Seth Morales is here to discuss his progress with his obesity treatment plan along with follow-up of his obesity related diagnoses. Seth Morales is on PUSC and states he is following his eating plan approximately 75% of the time. Seth Morales states he is doing strength training and walking 35-40 minutes 3-4 times per week. ? ?Today's visit was #: 21 ?Starting weight: 285 ?Starting date: 05/22/2019 ?Today's weight: 246 ?Today's date: 06/30/2021 ?Total lbs lost to date: 50 ?Total lbs lost since last in-office visit: virtual visit, weight not recorded ? ?Interim History:  ?Seth Morales is on Mounjaro and injects on Fridays.   ?He started Monjaro 2.5 mg on 11/2020.   ?He has titrated up to Mounjaro 5mg - denies SE. ?Goal Weight-230 lbs ?Current Weight- 246 lbs. ? ?Subjective:  ? ?1. Insulin resistance ?He started Monjaro 2.5 mg on 11/2020.   ?He has titrated up to [facility] 5mg - denies SE. ?He injects on Fridays.   ? ?2. Other constipation ?Taboris is at increased risk for constipation due to inadequate water intake, changes in diet, and/or use of medications such as GLP1 agonists. Devoris denies hard, infrequent stools currently.  ?Seth Morales had bowel movements 3-4 per week with not feeling fully emptied.   ?Previous to [facility] he had daily bowel movements.   ?He denies any hematochezia or abdominal pain, he does endorse increase in bloating.   ? ?Assessment/Plan:  ? ?1. Insulin resistance ?RF and Increase Mounjaro to 7.5mg  once week. ? ?RF Mounjaro 7,5mg  once week, disp 78ml, RF 0 ? ?2. Other constipation ?Seth Morales agrees to over the counter Miralax per the manufacturers recommendation.  ? ?3. Obesity: Current BMI 29.2 ? ? ?Seth Morales is currently in the action stage of change. As such, his goal is to continue with weight loss efforts. He has agreed to [employer].  ? ?Exercise goals: As is ? ?Behavioral modification strategies: increasing lean protein intake, decreasing simple carbohydrates, meal planning and cooking  strategies, keeping healthy foods in the home, and planning for success. ? ?Seth Morales has agreed to follow-up with our clinic in 4 weeks. He was informed of the importance of frequent follow-up visits to maximize his success with intensive lifestyle modifications for his multiple health conditions.  ? ?Objective:  ? ?Blood pressure (!) 143/86, pulse 70, temperature 97.7 ?F (36.5 ?C), height 6\' 5"  (1.956 m), weight 246 lb (111.6 kg), SpO2 100 %. ?Body mass index is 29.17 kg/m?. ? ?General: Cooperative, alert, well developed, in no acute distress. ?HEENT: Conjunctivae and lids unremarkable. ?Cardiovascular: Regular rhythm.  ?Lungs: Normal work of breathing. ?Neurologic: No focal deficits.  ? ?Lab Results  ?Component Value Date  ? CREATININE 1.08 05/04/2021  ? BUN 22 05/04/2021  ? NA 137 05/04/2021  ? K 4.1 05/04/2021  ? CL 97 05/04/2021  ? CO2 25 05/04/2021  ? ?Lab Results  ?Component Value Date  ? ALT 27 05/04/2021  ? AST 31 05/04/2021  ? ALKPHOS 62 05/04/2021  ? BILITOT 0.7 05/04/2021  ? ?Lab Results  ?Component Value Date  ? HGBA1C 5.3 05/04/2021  ? HGBA1C 5.5 06/25/2020  ? HGBA1C 5.4 05/22/2019  ? HGBA1C 5.4 11/30/2018  ? HGBA1C 5.6 07/12/2017  ? ?Lab Results  ?Component Value Date  ? INSULIN 12.8 05/04/2021  ? INSULIN 8.5 06/25/2020  ? INSULIN 6.0 01/20/2020  ? INSULIN 14.6 05/22/2019  ? ?Lab Results  ?Component Value Date  ? TSH 2.970 05/22/2019  ? ?Lab Results  ?Component Value Date  ? CHOL 180 06/25/2020  ? HDL  51 06/25/2020  ? LDLCALC 109 (H) 06/25/2020  ? LDLDIRECT 114.0 03/14/2019  ? TRIG 113 06/25/2020  ? CHOLHDL 3.4 01/20/2020  ? ?Lab Results  ?Component Value Date  ? VD25OH 74.0 05/04/2021  ? VD25OH 61.9 06/25/2020  ? VD25OH 96.6 01/20/2020  ? ?Lab Results  ?Component Value Date  ? WBC 6.4 01/20/2020  ? HGB 14.8 01/20/2020  ? HCT 45.5 01/20/2020  ? MCV 93 01/20/2020  ? PLT 177 01/20/2020  ? ?No results found for: IRON, TIBC, FERRITIN ? ? ?Attestation Statements:  ? ?Reviewed by clinician on day of visit:  allergies, medications, problem list, medical history, surgical history, family history, social history, and previous encounter notes. ? ? ?I, Althea Charon, am acting as Location manager for Mina Marble, NP. ? ?I have reviewed the above documentation for accuracy and completeness, and I agree with the above. -  Vanda Waskey d. Suzanne Kho, NP-D ? ?

## 2021-07-29 ENCOUNTER — Ambulatory Visit (INDEPENDENT_AMBULATORY_CARE_PROVIDER_SITE_OTHER): Payer: BC Managed Care – PPO | Admitting: Adult Health

## 2021-07-29 ENCOUNTER — Encounter (INDEPENDENT_AMBULATORY_CARE_PROVIDER_SITE_OTHER): Payer: Self-pay | Admitting: Adult Health

## 2021-07-29 VITALS — BP 134/74 | HR 68 | Temp 97.6°F | Ht 77.0 in | Wt 245.0 lb

## 2021-07-29 DIAGNOSIS — E669 Obesity, unspecified: Secondary | ICD-10-CM

## 2021-07-29 DIAGNOSIS — E8881 Metabolic syndrome: Secondary | ICD-10-CM

## 2021-07-29 DIAGNOSIS — Q613 Polycystic kidney, unspecified: Secondary | ICD-10-CM

## 2021-07-29 DIAGNOSIS — Z6829 Body mass index (BMI) 29.0-29.9, adult: Secondary | ICD-10-CM | POA: Diagnosis not present

## 2021-07-29 DIAGNOSIS — Z7985 Long-term (current) use of injectable non-insulin antidiabetic drugs: Secondary | ICD-10-CM

## 2021-07-29 MED ORDER — TIRZEPATIDE 7.5 MG/0.5ML ~~LOC~~ SOAJ: 7.5000 mg | SUBCUTANEOUS | 0 refills | Status: DC

## 2021-07-30 ENCOUNTER — Encounter: Payer: Self-pay | Admitting: Family Medicine

## 2021-08-03 ENCOUNTER — Ambulatory Visit: Payer: Self-pay

## 2021-08-03 ENCOUNTER — Ambulatory Visit (INDEPENDENT_AMBULATORY_CARE_PROVIDER_SITE_OTHER): Payer: BC Managed Care – PPO

## 2021-08-03 ENCOUNTER — Ambulatory Visit: Payer: BC Managed Care – PPO | Admitting: Family Medicine

## 2021-08-03 ENCOUNTER — Encounter: Payer: Self-pay | Admitting: Family Medicine

## 2021-08-03 VITALS — BP 138/98 | HR 71 | Ht 77.0 in

## 2021-08-03 DIAGNOSIS — M545 Low back pain, unspecified: Secondary | ICD-10-CM

## 2021-08-03 DIAGNOSIS — M25552 Pain in left hip: Secondary | ICD-10-CM

## 2021-08-03 DIAGNOSIS — M19011 Primary osteoarthritis, right shoulder: Secondary | ICD-10-CM

## 2021-08-03 DIAGNOSIS — M7711 Lateral epicondylitis, right elbow: Secondary | ICD-10-CM

## 2021-08-03 DIAGNOSIS — M79641 Pain in right hand: Secondary | ICD-10-CM

## 2021-08-03 DIAGNOSIS — M7062 Trochanteric bursitis, left hip: Secondary | ICD-10-CM | POA: Diagnosis not present

## 2021-08-03 NOTE — Assessment & Plan Note (Signed)
Patient appears to be having more of a lateral epicondylitis.  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  We discussed avoiding extension wrist when possible and brace to wear at night discussed proper lifting mechanics.  Discussed which activities to do and which ones to avoid.  Increase activity slowly.  Follow-up again in 6 to 8 weeks

## 2021-08-03 NOTE — Assessment & Plan Note (Signed)
Mild exacerbation.  Discussed potentially repeating injection but wants to hold at this time.  Prescription dosing regimen.  Discussed proper technique for walking his dog.  Follow-up again in 6 to 8 weeks

## 2021-08-03 NOTE — Assessment & Plan Note (Signed)
Chronic problem with exacerbation again.  Attempted 1 more injection to see how patient responds.  Unfortunately if continuing to have difficulty I do feel that advanced imaging is warranted.  We will get x-rays to further evaluate to make sure there is no other abnormality that could be contributing.

## 2021-08-03 NOTE — Progress Notes (Signed)
Seth ScaleZach Morales D.O. [facility] [address] [place] [id number] Phone: [phone] Subjective:   Seth Morales, Seth Morales, am serving as a scribe for Dr. Antoine PrimasZachary Morales.   I'm seeing this patient by the request  of:  Pcp, No  CC: Hip pain, arm pain  AOZ:[id number]:Subjective  Seth NineRussell A Morales is a 61 y.o. male coming in with complaint of R hand pain. Patient states that since the fall in March he has had pain in R hand middle finger that radiates into forearm. Using brace at night.   Also injured R shoulder last night when he was pulling his dog back from front door. Pain in anterior aspect and can be sharp with certain movements.   Also pain in L hip is still the same after injection. GT injection last visit.       Past Medical History:  Diagnosis Date   Allergy    Anemia    Back pain    Bilateral swelling of feet    Blood transfusion without reported diagnosis 13 years ago    Chronic kidney disease    Dermatitis    Hypertension    Joint pain    Kidney transplanted    Vitamin D deficiency    Past Surgical History:  Procedure Laterality Date   COLONOSCOPY  09/06/2012   at [place]   EYE SURGERY     INSERTION OF DIALYSIS CATHETER     KIDNEY TRANSPLANT  2006   KNEE ARTHROPLASTY Bilateral    NEPHRECTOMY Bilateral    Social History   Socioeconomic History   Marital status: Married    Spouse name: Not on file   Number of children: Not on file   Years of education: Not on file   Highest education level: Not on file  Occupational History   Not on file  Tobacco Use   Smoking status: Never   Smokeless tobacco: Never  Vaping Use   Vaping Use: Never used  Substance and Sexual Activity   Alcohol use: Yes    Alcohol/week: 2.0 standard drinks    Types: 2 Standard drinks or equivalent per week   Drug use: No   Sexual activity: Not on file  Other Topics Concern   Not on file  Social History Narrative   Not on file   Social Determinants of Health   Financial  Resource Strain: Not on file  Food Insecurity: Not on file  Transportation Needs: Not on file  Physical Activity: Not on file  Stress: Not on file  Social Connections: Not on file   No Known Allergies Family History  Problem Relation Age of Onset   Arthritis Mother    Hypertension Mother    Kidney disease Mother    Diabetes Mother    Diabetes Father    Colon cancer Neg Hx    Colon polyps Neg Hx    Esophageal cancer Neg Hx    Rectal cancer Neg Hx    Stomach cancer Neg Hx    Pancreatic cancer Neg Hx     Current Outpatient Medications (Endocrine & Metabolic):    predniSONE (DELTASONE) 5 MG tablet, Take 1 tablet (5 mg total) by mouth daily with breakfast.   tirzepatide (MOUNJARO) 7.5 MG/0.5ML Pen, Inject 7.5 mg into the skin once a week.  Current Outpatient Medications (Cardiovascular):    hydrochlorothiazide (MICROZIDE) 12.5 MG capsule, Take 12.5 mg by mouth every other day.   Current Outpatient Medications (Analgesics):    allopurinol (ZYLOPRIM) 100 MG tablet,  Take 100 mg by mouth daily.   Current Outpatient Medications (Other):    Cholecalciferol (VITAMIN D) 125 MCG (5000 UT) CAPS, Take 5,000 Units by mouth daily.    cycloSPORINE modified (NEORAL) 100 MG capsule, Take 100 mg by mouth 2 (two) times daily.   cycloSPORINE modified (NEORAL) 25 MG capsule,    doxycycline (MONODOX) 100 MG capsule, Take 100 mg by mouth daily at 2 PM.   ketoconazole (NIZORAL) 2 % cream, Apply 1 application. topically 2 (two) times daily.   magnesium oxide-pyridoxine (BEELITH) 362-20 MG TABS, Take 1 tablet by mouth daily.   Multiple Vitamin (MULTI-VITAMIN) tablet, Take by mouth.   mycophenolate (CELLCEPT) 250 MG capsule, Take 750 mg by mouth 2 (two) times daily.   Omega-3 Fatty Acids (FISH OIL PO), Take by mouth.   polyethylene glycol (MIRALAX / GLYCOLAX) 17 g packet, Take 17 g by mouth daily as needed.   selenium sulfide (SELSUN) 2.5 % shampoo, Apply topically as directed.   Sulfacetamide  Sodium-Sulfur 8-4 % SUSP,    Reviewed prior external information including notes and imaging from  primary care provider As well as notes that were available from care everywhere and other healthcare systems.  Past medical history, social, surgical and family history all reviewed in electronic medical record.  No pertanent information unless stated regarding to the chief complaint.   Review of Systems:  No headache, visual changes, nausea, vomiting, diarrhea, constipation, dizziness, abdominal pain, skin rash, fevers, chills, night sweats, weight loss, swollen lymph nodes, body aches, joint swelling, chest pain, shortness of breath, mood changes. POSITIVE muscle aches  Objective  Blood pressure (!) 138/98, pulse 71, height 6\' 5"  (1.956 m), SpO2 98 %.   General: No apparent distress alert and oriented x3 mood and affect normal, dressed appropriately.  HEENT: Pupils equal, extraocular movements intact  Respiratory: Patient's speak in full sentences and does not appear short of breath  Cardiovascular: No lower extremity edema, non tender, no erythema  Gait mild antalgic MSK: Tender palpation over the right lateral epicondylar region.  Worsening pain with extension of the middle finger or even the wrist. Patient right shoulder mild positive crossover.  Mild pain over the acromioclavicular joint.  Rotator cuff strength does appear to be intact Left hip exam does have tenderness to palpation over the paraspinal musculature as well as over the greater trochanteric area.  Patient does have some skin noted.  Limited muscular skeletal ultrasound was performed and interpreted by , M  Limited ultrasound of patient's right elbow shows the patient does have some very mild enlargement of the common extensor tendon at the origin over the lateral epicondylar region.  Patient does have some very mild increase in Doppler flow in this area but no significant intersubstance tearing Impression:  Lateral epicondylitis   Procedure: Real-time Ultrasound Guided Injection of left  greater trochanteric bursitis secondary to patient's body habitus Device: GE Logiq Q7  Ultrasound guided injection is preferred based studies that show increased duration, increased effect, greater accuracy, decreased procedural pain, increased response rate, and decreased cost with ultrasound guided versus blind injection.  Verbal informed consent obtained.  Time-out conducted.  Noted no overlying erythema, induration, or other signs of local infection.  Skin prepped in a sterile fashion.  Local anesthesia: Topical Ethyl chloride.  With sterile technique and under real time ultrasound guidance:  Greater trochanteric area was visualized and patient's bursa was noted. A 22-gauge 3 inch needle was inserted and 4 cc of 0.5% Marcaine and 1 cc  of Kenalog 40 mg/dL was injected. Pictures taken Completed without difficulty  Pain immediately resolved suggesting accurate placement of the medication.  Advised to call if fevers/chills, erythema, induration, drainage, or persistent bleeding.  Impression: Technically successful ultrasound guided injection.    Impression and Recommendations:     The above documentation has been reviewed and is accurate and complete Judi Saa, DO

## 2021-08-03 NOTE — Patient Instructions (Signed)
Lumbar xray GT injection today R thumb spica splint at night Counter force brace for elbow Avoid wrist extension See me again in 6-8 weeks

## 2021-08-04 NOTE — Progress Notes (Signed)
Chief Complaint:   OBESITY Seth Morales is here to discuss his progress with his obesity treatment plan along with follow-up of his obesity related diagnoses. Seth Morales is on practicing portion control and making smarter food choices, such as increasing vegetables and decreasing simple carbohydrates and states he is following his eating plan approximately 80% of the time. Seth Morales states he is walking/going to the gym for 30 minutes 3-4 times per week.  Today's visit was #: 28 Starting weight: 285 Starting date: 05/22/2019 Today's weight: 245 lbs Today's date: 07/29/21 Total lbs lost to date: 40 lbs Total lbs lost since last in-office visit: -1  Interim History:  After nephrologist endorsed it-Mounjaro therapy started September 2022. Typical day of intake-morning: water, yogurt, perfect protein bar.   Afternoon: 4 ounces meat wrap, varies with cottage cheese.  Evening: Protein and vegetables. Still experiencing evening polyphagia around 9 PM.  Subjective:   1. Insulin resistance After nephrologist endorsed it-Mounjaro therapy started September 2022. He is still on Mounjaro 5 mg-he will increase Mounjaro to 7.5 mg tomorrow 07/30/2021.  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  Assessment/Plan:   1. Insulin resistance Start increased dose of Mounjaro 7.5 mg tomorrow 07/30/2021. Refill: - tirzepatide (MOUNJARO) 7.5 MG/0.5ML Pen; Inject 7.5 mg into the skin once a week.  Dispense: 6 mL; Refill: 0  2. Polycystic kidney disease Continue daily steroid use and follow-up with nephrology as directed.  3. Obesity: Current BMI 29.1 Seth Morales is currently in the action stage of change. As such, his goal is to continue with weight loss efforts. He has agreed to practicing portion  control and making smarter food choices, such as increasing vegetables and decreasing simple carbohydrates.   Exercise goals: as is.  Behavioral modification strategies: increasing lean protein intake, decreasing simple carbohydrates, meal planning and cooking strategies, keeping healthy foods in the home, and planning for success.  Seth Morales has agreed to follow-up with our clinic in 4 weeks. He was informed of the importance of frequent follow-up visits to maximize his success with intensive lifestyle modifications for his multiple health conditions.    Objective:   Blood pressure 134/74, pulse 68, temperature 97.6 F (36.4 C), height 6\' 5"  (1.956 m), weight 245 lb (111.1 kg), SpO2 99 %. Body mass index is 29.05 kg/m.  General: Cooperative, alert, well developed, in no acute distress. HEENT: Conjunctivae and lids unremarkable. Cardiovascular: Regular rhythm.  Lungs: Normal work of breathing. Neurologic: No focal deficits.   Lab Results  Component Value Date   CREATININE 1.08 05/04/2021   BUN 22 05/04/2021   NA 137 05/04/2021   K 4.1 05/04/2021   CL 97 05/04/2021   CO2 25 05/04/2021   Lab Results  Component Value Date   ALT 27 05/04/2021   AST 31 05/04/2021   ALKPHOS 62 05/04/2021   BILITOT 0.7 05/04/2021   Lab Results  Component Value Date   HGBA1C 5.3 05/04/2021   HGBA1C 5.5 06/25/2020   HGBA1C 5.4 05/22/2019   HGBA1C 5.4 11/30/2018   HGBA1C 5.6 07/12/2017   Lab Results  Component Value Date   INSULIN 12.8 05/04/2021   INSULIN 8.5 06/25/2020   INSULIN 6.0 01/20/2020   INSULIN 14.6 05/22/2019   Lab Results  Component Value Date   TSH 2.970 05/22/2019   Lab Results  Component Value Date   CHOL 180 06/25/2020   HDL 51 06/25/2020   LDLCALC 109 (H) 06/25/2020   LDLDIRECT 114.0 03/14/2019   TRIG 113 06/25/2020   CHOLHDL 3.4 01/20/2020   Lab Results  Component Value Date   VD25OH 74.0 05/04/2021   VD25OH 61.9 06/25/2020   VD25OH 96.6 01/20/2020   Lab  Results  Component Value Date   WBC 6.4 01/20/2020   HGB 14.8 01/20/2020   HCT 45.5 01/20/2020   MCV 93 01/20/2020   PLT 177 01/20/2020   No results found for: IRON, TIBC, FERRITIN  Attestation Statements:   Reviewed by clinician on day of visit: allergies, medications, problem list, medical history, surgical history, family history, social history, and previous encounter notes.  I, Georgianne Fick, FNP, am acting as Location manager for Mina Marble, NP.  I have reviewed the above documentation for accuracy and completeness, and I agree with the above. -  Jashay Roddy d. Huong Luthi, NP-C

## 2021-08-25 ENCOUNTER — Ambulatory Visit (INDEPENDENT_AMBULATORY_CARE_PROVIDER_SITE_OTHER): Payer: BC Managed Care – PPO | Admitting: Adult Health

## 2021-08-25 ENCOUNTER — Encounter (INDEPENDENT_AMBULATORY_CARE_PROVIDER_SITE_OTHER): Payer: Self-pay | Admitting: Adult Health

## 2021-08-25 VITALS — BP 147/87 | HR 77 | Temp 98.0°F | Ht 77.0 in | Wt 241.0 lb

## 2021-08-25 DIAGNOSIS — Z6828 Body mass index (BMI) 28.0-28.9, adult: Secondary | ICD-10-CM | POA: Diagnosis not present

## 2021-08-25 DIAGNOSIS — E669 Obesity, unspecified: Secondary | ICD-10-CM

## 2021-08-25 DIAGNOSIS — E8881 Metabolic syndrome: Secondary | ICD-10-CM | POA: Diagnosis not present

## 2021-08-26 NOTE — Progress Notes (Signed)
Chief Complaint:   OBESITY Seth Morales is here to discuss his progress with his obesity treatment plan along with follow-up of his obesity related diagnoses. Seth Morales is on practicing portion control and making smarter food choices, such as increasing vegetables and decreasing simple carbohydrates and states he is following his eating plan approximately 80% of the time. Seth Morales states he is cardio (bike), weights for 60 minutes-unknown how many times per week.  Today's visit was #: 29 Starting weight: 285 Starting date: 05/22/2019 Today's weight: 241 lbs Today's date: 08/25/21 Total lbs lost to date: 44 Total lbs lost since last in-office visit: -4  Interim History:  He has had 4 doses of Mounjaro 7.5 mg-2 episodes of hypoglycemia.   Last dose of Mounjaro 7.5 mg 08/20/2021.  He has 1 more box of Mounjaro 7.5 mg.  Current weight 241, goal weight 235 pounds.  Subjective:   1. Insulin resistance 05/04/2021 insulin level 12.8. He has had 4 doses of Mounjaro 7.5 mg-2 episodes of hypoglycemia (diaphoresis, elevated HR, nausea without vomiting). Last dose of Mounjaro 7.5 mg 08/20/2021.  He has 1 more box of Mounjaro 7.5 mg. He denies any acute SE at present.  Assessment/Plan:   1. Insulin resistance Remain off Mounjaro.  Follow portion control/smart choices plan. Check labs at next office visit.  2. Obesity: Current BMI 28.6 Stop Mounjaro, eat every few hours.  Seth Morales is currently in the action stage of change. As such, his goal is to continue with weight loss efforts. He has agreed to practicing portion control and making smarter food choices, such as increasing vegetables and decreasing simple carbohydrates.   Exercise goals: as is.  Behavioral modification strategies: increasing lean protein intake, decreasing simple carbohydrates, meal planning and cooking strategies, keeping healthy foods in the home, and planning for success.  Seth Morales has agreed to follow-up with our clinic in  5 weeks with IC and fasting labs.  He was informed of the importance of frequent follow-up visits to maximize his success with intensive lifestyle modifications for his multiple health conditions.    Objective:   Blood pressure (!) 147/87, pulse 77, temperature 98 F (36.7 C), height 6\' 5"  (1.956 m), weight 241 lb (109.3 kg), SpO2 98 %. Body mass index is 28.58 kg/m.  General: Cooperative, alert, well developed, in no acute distress. HEENT: Conjunctivae and lids unremarkable. Cardiovascular: Regular rhythm.  Lungs: Normal work of breathing. Neurologic: No focal deficits.   Lab Results  Component Value Date   CREATININE 1.08 05/04/2021   BUN 22 05/04/2021   NA 137 05/04/2021   K 4.1 05/04/2021   CL 97 05/04/2021   CO2 25 05/04/2021   Lab Results  Component Value Date   ALT 27 05/04/2021   AST 31 05/04/2021   ALKPHOS 62 05/04/2021   BILITOT 0.7 05/04/2021   Lab Results  Component Value Date   HGBA1C 5.3 05/04/2021   HGBA1C 5.5 06/25/2020   HGBA1C 5.4 05/22/2019   HGBA1C 5.4 11/30/2018   HGBA1C 5.6 07/12/2017   Lab Results  Component Value Date   INSULIN 12.8 05/04/2021   INSULIN 8.5 06/25/2020   INSULIN 6.0 01/20/2020   INSULIN 14.6 05/22/2019   Lab Results  Component Value Date   TSH 2.970 05/22/2019   Lab Results  Component Value Date   CHOL 180 06/25/2020   HDL 51 06/25/2020   LDLCALC 109 (H) 06/25/2020   LDLDIRECT 114.0 03/14/2019   TRIG 113 06/25/2020   CHOLHDL 3.4 01/20/2020   Lab Results  Component Value Date   VD25OH 74.0 05/04/2021   VD25OH 61.9 06/25/2020   VD25OH 96.6 01/20/2020   Lab Results  Component Value Date   WBC 6.4 01/20/2020   HGB 14.8 01/20/2020   HCT 45.5 01/20/2020   MCV 93 01/20/2020   PLT 177 01/20/2020   No results found for: "IRON", "TIBC", "FERRITIN"   Attestation Statements:   Reviewed by clinician on day of visit: allergies, medications, problem list, medical history, surgical history, family history, social  history, and previous encounter notes.  I, Seth Sans, FNP, am acting as Energy manager for Seth Hamburger, NP.  I have reviewed the above documentation for accuracy and completeness, and I agree with the above. -  Seth Teixeira d. [name], NP-C

## 2021-09-15 NOTE — Progress Notes (Unsigned)
[name] [address] [place] [address] Phone: [phone] Subjective:   Seth Morales, am serving as a scribe for Dr. Antoine Primas.   I'm seeing this patient by the request  of:  Pcp, No  CC:   [id number]  08/03/2021 Chronic problem with exacerbation again.  Attempted 1 more injection to see how patient responds.  Unfortunately if continuing to have difficulty I do feel that advanced imaging is warranted.  We will get x-rays to further evaluate to make sure there is no other abnormality that could be contributing.  Mild exacerbation.  Discussed potentially repeating injection but wants to hold at this time.  Prescription dosing regimen.  Discussed proper technique for walking his dog.  Follow-up again in 6 to 8 weeks  Patient appears to be having more of a lateral epicondylitis.  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  We discussed avoiding extension wrist when possible and brace to wear at night discussed proper lifting mechanics.  Discussed which activities to do and which ones to avoid.  Increase activity slowly.  Follow-up again in 6 to 8 weeks  Update 09/16/2021 Seth Morales is a 61 y.o. male coming in with complaint of R shoulder, R hand, R elbow, and L hip pain. Patient states that his shoulder pain has increased over past week. Notes grinding sensation. Pain mostly at night in [facility] joint.   Brace has helped wrist and R elbow.   Hip pain has also improved. Is working out in gym which has been helpful.   Believes that R knee has fluid buildup. Not as painful as it once was though.     Past Medical History:  Diagnosis Date   Allergy    Anemia    Back pain    Bilateral swelling of feet    Blood transfusion without reported diagnosis 13 years ago    Chronic kidney disease    Dermatitis    Hypertension    Joint pain    Kidney transplanted    Vitamin D deficiency    Past Surgical History:  Procedure Laterality  Date   COLONOSCOPY  09/06/2012   at [place]   EYE SURGERY     INSERTION OF DIALYSIS CATHETER     KIDNEY TRANSPLANT  2006   KNEE ARTHROPLASTY Bilateral    NEPHRECTOMY Bilateral    Social History   Socioeconomic History   Marital status: Married    Spouse name: Not on file   Number of children: Not on file   Years of education: Not on file   Highest education level: Not on file  Occupational History   Not on file  Tobacco Use   Smoking status: Never   Smokeless tobacco: Never  Vaping Use   Vaping Use: Never used  Substance and Sexual Activity   Alcohol use: Yes    Alcohol/week: 2.0 standard drinks of alcohol    Types: 2 Standard drinks or equivalent per week   Drug use: No   Sexual activity: Not on file  Other Topics Concern   Not on file  Social History Narrative   Not on file   Social Determinants of Health   Financial Resource Strain: Not on file  Food Insecurity: Not on file  Transportation Needs: Not on file  Physical Activity: Not on file  Stress: Not on file  Social Connections: Not on file   No Known Allergies Family History  Problem Relation Age of  Onset   Arthritis Mother    Hypertension Mother    Kidney disease Mother    Diabetes Mother    Diabetes Father    Colon cancer Neg Hx    Colon polyps Neg Hx    Esophageal cancer Neg Hx    Rectal cancer Neg Hx    Stomach cancer Neg Hx    Pancreatic cancer Neg Hx     Current Outpatient Medications (Endocrine & Metabolic):    predniSONE (DELTASONE) 5 MG tablet, Take 1 tablet (5 mg total) by mouth daily with breakfast.  Current Outpatient Medications (Cardiovascular):    hydrochlorothiazide (MICROZIDE) 12.5 MG capsule, Take 12.5 mg by mouth every other day.   Current Outpatient Medications (Analgesics):    allopurinol (ZYLOPRIM) 100 MG tablet, Take 100 mg by mouth daily.   Current Outpatient Medications (Other):    Cholecalciferol (VITAMIN D) 125 MCG (5000 UT) CAPS, Take 5,000 Units by mouth  daily.    cycloSPORINE modified (NEORAL) 100 MG capsule, Take 100 mg by mouth 2 (two) times daily.   cycloSPORINE modified (NEORAL) 25 MG capsule,    doxycycline (MONODOX) 100 MG capsule, Take 100 mg by mouth daily at 2 PM.   ketoconazole (NIZORAL) 2 % cream, Apply 1 application. topically 2 (two) times daily.   magnesium oxide-pyridoxine (BEELITH) 362-20 MG TABS, Take 1 tablet by mouth daily.   Multiple Vitamin (MULTI-VITAMIN) tablet, Take by mouth.   mycophenolate (CELLCEPT) 250 MG capsule, Take 750 mg by mouth 2 (two) times daily.   Omega-3 Fatty Acids (FISH OIL PO), Take by mouth.   selenium sulfide (SELSUN) 2.5 % shampoo, Apply topically as directed.   Sulfacetamide Sodium-Sulfur 8-4 % SUSP,    polyethylene glycol (MIRALAX / GLYCOLAX) 17 g packet, Take 17 g by mouth daily as needed.   Reviewed prior external information including notes and imaging from  primary care provider As well as notes that were available from care everywhere and other healthcare systems.  Past medical history, social, surgical and family history all reviewed in electronic medical record.  No pertanent information unless stated regarding to the chief complaint.   Review of Systems:  No headache, visual changes, nausea, vomiting, diarrhea, constipation, dizziness, abdominal pain, skin rash, fevers, chills, night sweats, weight loss, swollen lymph nodes, body aches, joint swelling, chest pain, shortness of breath, mood changes. POSITIVE muscle aches  Objective  Blood pressure 124/86, pulse 73, height 6\' 5"  (1.956 m), weight 255 lb (115.7 kg), SpO2 97 %.   General: No apparent distress alert and oriented x3 mood and affect normal, dressed appropriately.  HEENT: Pupils equal, extraocular movements intact  Respiratory: Patient's speak in full sentences and does not appear short of breath  Cardiovascular: No lower extremity edema, non tender, no erythema  Right shoulder exam shows the patient does have tightness  noted of the shoulder.  Positive impingement but 5 out of 5 strength of the rotator cuff.  Positive crossover.  Tenderness to palpation over the acromioclavicular joint. Right knee still does have arthritic changes noted.  Trace effusion noted  Procedure: Real-time Ultrasound Guided Injection of right acromioclavicular joint Device: GE Logiq Q7 Ultrasound guided injection is preferred based studies that show increased duration, increased effect, greater accuracy, decreased procedural pain, increased response rate, and decreased cost with ultrasound guided versus blind injection.  Verbal informed consent obtained.  Time-out conducted.  Noted no overlying erythema, induration, or other signs of local infection.  Skin prepped in a sterile fashion.  Local anesthesia: Topical Ethyl  chloride.  With sterile technique and under real time ultrasound guidance: With a 25-gauge half inch needle injecting 0.5 cc of 0.5% Marcaine and 0.5 cc of Kenalog 40 mg  Completed without difficulty  Pain immediately resolved suggesting accurate placement of the medication.  Advised to call if fevers/chills, erythema, induration, drainage, or persistent bleeding.  Impression: Technically successful ultrasound guided injection.    Impression and Recommendations:     The above documentation has been reviewed and is accurate and complete Judi Saa, DO

## 2021-09-16 ENCOUNTER — Ambulatory Visit: Payer: BC Managed Care – PPO | Admitting: Family Medicine

## 2021-09-16 ENCOUNTER — Ambulatory Visit: Payer: Self-pay

## 2021-09-16 ENCOUNTER — Encounter: Payer: Self-pay | Admitting: Family Medicine

## 2021-09-16 VITALS — BP 124/86 | HR 73 | Ht 77.0 in | Wt 255.0 lb

## 2021-09-16 DIAGNOSIS — M19011 Primary osteoarthritis, right shoulder: Secondary | ICD-10-CM

## 2021-09-16 DIAGNOSIS — M25511 Pain in right shoulder: Secondary | ICD-10-CM

## 2021-09-16 NOTE — Patient Instructions (Addendum)
Injected AC joint today We can watch fluid on R knee-minimal today See me again in 7-8 weeks

## 2021-09-16 NOTE — Assessment & Plan Note (Signed)
Patient given injection and tolerated the procedure well.  Chronic problem with exacerbation at this moment.  Patient does respond well to medical assistant.  Once again secondary to patient's other comorbidities such as 1 kidney decrease his other possible treatment options.  Follow-up with me again in 6 to 8 weeks

## 2021-09-29 ENCOUNTER — Ambulatory Visit (INDEPENDENT_AMBULATORY_CARE_PROVIDER_SITE_OTHER): Payer: BC Managed Care – PPO | Admitting: Adult Health

## 2021-09-29 ENCOUNTER — Encounter (INDEPENDENT_AMBULATORY_CARE_PROVIDER_SITE_OTHER): Payer: Self-pay | Admitting: Adult Health

## 2021-09-29 VITALS — BP 118/73 | HR 63 | Temp 98.1°F | Ht 77.0 in | Wt 247.0 lb

## 2021-09-29 DIAGNOSIS — E559 Vitamin D deficiency, unspecified: Secondary | ICD-10-CM | POA: Diagnosis not present

## 2021-09-29 DIAGNOSIS — R0602 Shortness of breath: Secondary | ICD-10-CM | POA: Diagnosis not present

## 2021-09-29 DIAGNOSIS — Z6829 Body mass index (BMI) 29.0-29.9, adult: Secondary | ICD-10-CM

## 2021-09-29 DIAGNOSIS — Z9189 Other specified personal risk factors, not elsewhere classified: Secondary | ICD-10-CM | POA: Insufficient documentation

## 2021-09-29 DIAGNOSIS — E669 Obesity, unspecified: Secondary | ICD-10-CM

## 2021-09-29 DIAGNOSIS — E8881 Metabolic syndrome: Secondary | ICD-10-CM | POA: Diagnosis not present

## 2021-09-29 DIAGNOSIS — I10 Essential (primary) hypertension: Secondary | ICD-10-CM

## 2021-09-30 LAB — COMPREHENSIVE METABOLIC PANEL
ALT: 33 IU/L (ref 0–44)
AST: 31 IU/L (ref 0–40)
Albumin/Globulin Ratio: 2 (ref 1.2–2.2)
Albumin: 4.1 g/dL (ref 3.8–4.9)
Alkaline Phosphatase: 68 IU/L (ref 44–121)
BUN/Creatinine Ratio: 21 (ref 10–24)
BUN: 23 mg/dL (ref 8–27)
Bilirubin Total: 0.9 mg/dL (ref 0.0–1.2)
CO2: 25 mmol/L (ref 20–29)
Calcium: 9.2 mg/dL (ref 8.6–10.2)
Chloride: 102 mmol/L (ref 96–106)
Creatinine, Ser: 1.12 mg/dL (ref 0.76–1.27)
Globulin, Total: 2.1 g/dL (ref 1.5–4.5)
Glucose: 97 mg/dL (ref 70–99)
Potassium: 4.1 mmol/L (ref 3.5–5.2)
Sodium: 143 mmol/L (ref 134–144)
Total Protein: 6.2 g/dL (ref 6.0–8.5)
eGFR: 75 mL/min/{1.73_m2} (ref >59)

## 2021-09-30 LAB — VITAMIN D 25 HYDROXY (VIT D DEFICIENCY, FRACTURES): Vit D, 25-Hydroxy: 67.7 ng/mL (ref 30.0–100.0)

## 2021-09-30 LAB — INSULIN, RANDOM: INSULIN: 7.1 u[IU]/mL (ref 2.6–24.9)

## 2021-09-30 LAB — HEMOGLOBIN A1C
Est. average glucose Bld gHb Est-mCnc: 108 mg/dL
Hgb A1c MFr Bld: 5.4 % (ref 4.8–5.6)

## 2021-09-30 IMAGING — DX DG KNEE COMPLETE 4+V*R*
4 series · 4 of 4 positions shown · non-contrast
Comparison: None.

CLINICAL DATA: Right chronic knee pain.

EXAM:
RIGHT KNEE - COMPLETE 4+ VIEW

[knee ap]
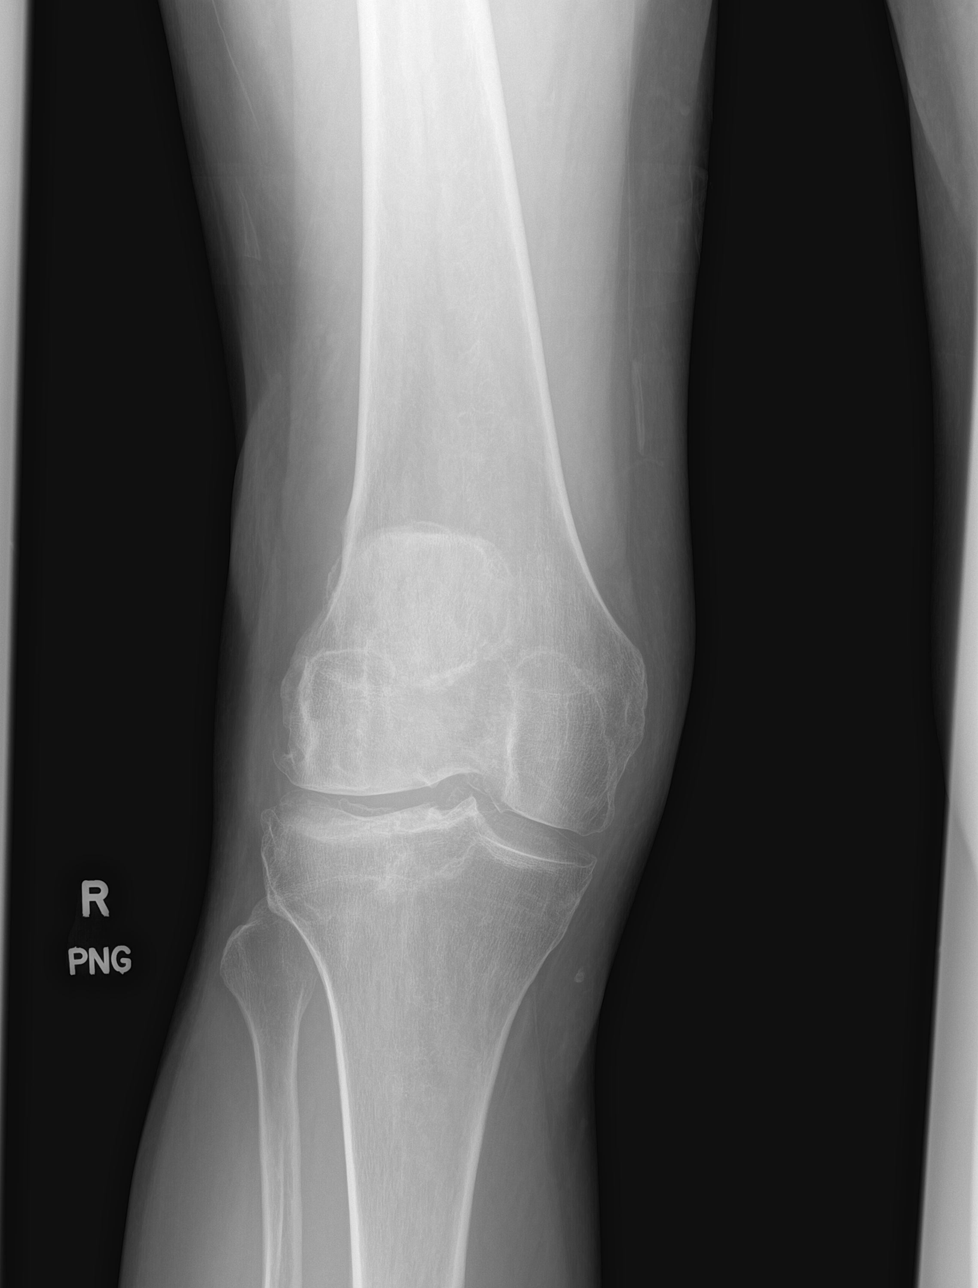

[knee obl (1 of 2)]
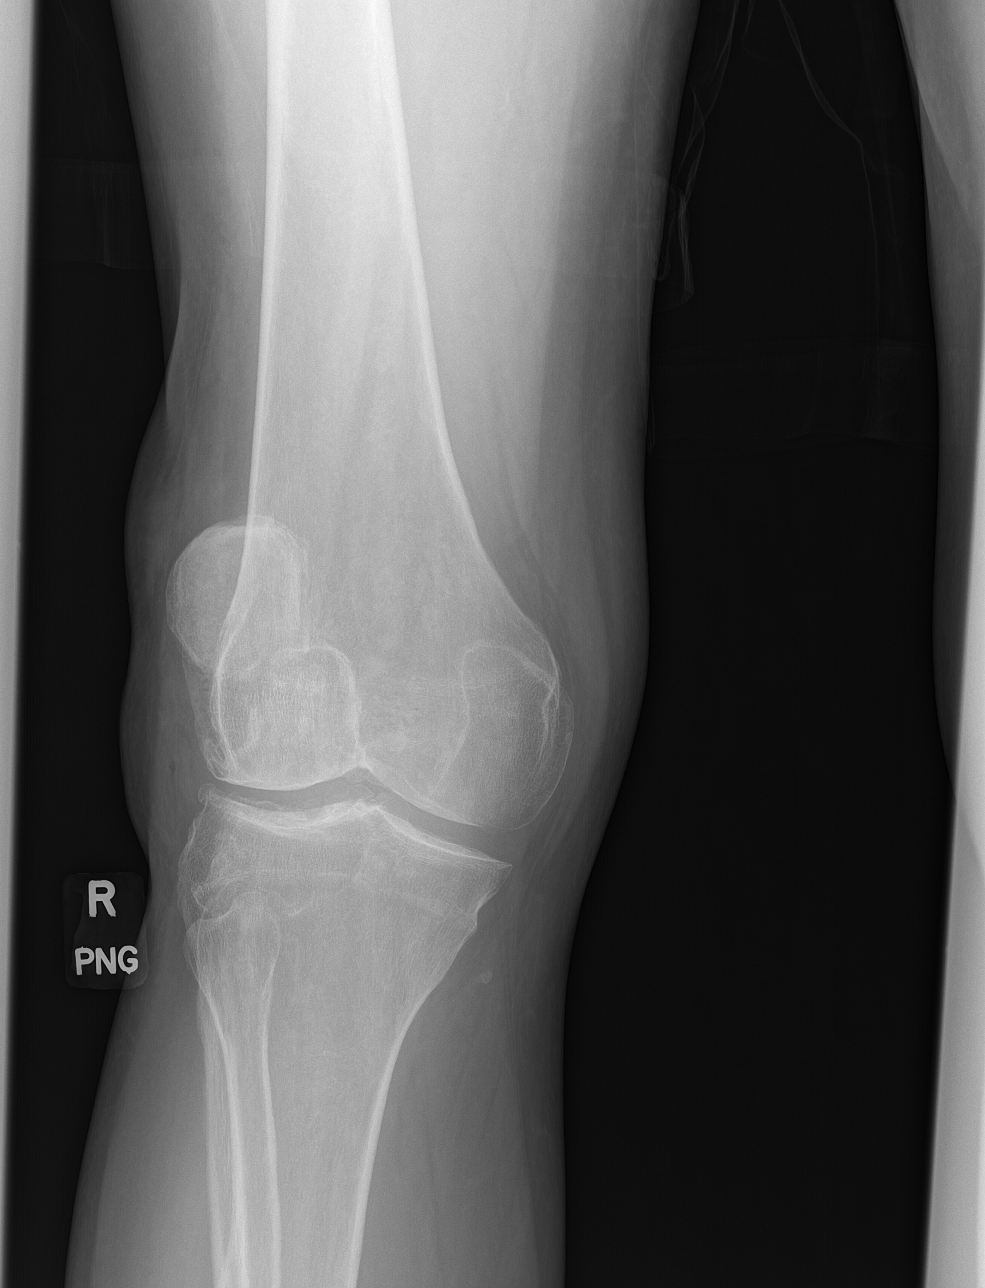

[knee obl (2 of 2)]
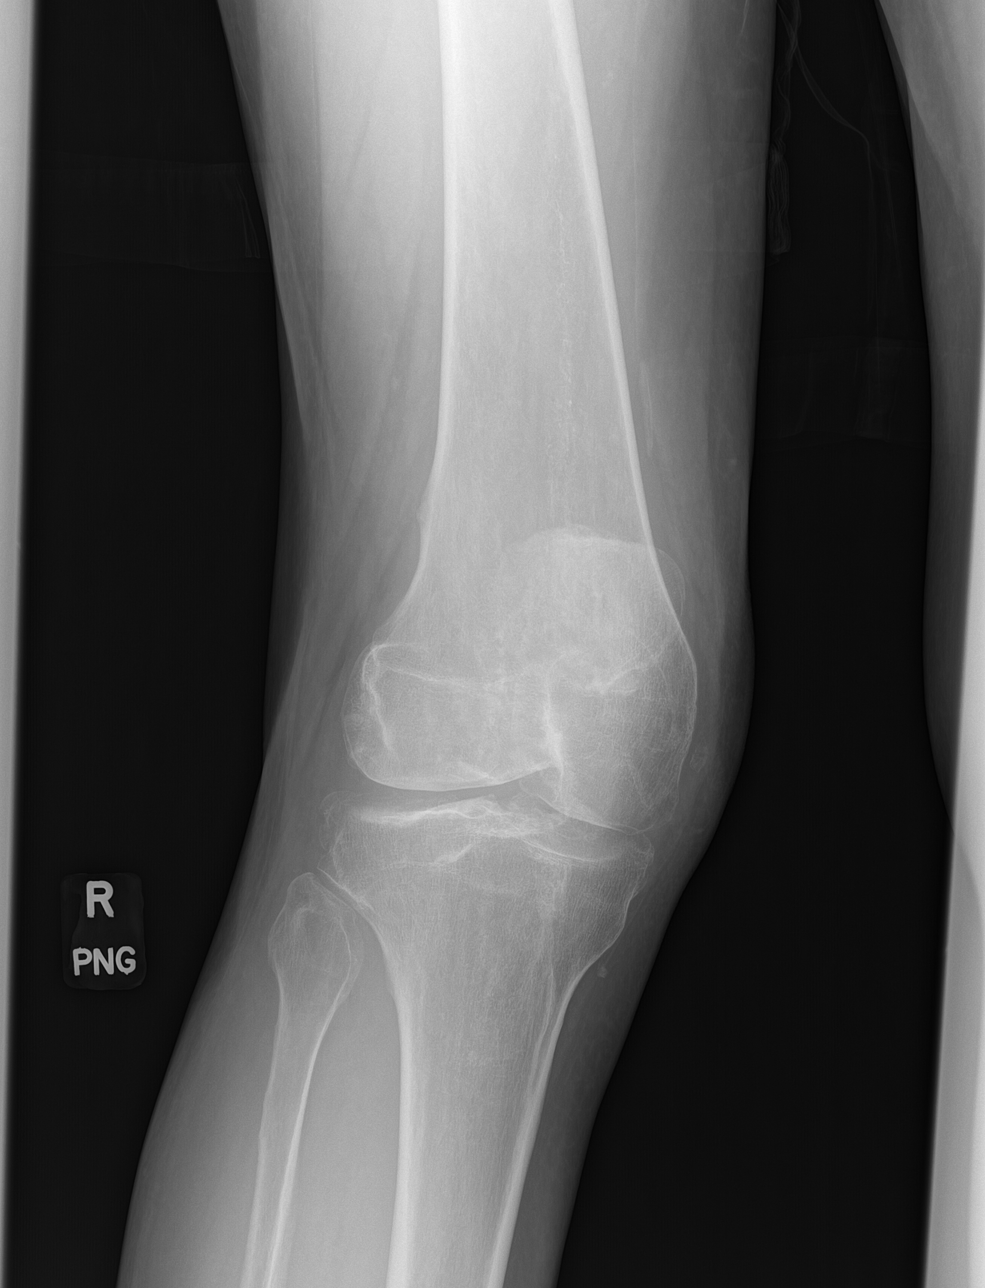

[knee lat]
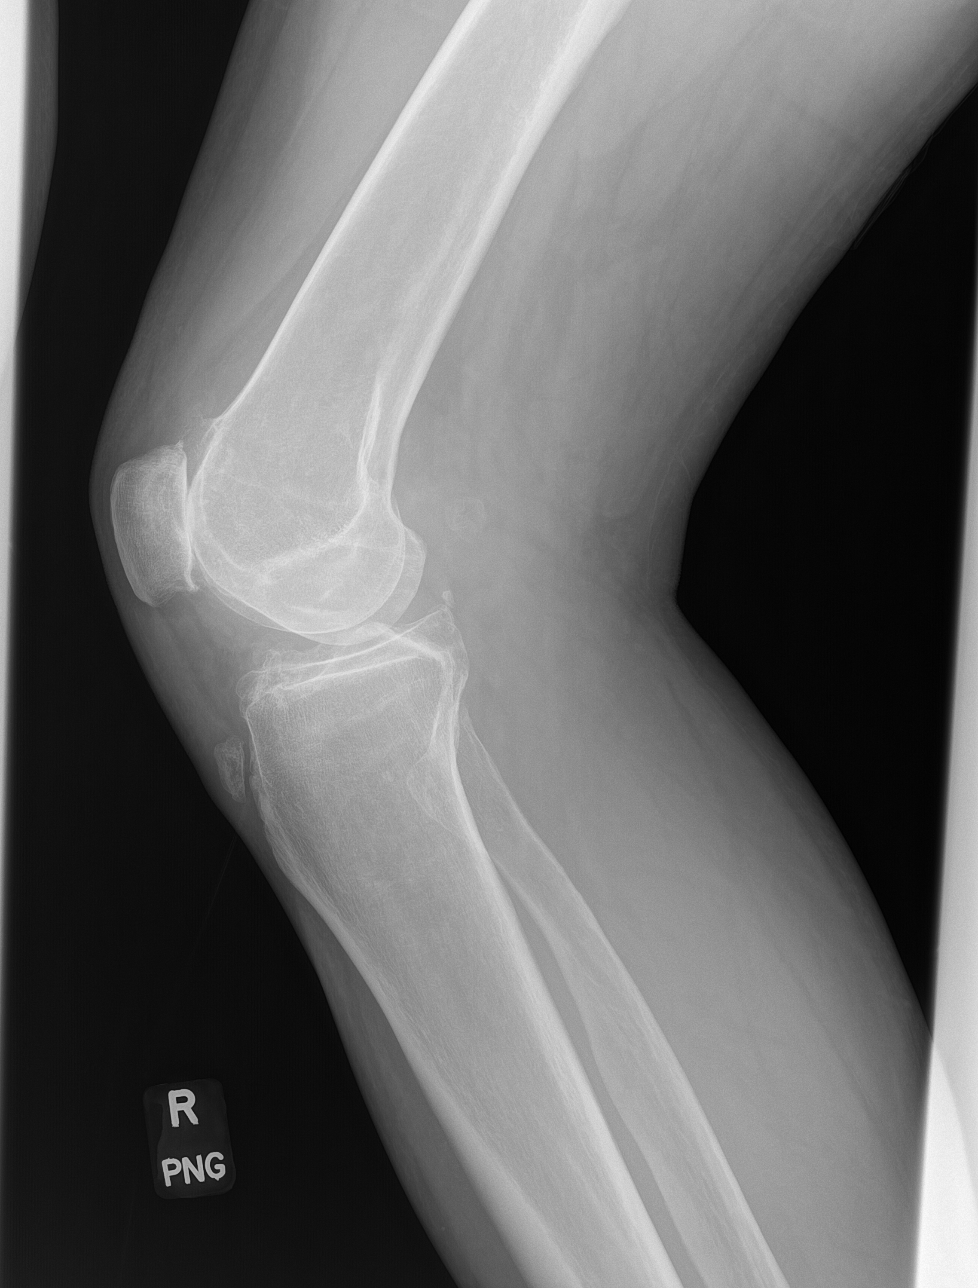

[4 of 4 positions shown; findings below may reference images not displayed]

FINDINGS: No acute fracture or dislocation. Tricompartmental degenerative
changes most marked in the lateral compartment. No joint effusion.
No acute fracture. No other acute abnormalities.
IMPRESSION: Tricompartmental degenerative changes, most prominent in the lateral
compartment.

## 2021-10-06 NOTE — Progress Notes (Signed)
Chief Complaint:   OBESITY Seth Morales is here to discuss his progress with his obesity treatment plan along with follow-up of his obesity related diagnoses. Seth Morales is on practicing portion control and making smarter food choices, such as increasing vegetables and decreasing simple carbohydrates and states he is following his eating plan approximately 50% of the time. Seth Morales states he is working out at [employer], walking 120 minutes 3 times per week.  Today's visit was #: 30 Starting weight: 285 Starting date: 05/22/2019 Today's weight: 247 lbs Today's date: 09/29/21 Total lbs lost to date: 38 Total lbs lost since last in-office visit: +6  Interim History:  Last week-conference-[place] with college friends.   He reports increased eating, increased alcohol, no exercise while at the conference.  Since stopping Mounjaro-regular bowel movements and denies sx's of  hypoglycemia.  Subjective:   1. SOBOE (shortness of breath on exertion) 05/22/2019 RMR 1862. RMR today 2045 Metabolism increased 185 cal/day.  2. Insulin resistance He has been off Mounjaro 7.5 mg-08/20/2021. Since off Moujaro therapy- he endorses regular bowel movements and denies sx's of  hypoglycemia  3. Vitamin D deficiency He is on OTC vitamin D3 5000 IU daily.  4. Essential hypertension He is on daily HCTZ 12.5 mg. He denies symptoms of hypotension. BP tiday 118/73, HR 63  5. At risk for complication associated with hypotension Related to steady weight loss.  On HCTZ 12.5 mg.   Assessment/Plan:   1. SOBOE (shortness of breath on exertion) Check IC.  2. Insulin resistance Check labs - Comprehensive metabolic panel - Hemoglobin A1c - Insulin, random  3. Vitamin D deficiency Check labs - VITAMIN D 25 Hydroxy (Vit-D Deficiency, Fractures)  4. Essential hypertension Remain well-hydrated.  5. At risk for complication associated with hypotension Seth Morales was given approximately 15  minutes of education and counseling today to help avoid hypotension. We discussed risks of hypotension with weight loss and signs of hypotension such as feeling lightheaded or unsteady.  Repetitive spaced learning was employed today to elicit superior memory formation and behavioral change.   6. Obesity: Current BMI 29.3 Seth Morales is currently in the action stage of change. As such, his goal is to continue with weight loss efforts. He has agreed to keeping a food journal and adhering to recommended goals of 1600 calories and 115 gms protein.   Exercise goals: as is.  Behavioral modification strategies: increasing lean protein intake, decreasing simple carbohydrates, meal planning and cooking strategies, keeping healthy foods in the home, and planning for success.  Seth Morales has agreed to follow-up with our clinic in 4 weeks. He was informed of the importance of frequent follow-up visits to maximize his success with intensive lifestyle modifications for his multiple health conditions.   Seth Morales was informed we would discuss his lab results at his next visit unless there is a critical issue that needs to be addressed sooner. Seth Morales agreed to keep his next visit at the agreed upon time to discuss these results.  Objective:   Blood pressure 118/73, pulse 63, temperature 98.1 F (36.7 C), height 6\' 5"  (1.956 m), weight 247 lb (112 kg), SpO2 99 %. Body mass index is 29.29 kg/m.  General: Cooperative, alert, well developed, in no acute distress. HEENT: Conjunctivae and lids unremarkable. Cardiovascular: Regular rhythm.  Lungs: Normal work of breathing. Neurologic: No focal deficits.   Lab Results  Component Value Date   CREATININE 1.12 09/29/2021   BUN 23 09/29/2021   NA 143 09/29/2021   K  4.1 09/29/2021   CL 102 09/29/2021   CO2 25 09/29/2021   Lab Results  Component Value Date   ALT 33 09/29/2021   AST 31 09/29/2021   ALKPHOS 68 09/29/2021   BILITOT 0.9 09/29/2021   Lab Results   Component Value Date   HGBA1C 5.4 09/29/2021   HGBA1C 5.3 05/04/2021   HGBA1C 5.5 06/25/2020   HGBA1C 5.4 05/22/2019   HGBA1C 5.4 11/30/2018   Lab Results  Component Value Date   INSULIN 7.1 09/29/2021   INSULIN 12.8 05/04/2021   INSULIN 8.5 06/25/2020   INSULIN 6.0 01/20/2020   INSULIN 14.6 05/22/2019   Lab Results  Component Value Date   TSH 2.970 05/22/2019   Lab Results  Component Value Date   CHOL 180 06/25/2020   HDL 51 06/25/2020   LDLCALC 109 (H) 06/25/2020   LDLDIRECT 114.0 03/14/2019   TRIG 113 06/25/2020   CHOLHDL 3.4 01/20/2020   Lab Results  Component Value Date   VD25OH 67.7 09/29/2021   VD25OH 74.0 05/04/2021   VD25OH 61.9 06/25/2020   Lab Results  Component Value Date   WBC 6.4 01/20/2020   HGB 14.8 01/20/2020   HCT 45.5 01/20/2020   MCV 93 01/20/2020   PLT 177 01/20/2020   No results found for: "IRON", "TIBC", "FERRITIN"   Attestation Statements:   Reviewed by clinician on day of visit: allergies, medications, problem list, medical history, surgical history, family history, social history, and previous encounter notes.  I, Jesse Sans, FNP, am acting as Energy manager for William Hamburger, NP.  I have reviewed the above documentation for accuracy and completeness, and I agree with the above. -  Wyatt Thorstenson d. Maanvi Lecompte, NP-C

## 2021-10-13 ENCOUNTER — Encounter (INDEPENDENT_AMBULATORY_CARE_PROVIDER_SITE_OTHER): Payer: Self-pay

## 2021-10-22 ENCOUNTER — Encounter: Payer: Self-pay | Admitting: Family Medicine

## 2021-10-26 ENCOUNTER — Ambulatory Visit (INDEPENDENT_AMBULATORY_CARE_PROVIDER_SITE_OTHER): Payer: BC Managed Care – PPO | Admitting: Adult Health

## 2021-10-26 ENCOUNTER — Encounter (INDEPENDENT_AMBULATORY_CARE_PROVIDER_SITE_OTHER): Payer: Self-pay | Admitting: Adult Health

## 2021-10-26 VITALS — BP 131/79 | HR 70 | Temp 97.6°F | Ht 77.0 in | Wt 258.0 lb

## 2021-10-26 DIAGNOSIS — R600 Localized edema: Secondary | ICD-10-CM | POA: Diagnosis not present

## 2021-10-26 DIAGNOSIS — Z6830 Body mass index (BMI) 30.0-30.9, adult: Secondary | ICD-10-CM

## 2021-10-26 DIAGNOSIS — E669 Obesity, unspecified: Secondary | ICD-10-CM | POA: Diagnosis not present

## 2021-10-27 ENCOUNTER — Ambulatory Visit (INDEPENDENT_AMBULATORY_CARE_PROVIDER_SITE_OTHER): Payer: BC Managed Care – PPO | Admitting: Adult Health

## 2021-10-28 NOTE — Progress Notes (Signed)
[name] [address] [place] [address] Phone: [phone] Subjective:   Seth Morales, am serving as a scribe for Dr. Antoine Primas.   CC: Right knee pain  [id number]  09/16/2021 Patient given injection and tolerated the procedure well.  Chronic problem with exacerbation at this moment.  Patient does respond well to medical assistant.  Once again secondary to patient's other comorbidities such as 1 kidney decrease his other possible treatment options.  Follow-up with me again in 6 to 8 weeks  Updated 10/29/2021 Seth Morales is a 61 y.o. male coming in with complaint of right knee pain. Patient states that he was doing a 30 day plank challenge. On day 12 he started having an increase in pain over the lateral aspect that radiates down the leg. Patient also feels like he is retaining fluid. Wakes up in morning and his knee feels like it is going to give out on him.   R shoulder pain has increased. Sleeps on that side and this make it worse.   L hip pain. Would like an injection in this area as pain has increased laterally.        Past Medical History:  Diagnosis Date   Allergy    Anemia    Back pain    Bilateral swelling of feet    Blood transfusion without reported diagnosis 13 years ago    Chronic kidney disease    Dermatitis    Hypertension    Joint pain    Kidney transplanted    Vitamin D deficiency    Past Surgical History:  Procedure Laterality Date   COLONOSCOPY  09/06/2012   at [place]   EYE SURGERY     INSERTION OF DIALYSIS CATHETER     KIDNEY TRANSPLANT  2006   KNEE ARTHROPLASTY Bilateral    NEPHRECTOMY Bilateral    Social History   Socioeconomic History   Marital status: Married    Spouse name: Not on file   Number of children: Not on file   Years of education: Not on file   Highest education level: Not on file  Occupational History   Not on file  Tobacco Use   Smoking status: Never   Smokeless  tobacco: Never  Vaping Use   Vaping Use: Never used  Substance and Sexual Activity   Alcohol use: Yes    Alcohol/week: 2.0 standard drinks of alcohol    Types: 2 Standard drinks or equivalent per week   Drug use: No   Sexual activity: Not on file  Other Topics Concern   Not on file  Social History Narrative   Not on file   Social Determinants of Health   Financial Resource Strain: Not on file  Food Insecurity: Not on file  Transportation Needs: Not on file  Physical Activity: Not on file  Stress: Not on file  Social Connections: Not on file   No Known Allergies Family History  Problem Relation Age of Onset   Arthritis Mother    Hypertension Mother    Kidney disease Mother    Diabetes Mother    Diabetes Father    Colon cancer Neg Hx    Colon polyps Neg Hx    Esophageal cancer Neg Hx    Rectal cancer Neg Hx    Stomach cancer Neg Hx    Pancreatic cancer Neg Hx     Current Outpatient Medications (Endocrine & Metabolic):    predniSONE (DELTASONE) 5 MG tablet,  Take 1 tablet (5 mg total) by mouth daily with breakfast.  Current Outpatient Medications (Cardiovascular):    hydrochlorothiazide (MICROZIDE) 12.5 MG capsule, Take 12.5 mg by mouth every other day.   Current Outpatient Medications (Analgesics):    allopurinol (ZYLOPRIM) 100 MG tablet, Take 100 mg by mouth daily.   Current Outpatient Medications (Other):    Cholecalciferol (VITAMIN D) 125 MCG (5000 UT) CAPS, Take 5,000 Units by mouth daily.    cycloSPORINE modified (NEORAL) 100 MG capsule, Take 100 mg by mouth 2 (two) times daily.   cycloSPORINE modified (NEORAL) 25 MG capsule,    doxycycline (MONODOX) 100 MG capsule, Take 100 mg by mouth daily at 2 PM.   ketoconazole (NIZORAL) 2 % cream, Apply 1 application. topically 2 (two) times daily.   magnesium oxide-pyridoxine (BEELITH) 362-20 MG TABS, Take 1 tablet by mouth daily.   Multiple Vitamin (MULTI-VITAMIN) tablet, Take by mouth.   mycophenolate (CELLCEPT)  250 MG capsule, Take 750 mg by mouth 2 (two) times daily.   Omega-3 Fatty Acids (FISH OIL PO), Take by mouth.   selenium sulfide (SELSUN) 2.5 % shampoo, Apply topically as directed.   Sulfacetamide Sodium-Sulfur 8-4 % SUSP,    Reviewed prior external information including notes and imaging from  primary care provider As well as notes that were available from care everywhere and other healthcare systems.  Past medical history, social, surgical and family history all reviewed in electronic medical record.  No pertanent information unless stated regarding to the chief complaint.   Review of Systems:  No headache, visual changes, nausea, vomiting, diarrhea, constipation, dizziness, abdominal pain, skin rash, fevers, chills, night sweats, weight loss, swollen lymph nodes, body aches, joint swelling, chest pain, shortness of breath, mood changes. POSITIVE muscle aches  Objective  Blood pressure 128/84, pulse 71, height 6\' 5"  (1.956 m), weight 261 lb (118.4 kg), SpO2 97 %.   General: No apparent distress alert and oriented x3 mood and affect normal, dressed appropriately.  HEENT: Pupils equal, extraocular movements intact  Respiratory: Patient's speak in full sentences and does not appear short of breath  Cardiovascular: No lower extremity edema, non tender, no erythema  Patient does have an antalgic gait noted.  Favoring the right knee.  Tender to palpation in this area. Patient does have some limited range of motion of the knee noted as well. Severely tender to palpation over the left greater trochanteric area.  After informed written and verbal consent, patient was seated on exam table. Right knee was prepped with alcohol swab and utilizing anterolateral approach, patient's right knee space was injected with 4:1  marcaine 0.5%: Kenalog 40mg /dL. Patient tolerated the procedure well without immediate complications.   Procedure: Real-time Ultrasound Guided Injection of left  greater  trochanteric bursitis secondary to patient's body habitus Device: GE Logiq Q7  Ultrasound guided injection is preferred based studies that show increased duration, increased effect, greater accuracy, decreased procedural pain, increased response rate, and decreased cost with ultrasound guided versus blind injection.  Verbal informed consent obtained.  Time-out conducted.  Noted no overlying erythema, induration, or other signs of local infection.  Skin prepped in a sterile fashion.  Local anesthesia: Topical Ethyl chloride.  With sterile technique and under real time ultrasound guidance:  Greater trochanteric area was visualized and patient's bursa was noted. A 22-gauge 3 inch needle was inserted and 2 cc of 0.5% Marcaine and 1 cc of Kenalog 40 mg/dL was injected. Pictures taken Completed without difficulty  Pain immediately resolved suggesting accurate placement  of the medication.  Advised to call if fevers/chills, erythema, induration, drainage, or persistent bleeding.  Impression: Technically successful ultrasound guided injection.    Impression and Recommendations:    The above documentation has been reviewed and is accurate and complete Judi Saa, DO

## 2021-10-29 ENCOUNTER — Ambulatory Visit: Payer: Self-pay

## 2021-10-29 ENCOUNTER — Encounter: Payer: Self-pay | Admitting: Family Medicine

## 2021-10-29 ENCOUNTER — Ambulatory Visit: Payer: BC Managed Care – PPO | Admitting: Family Medicine

## 2021-10-29 VITALS — BP 128/84 | HR 71 | Ht 77.0 in | Wt 261.0 lb

## 2021-10-29 DIAGNOSIS — M1711 Unilateral primary osteoarthritis, right knee: Secondary | ICD-10-CM | POA: Diagnosis not present

## 2021-10-29 DIAGNOSIS — M7062 Trochanteric bursitis, left hip: Secondary | ICD-10-CM | POA: Diagnosis not present

## 2021-10-29 DIAGNOSIS — M25511 Pain in right shoulder: Secondary | ICD-10-CM

## 2021-10-29 NOTE — Assessment & Plan Note (Signed)
No worsening symptoms and given an injection again today.  Chronic problem with exacerbation.  Discussed icing regimen, home exercises, core strengthening and hip abductor strengthening.  Follow-up again in 6 to 8 weeks

## 2021-10-29 NOTE — Assessment & Plan Note (Signed)
Worsening right knee pain.  He does have arthritic changes.  I believe that the lateral compartment with him doing the exercises seem to get significantly worse again.  Discussed icing regimen and home exercises.  Discussed the possible need for viscosupplementation.  Increase activity slowly otherwise.  Follow-up with me again 6 to 8 weeks.

## 2021-10-29 NOTE — Patient Instructions (Signed)
Injected the hip and the knee

## 2021-10-29 NOTE — Progress Notes (Unsigned)
Chief Complaint:   OBESITY Seth Morales is here to discuss his progress with his obesity treatment plan along with follow-up of his obesity related diagnoses. Seth Morales is on keeping a food journal and adhering to recommended goals of 1600 calories and 115 protein and states he is following his eating plan approximately 50% of the time. Seth Morales states he is walking, but hurt his back and knee 20 minutes 7 times per week.  Today's visit was #: 31 Starting weight: 285 lbs Starting date: 05/22/2019 Today's weight: 258 lbs Today's date: 10/26/2021 Total lbs lost to date: 27 lbs Total lbs lost since last in-office visit: +11  Interim History: 30 day plank challenge, day 12- 90 seconds caused back pain.  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  Subjective:   1. Bilateral lower extremity edema He reports of bilateral lower extremity edema. Nephrology provides HCTZ 12.5 mg every other day.  He denies dyspnea or cough.  Bioimpedance reflects water weight +11.2.    Assessment/Plan:   1. Bilateral lower extremity edema Increase water, limit sodium, continue daily HCTZ per Nephrology.  2. Obesity: Current BMI 30.6 Handout:  Eating out guide.  Seth Morales is currently in the action stage of change. As such, his goal is to continue with weight loss efforts. He has agreed to change to the Category 3 Plan.   Exercise goals:  Advance activity as tolerated.  Behavioral modification strategies: increasing lean protein intake, decreasing simple carbohydrates, meal planning and cooking strategies, keeping healthy foods in the home, and planning for success.  Seth Morales has agreed to follow-up with our clinic in 4 weeks. He was informed of the importance of frequent follow-up visits to maximize his success with intensive lifestyle modifications for his multiple health conditions.   Objective:   Blood pressure 131/79, pulse 70, temperature 97.6 F (36.4 C), height 6\' 5"   (1.956 m), weight 258 lb (117 kg), SpO2 97 %. Body mass index is 30.59 kg/m.  General: Cooperative, alert, well developed, in no acute distress. HEENT: Conjunctivae and lids unremarkable. Cardiovascular: Regular rhythm.  Lungs: Normal work of breathing. Neurologic: No focal deficits.   Lab Results  Component Value Date   CREATININE 1.12 09/29/2021   BUN 23 09/29/2021   NA 143 09/29/2021   K 4.1 09/29/2021   CL 102 09/29/2021   CO2 25 09/29/2021   Lab Results  Component Value Date   ALT 33 09/29/2021   AST 31 09/29/2021   ALKPHOS 68 09/29/2021   BILITOT 0.9 09/29/2021   Lab Results  Component Value Date   HGBA1C 5.4 09/29/2021   HGBA1C 5.3 05/04/2021   HGBA1C 5.5 06/25/2020   HGBA1C 5.4 05/22/2019   HGBA1C 5.4 11/30/2018   Lab Results  Component Value Date   INSULIN 7.1 09/29/2021   INSULIN 12.8 05/04/2021   INSULIN 8.5 06/25/2020   INSULIN 6.0 01/20/2020   INSULIN 14.6 05/22/2019   Lab Results  Component Value Date   TSH 2.970 05/22/2019   Lab Results  Component Value Date   CHOL 180 06/25/2020   HDL 51 06/25/2020   LDLCALC 109 (H) 06/25/2020   LDLDIRECT 114.0 03/14/2019   TRIG 113 06/25/2020   CHOLHDL 3.4 01/20/2020   Lab Results  Component Value Date   VD25OH 67.7 09/29/2021   VD25OH 74.0 05/04/2021   VD25OH 61.9 06/25/2020   Lab Results  Component Value Date   WBC 6.4 01/20/2020   HGB 14.8 01/20/2020   HCT 45.5 01/20/2020  MCV 93 01/20/2020   PLT 177 01/20/2020   No results found for: "IRON", "TIBC", "FERRITIN"  Attestation Statements:   Reviewed by clinician on day of visit: allergies, medications, problem list, medical history, surgical history, family history, social history, and previous encounter notes.  I, Malcolm Metro, RMA, am acting as Energy manager for William Hamburger, NP.  I have reviewed the above documentation for accuracy and completeness, and I agree with the above. -  ***

## 2021-11-02 DIAGNOSIS — R600 Localized edema: Secondary | ICD-10-CM | POA: Insufficient documentation

## 2021-11-10 ENCOUNTER — Ambulatory Visit: Payer: BC Managed Care – PPO | Admitting: Family Medicine

## 2021-11-30 ENCOUNTER — Ambulatory Visit (INDEPENDENT_AMBULATORY_CARE_PROVIDER_SITE_OTHER): Payer: BC Managed Care – PPO | Admitting: Adult Health

## 2021-12-30 ENCOUNTER — Ambulatory Visit (INDEPENDENT_AMBULATORY_CARE_PROVIDER_SITE_OTHER): Payer: BC Managed Care – PPO | Admitting: Adult Health

## 2022-01-07 ENCOUNTER — Encounter: Payer: Self-pay | Admitting: Family Medicine

## 2022-01-11 ENCOUNTER — Telehealth: Payer: Self-pay | Admitting: *Deleted

## 2022-01-11 NOTE — Telephone Encounter (Signed)
R knee Orthovisc pre-cert initiated.

## 2022-01-13 NOTE — Telephone Encounter (Signed)
R knee Orthovisc approved 01/11/2022 - 07/09/2022.

## 2022-01-19 NOTE — Progress Notes (Signed)
Seth Morales [address] [place] [address] Phone: [phone] Subjective:   Seth Morales, am serving as a scribe for Seth Morales. I'm seeing this patient by the request  of:  Seth Morales, No  CC: Right knee pain  [id number]  10/29/2021 No worsening symptoms and given an injection again today.  Chronic problem with exacerbation.  Discussed icing regimen, home exercises, core strengthening and hip abductor strengthening.  Follow-up again in 6 to 8 weeks    Update 01/21/2022 Seth Morales is a 61 y.o. male coming in with complaint of R knee pain. Patient states that he has had sudden onset of knee pain throughout the joint. No new injury. Feels unsteady in the mornings and notices popping and creaking more than usual.   Also having increase in L GT with stair climbing.   Shoulder pain has improved.       Past Medical History:  Diagnosis Date   Allergy    Anemia    Back pain    Bilateral swelling of feet    Blood transfusion without reported diagnosis 13 years ago    Chronic kidney disease    Dermatitis    Hypertension    Joint pain    Kidney transplanted    Vitamin D deficiency    Past Surgical History:  Procedure Laterality Date   COLONOSCOPY  09/06/2012   at [place]   EYE SURGERY     INSERTION OF DIALYSIS CATHETER     KIDNEY TRANSPLANT  2006   KNEE ARTHROPLASTY Bilateral    NEPHRECTOMY Bilateral    Social History   Socioeconomic History   Marital status: Married    Spouse name: Not on file   Number of children: Not on file   Years of education: Not on file   Highest education level: Not on file  Occupational History   Not on file  Tobacco Use   Smoking status: Never   Smokeless tobacco: Never  Vaping Use   Vaping Use: Never used  Substance and Sexual Activity   Alcohol use: Yes    Alcohol/week: 2.0 standard drinks of alcohol    Types: 2 Standard drinks or equivalent per week   Drug use: No   Sexual  activity: Not on file  Other Topics Concern   Not on file  Social History Narrative   Not on file   Social Determinants of Health   Financial Resource Strain: Not on file  Food Insecurity: Not on file  Transportation Needs: Not on file  Physical Activity: Not on file  Stress: Not on file  Social Connections: Not on file   No Known Allergies Family History  Problem Relation Age of Onset   Arthritis Mother    Hypertension Mother    Kidney disease Mother    Diabetes Mother    Diabetes Father    Colon cancer Neg Hx    Colon polyps Neg Hx    Esophageal cancer Neg Hx    Rectal cancer Neg Hx    Stomach cancer Neg Hx    Pancreatic cancer Neg Hx     Current Outpatient Medications (Endocrine & Metabolic):    predniSONE (DELTASONE) 5 MG tablet, Take 1 tablet (5 mg total) by mouth daily with breakfast.  Current Outpatient Medications (Cardiovascular):    hydrochlorothiazide (MICROZIDE) 12.5 MG capsule, Take 12.5 mg by mouth every other day.   Current Outpatient Medications (Analgesics):    allopurinol (ZYLOPRIM) 100 MG tablet, Take  100 mg by mouth daily.   Current Outpatient Medications (Other):    Cholecalciferol (VITAMIN D) 125 MCG (5000 UT) CAPS, Take 5,000 Units by mouth daily.    cycloSPORINE modified (NEORAL) 100 MG capsule, Take 100 mg by mouth 2 (two) times daily.   cycloSPORINE modified (NEORAL) 25 MG capsule,    doxycycline (MONODOX) 100 MG capsule, Take 100 mg by mouth daily at 2 PM.   ketoconazole (NIZORAL) 2 % cream, Apply 1 application. topically 2 (two) times daily.   magnesium oxide-pyridoxine (BEELITH) 362-20 MG TABS, Take 1 tablet by mouth daily.   Multiple Vitamin (MULTI-VITAMIN) tablet, Take by mouth.   mycophenolate (CELLCEPT) 250 MG capsule, Take 750 mg by mouth 2 (two) times daily.   Omega-3 Fatty Acids (FISH OIL PO), Take by mouth.   selenium sulfide (SELSUN) 2.5 % shampoo, Apply topically as directed.   Sulfacetamide Sodium-Sulfur 8-4 % SUSP,     Reviewed prior external information including notes and imaging from  primary care provider As well as notes that were available from care everywhere and other healthcare systems.  Past medical history, social, surgical and family history all reviewed in electronic medical record.  No pertanent information unless stated regarding to the chief complaint.   Review of Systems:  No headache, visual changes, nausea, vomiting, diarrhea, constipation, dizziness, abdominal pain, skin rash, fevers, chills, night sweats, weight loss, swollen lymph nodes, body aches, joint swelling, chest pain, shortness of breath, mood changes. POSITIVE muscle aches  Objective  Blood pressure 132/86, pulse (!) 59, height 6\' 5"  (1.956 m), SpO2 98 %.   General: No apparent distress alert and oriented x3 mood and affect normal, dressed appropriately.  HEENT: Pupils equal, extraocular movements intact  Respiratory: Patient's speak in full sentences and does not appear short of breath  Cardiovascular: No lower extremity edema, non tender, no erythema  Right knee exam does have some instability noted.  Valgus force instability noted.  Patient does have swelling of the patellofemoral joint and lacks the last 5 degrees of flexion and extension. Left hip does have tenderness to palpation more over the greater trochanteric area again.  Mild positive FABER test.  No pain with internal range of motion.  After informed written and verbal consent, patient was seated on exam table. Right knee was prepped with alcohol swab and utilizing anterolateral approach, patient's right knee space was injected with15 mg/2.5 mL of Orthovisc(sodium hyaluronate) in a prefilled syringe was injected easily into the knee through a 22-gauge needle..Patient tolerated the procedure well without immediate complications.   Procedure: Real-time Ultrasound Guided Injection of left  greater trochanteric bursitis secondary to patient's body  habitus Device: GE Logiq Q7  Ultrasound guided injection is preferred based studies that show increased duration, increased effect, greater accuracy, decreased procedural pain, increased response rate, and decreased cost with ultrasound guided versus blind injection.  Verbal informed consent obtained.  Time-out conducted.  Noted no overlying erythema, induration, or other signs of local infection.  Skin prepped in a sterile fashion.  Local anesthesia: Topical Ethyl chloride.  With sterile technique and under real time ultrasound guidance:  Greater trochanteric area was visualized and patient's bursa was noted. A 22-gauge 3 inch needle was inserted and 4 cc of 0.5% Marcaine and 1 cc of Kenalog 40 mg/dL was injected. Pictures taken Completed without difficulty  Pain immediately resolved suggesting accurate placement of the medication.  Advised to call if fevers/chills, erythema, induration, drainage, or persistent bleeding.  Impression: Technically successful ultrasound guided injection.  Impression and Recommendations:

## 2022-01-21 ENCOUNTER — Ambulatory Visit: Payer: BC Managed Care – PPO | Admitting: Family Medicine

## 2022-01-21 ENCOUNTER — Encounter: Payer: Self-pay | Admitting: Family Medicine

## 2022-01-21 ENCOUNTER — Ambulatory Visit: Payer: Self-pay

## 2022-01-21 VITALS — BP 132/86 | HR 59 | Ht 77.0 in

## 2022-01-21 DIAGNOSIS — M25561 Pain in right knee: Secondary | ICD-10-CM

## 2022-01-21 DIAGNOSIS — M7062 Trochanteric bursitis, left hip: Secondary | ICD-10-CM

## 2022-01-21 DIAGNOSIS — M1711 Unilateral primary osteoarthritis, right knee: Secondary | ICD-10-CM

## 2022-01-21 DIAGNOSIS — G8929 Other chronic pain: Secondary | ICD-10-CM | POA: Diagnosis not present

## 2022-01-21 MED ORDER — HYALURONAN 30 MG/2ML IX SOSY
30.0000 mg | PREFILLED_SYRINGE | Freq: Once | INTRA_ARTICULAR | Status: AC
  Administered 2022-01-21: 30 mg via INTRA_ARTICULAR

## 2022-01-21 NOTE — Patient Instructions (Signed)
Injected hip today Orthovisc to R knee See me after [name] for next OrthoVisc

## 2022-01-21 NOTE — Assessment & Plan Note (Addendum)
Chronic problem with exacerbation.  Worsening pain.  Known arthritic changes.  Discussed icing regimen and home exercises, discussed which activities to do and which ones to avoid.  Increase activity slowly otherwise.  Follow-up again for second in a series of 4 injections for viscosupplementation in the next 1 to 2 weeks secondary to the holiday.

## 2022-01-21 NOTE — Assessment & Plan Note (Signed)
Worsening symptoms again.  Given injection and hopefully patient tolerated well.  Discussed icing regimen and home exercises and hip abductor strengthening.  We will follow-up again in 6 to 8 weeks.

## 2022-02-02 NOTE — Progress Notes (Unsigned)
  [name] [address] [place] [address] Phone: [phone] Subjective:   Seth Morales, am serving as a scribe for Dr. Antoine Primas.  I'm seeing this patient by the request  of:  Pcp, No  CC: Right knee pain follow-up  [id number]    Update 02/03/2022 Seth Morales is a 61 y.o. male coming in with complaint of B knee and L hip pain. Here for Orthovisc #2. Patient states that he could tell there was a difference day two after the injection.       Past Medical History:  Diagnosis Date   Allergy    Anemia    Back pain    Bilateral swelling of feet    Blood transfusion without reported diagnosis 13 years ago    Chronic kidney disease    Dermatitis    Hypertension    Joint pain    Kidney transplanted    Vitamin D deficiency    Past Surgical History:  Procedure Laterality Date   COLONOSCOPY  09/06/2012   at [place]   EYE SURGERY     INSERTION OF DIALYSIS CATHETER     KIDNEY TRANSPLANT  2006   KNEE ARTHROPLASTY Bilateral    NEPHRECTOMY Bilateral     Family History  Problem Relation Age of Onset   Arthritis Mother    Hypertension Mother    Kidney disease Mother    Diabetes Mother    Diabetes Father    Colon cancer Neg Hx    Colon polyps Neg Hx    Esophageal cancer Neg Hx    Rectal cancer Neg Hx    Stomach cancer Neg Hx    Pancreatic cancer Neg Hx       Objective  Blood pressure 136/80, pulse 83, height 6\' 5"  (1.956 m), SpO2 98 %.   General: No apparent distress alert and oriented x3 mood and affect normal, dressed appropriately.  HEENT: Pupils equal, extraocular movements intact  Respiratory: Patient's speak in full sentences and does not appear short of breath  Cardiovascular: No lower extremity edema, non tender, no erythema    After informed written and verbal consent, patient was seated on exam table. Right knee was prepped with alcohol swab and utilizing anterolateral approach, patient's  right knee space was injected with15 mg/2.5 mL of Orthovisc(sodium hyaluronate) in a prefilled syringe was injected easily into the knee through a 22-gauge needle..Patient tolerated the procedure well without immediate complications.    Impression and Recommendations:    The above documentation has been reviewed and is accurate and complete , DO

## 2022-02-03 ENCOUNTER — Ambulatory Visit: Payer: BC Managed Care – PPO | Admitting: Family Medicine

## 2022-02-03 VITALS — BP 136/80 | HR 83 | Ht 77.0 in

## 2022-02-03 DIAGNOSIS — M1711 Unilateral primary osteoarthritis, right knee: Secondary | ICD-10-CM | POA: Diagnosis not present

## 2022-02-03 MED ORDER — HYALURONAN 30 MG/2ML IX SOSY
30.0000 mg | PREFILLED_SYRINGE | Freq: Once | INTRA_ARTICULAR | Status: AC
  Administered 2022-02-03: 30 mg via INTRA_ARTICULAR

## 2022-02-03 NOTE — Assessment & Plan Note (Signed)
Second viscosupplementation given today.  Patient will come back in 1 week for third in a series of 4 injections.  Continue to stay active otherwise.

## 2022-02-03 NOTE — Patient Instructions (Signed)
Good to see you Orthovisc #2 given in right knee today See you next week for #3

## 2022-02-08 NOTE — Progress Notes (Unsigned)
  [name] [address] [place] [address] Phone: [phone] Subjective:   Seth Morales, am serving as a scribe for Dr. Antoine Primas.  I'm seeing this patient by the request  of:  Pcp, No  CC: Right knee pain  [id number]  02/03/2022 Second viscosupplementation given today. Patient will come back in 1 week for third in a series of 4 injections. Continue to stay active otherwise.   Update 02/10/2022 Seth Morales is a 61 y.o. male coming in with complaint of R knee pain. Patient states that he did a lot of traveling recently and more discomfort recently. Today's Vitals   02/10/22 1558  BP: 122/78  Pulse: 84  SpO2: 97%  Weight: 272 lb (123.4 kg)  Height: 6\' 5"  (1.956 m)   Body mass index is 32.25 kg/m.  General: No apparent distress alert and oriented x3 mood and affect normal, dressed appropriately.   After informed written and verbal consent, patient was seated on exam table. Right knee was prepped with alcohol swab and utilizing anterolateral approach, patient's right knee space was injected with15 mg/2.5 mL of Orthovisc(sodium hyaluronate) in a prefilled syringe was injected easily into the knee through a 22-gauge needle..Patient tolerated the procedure well without immediate complications.    Impression and Recommendations:     The above documentation has been reviewed and is accurate and complete , DO

## 2022-02-10 ENCOUNTER — Ambulatory Visit: Payer: BC Managed Care – PPO | Admitting: Family Medicine

## 2022-02-10 VITALS — BP 122/78 | HR 84 | Ht 77.0 in | Wt 272.0 lb

## 2022-02-10 DIAGNOSIS — G8929 Other chronic pain: Secondary | ICD-10-CM | POA: Diagnosis not present

## 2022-02-10 DIAGNOSIS — M25561 Pain in right knee: Secondary | ICD-10-CM | POA: Diagnosis not present

## 2022-02-10 DIAGNOSIS — M1711 Unilateral primary osteoarthritis, right knee: Secondary | ICD-10-CM | POA: Diagnosis not present

## 2022-02-10 MED ORDER — HYALURONAN 30 MG/2ML IX SOSY
30.0000 mg | PREFILLED_SYRINGE | Freq: Once | INTRA_ARTICULAR | Status: AC
  Administered 2022-02-10: 30 mg via INTRA_ARTICULAR

## 2022-02-10 NOTE — Assessment & Plan Note (Signed)
Third in a series of 4 injections for viscosupplementation.  Has responded previously to this and hopefully patient will continue.  Follow-up with me again in 6 to 8 weeks.

## 2022-02-15 NOTE — Progress Notes (Unsigned)
[name] [address] [place] [address] Phone: [phone] Subjective:   Seth Morales, am serving as a scribe for Dr. Antoine Primas.  I'm seeing this patient by the request  of:  Pcp, No  CC: Right knee pain  [id number]  Seth Morales is a 61 y.o. male coming in with complaint of R knee pain. Patient states that he can tell the knee is getting better, but it just slowly getting better.        Past Medical History:  Diagnosis Date   Allergy    Anemia    Back pain    Bilateral swelling of feet    Blood transfusion without reported diagnosis 13 years ago    Chronic kidney disease    Dermatitis    Hypertension    Joint pain    Kidney transplanted    Vitamin D deficiency    Past Surgical History:  Procedure Laterality Date   COLONOSCOPY  09/06/2012   at [place]   EYE SURGERY     INSERTION OF DIALYSIS CATHETER     KIDNEY TRANSPLANT  2006   KNEE ARTHROPLASTY Bilateral    NEPHRECTOMY Bilateral    Social History   Socioeconomic History   Marital status: Married    Spouse name: Not on file   Number of children: Not on file   Years of education: Not on file   Highest education level: Not on file  Occupational History   Not on file  Tobacco Use   Smoking status: Never   Smokeless tobacco: Never  Vaping Use   Vaping Use: Never used  Substance and Sexual Activity   Alcohol use: Yes    Alcohol/week: 2.0 standard drinks of alcohol    Types: 2 Standard drinks or equivalent per week   Drug use: No   Sexual activity: Not on file  Other Topics Concern   Not on file  Social History Narrative   Not on file   Social Determinants of Health   Financial Resource Strain: Not on file  Food Insecurity: Not on file  Transportation Needs: Not on file  Physical Activity: Not on file  Stress: Not on file  Social Connections: Not on file   No Known Allergies Family History  Problem Relation Age of Onset    Arthritis Mother    Hypertension Mother    Kidney disease Mother    Diabetes Mother    Diabetes Father    Colon cancer Neg Hx    Colon polyps Neg Hx    Esophageal cancer Neg Hx    Rectal cancer Neg Hx    Stomach cancer Neg Hx    Pancreatic cancer Neg Hx     Current Outpatient Medications (Endocrine & Metabolic):    predniSONE (DELTASONE) 5 MG tablet, Take 1 tablet (5 mg total) by mouth daily with breakfast.  Current Outpatient Medications (Cardiovascular):    hydrochlorothiazide (MICROZIDE) 12.5 MG capsule, Take 12.5 mg by mouth every other day.   Current Outpatient Medications (Analgesics):    allopurinol (ZYLOPRIM) 100 MG tablet, Take 100 mg by mouth daily.   Current Outpatient Medications (Other):    Cholecalciferol (VITAMIN D) 125 MCG (5000 UT) CAPS, Take 5,000 Units by mouth daily.    cycloSPORINE modified (NEORAL) 100 MG capsule, Take 100 mg by mouth 2 (two) times daily.   cycloSPORINE modified (NEORAL) 25 MG capsule,    doxycycline (MONODOX) 100 MG capsule, Take 100 mg by mouth daily  at 2 PM.   ketoconazole (NIZORAL) 2 % cream, Apply 1 application. topically 2 (two) times daily.   magnesium oxide-pyridoxine (BEELITH) 362-20 MG TABS, Take 1 tablet by mouth daily.   Multiple Vitamin (MULTI-VITAMIN) tablet, Take by mouth.   mycophenolate (CELLCEPT) 250 MG capsule, Take 750 mg by mouth 2 (two) times daily.   Omega-3 Fatty Acids (FISH OIL PO), Take by mouth.   selenium sulfide (SELSUN) 2.5 % shampoo, Apply topically as directed.   Sulfacetamide Sodium-Sulfur 8-4 % SUSP,     Objective  Blood pressure 134/84, pulse 73, height 6\' 5"  (1.956 m), SpO2 97 %.   General: No apparent distress alert and oriented x3 mood and affect normal, dressed appropriately.   After informed written and verbal consent, patient was seated on exam table. Right knee was prepped with alcohol swab and utilizing anterolateral approach, patient's right knee space was injected with15 mg/2.5 mL of  Orthovisc(sodium hyaluronate) in a prefilled syringe was injected easily into the knee through a 22-gauge needle..Patient tolerated the procedure well without immediate complications.   Impression and Recommendations:     The above documentation has been reviewed and is accurate and complete , DO

## 2022-02-17 ENCOUNTER — Ambulatory Visit: Payer: BC Managed Care – PPO | Admitting: Family Medicine

## 2022-02-17 VITALS — BP 134/84 | HR 73 | Ht 77.0 in

## 2022-02-17 DIAGNOSIS — M1711 Unilateral primary osteoarthritis, right knee: Secondary | ICD-10-CM | POA: Diagnosis not present

## 2022-02-17 MED ORDER — HYALURONAN 30 MG/2ML IX SOSY
30.0000 mg | PREFILLED_SYRINGE | Freq: Once | INTRA_ARTICULAR | Status: AC
  Administered 2022-02-17: 30 mg via INTRA_ARTICULAR

## 2022-02-17 NOTE — Assessment & Plan Note (Signed)
Viscosupplementation today.  Recommended in 6 to 8 weeks otherwise

## 2022-02-17 NOTE — Patient Instructions (Addendum)
Good to see you  Last Orthovisc injection today Follow up 6-8 weeks or as needed

## 2022-03-09 ENCOUNTER — Encounter (INDEPENDENT_AMBULATORY_CARE_PROVIDER_SITE_OTHER): Payer: Self-pay | Admitting: Adult Health

## 2022-03-09 ENCOUNTER — Ambulatory Visit (INDEPENDENT_AMBULATORY_CARE_PROVIDER_SITE_OTHER): Payer: BC Managed Care – PPO | Admitting: Adult Health

## 2022-03-09 VITALS — BP 156/79 | HR 75 | Temp 98.0°F | Ht 77.0 in | Wt 268.0 lb

## 2022-03-09 DIAGNOSIS — Z6831 Body mass index (BMI) 31.0-31.9, adult: Secondary | ICD-10-CM

## 2022-03-09 DIAGNOSIS — I10 Essential (primary) hypertension: Secondary | ICD-10-CM

## 2022-03-09 DIAGNOSIS — E669 Obesity, unspecified: Secondary | ICD-10-CM

## 2022-03-09 MED ORDER — SAXENDA 18 MG/3ML ~~LOC~~ SOPN: PEN_INJECTOR | SUBCUTANEOUS | 0 refills | Status: AC

## 2022-03-14 NOTE — Progress Notes (Signed)
Chief Complaint:   OBESITY Seth Morales is here to discuss his progress with his obesity treatment plan along with follow-up of his obesity related diagnoses. [name] is on the Category 3 Plan and states he is following his eating plan approximately 0% of the time. [name] states he is not exercising.  Today's visit was #: 32 Starting weight: 285 LBS Starting date: 05/22/2019 Today's weight: 268 LBS Today's date: 03/09/2022 Total lbs lost to date: 17 LBS Total lbs lost since last in-office visit: +10 LBS  Interim History:  Seth Morales recently experienced his first COVID-19 infection. 02/23/22 + COVID test date. He experienced nasal drainage, non-productive cough, and fever for approximately 36 hours. He was treated with 5 day course of Molnupiravir therapy.  Of Note- Since he has been off Mounjaro therapy, he endorses a "ravenous appetite". He experienced hypoglycemia with Mounjaro- d/c'd  08/20/2021. He denies family hx of MENS 2 or MTC. He denies personal hx of pancreatitis.  Subjective:   1. Essential hypertension Home blood pressure readings systolic blood pressure 120, diastolic blood pressure 70s.   Blood pressure today elevated at office visit-156/79.  Patient had a recent COVID-19 infection.   He endorses increased work stress.   He denies acute cardiac symptoms.   Nephrology manages his hypertension, he is taking HCTZ 12.5 mg every other day.  Assessment/Plan:   1. Essential hypertension We will monitor closely.  Decrease sodium intake.   Follow-up with nephrology as directed.  2. Obesity: Current BMI 31.8 Start- Liraglutide -Weight Management (SAXENDA) 18 MG/3ML SOPN; Inject 0.6 mg into the skin daily for 14 days, THEN 1.2 mg daily for 14 days.  Dispense: 4 mL; Refill: 0  MONITOR FOR SX'S OF HYPOGLYCEMIA- if any develop- stop GLP-1 and contact clinic.  Pt. Verbalized understanding and agreement.  Seth Morales is currently in the action stage of change. As such, his  goal is to continue with weight loss efforts. He has agreed to the Category 3 Plan.   Exercise goals:  Increase activity as tolerated.  Behavioral modification strategies: increasing lean protein intake, decreasing simple carbohydrates, increasing vegetables, increasing water intake, meal planning and cooking strategies, and planning for success.  Seth Morales has agreed to follow-up with our clinic in 4 weeks. He was informed of the importance of frequent follow-up visits to maximize his success with intensive lifestyle modifications for his multiple health conditions.   Objective:   Blood pressure (!) 156/79, pulse 75, temperature 98 F (36.7 C), height 6\' 5"  (1.956 m), weight 268 lb (121.6 kg), SpO2 99 %. Body mass index is 31.78 kg/m.  General: Cooperative, alert, well developed, in no acute distress. HEENT: Conjunctivae and lids unremarkable. Cardiovascular: Regular rhythm.  Lungs: Normal work of breathing. Neurologic: No focal deficits.   Lab Results  Component Value Date   CREATININE 1.12 09/29/2021   BUN 23 09/29/2021   NA 143 09/29/2021   K 4.1 09/29/2021   CL 102 09/29/2021   CO2 25 09/29/2021   Lab Results  Component Value Date   ALT 33 09/29/2021   AST 31 09/29/2021   ALKPHOS 68 09/29/2021   BILITOT 0.9 09/29/2021   Lab Results  Component Value Date   HGBA1C 5.4 09/29/2021   HGBA1C 5.3 05/04/2021   HGBA1C 5.5 06/25/2020   HGBA1C 5.4 05/22/2019   HGBA1C 5.4 11/30/2018   Lab Results  Component Value Date   INSULIN 7.1 09/29/2021   INSULIN 12.8 05/04/2021   INSULIN 8.5 06/25/2020   INSULIN 6.0 01/20/2020  INSULIN 14.6 05/22/2019   Lab Results  Component Value Date   TSH 2.970 05/22/2019   Lab Results  Component Value Date   CHOL 180 06/25/2020   HDL 51 06/25/2020   LDLCALC 109 (H) 06/25/2020   LDLDIRECT 114.0 03/14/2019   TRIG 113 06/25/2020   CHOLHDL 3.4 01/20/2020   Lab Results  Component Value Date   VD25OH 67.7 09/29/2021   VD25OH 74.0  05/04/2021   VD25OH 61.9 06/25/2020   Lab Results  Component Value Date   WBC 6.4 01/20/2020   HGB 14.8 01/20/2020   HCT 45.5 01/20/2020   MCV 93 01/20/2020   PLT 177 01/20/2020   No results found for: "IRON", "TIBC", "FERRITIN"  Attestation Statements:   Reviewed by clinician on day of visit: allergies, medications, problem list, medical history, surgical history, family history, social history, and previous encounter notes.  I, Davy Pique, RMA, am acting as Location manager for Mina Marble, NP.  I have reviewed the above documentation for accuracy and completeness, and I agree with the above. -  Tyffani Foglesong d. Akili Corsetti, NP-C

## 2022-04-06 ENCOUNTER — Ambulatory Visit: Payer: BC Managed Care – PPO | Admitting: Family Medicine

## 2022-04-07 ENCOUNTER — Ambulatory Visit (INDEPENDENT_AMBULATORY_CARE_PROVIDER_SITE_OTHER): Payer: BC Managed Care – PPO | Admitting: Adult Health

## 2022-04-07 ENCOUNTER — Encounter (INDEPENDENT_AMBULATORY_CARE_PROVIDER_SITE_OTHER): Payer: Self-pay | Admitting: Adult Health

## 2022-04-07 VITALS — BP 159/86 | HR 58 | Temp 97.7°F | Ht 77.0 in | Wt 269.0 lb

## 2022-04-07 DIAGNOSIS — I10 Essential (primary) hypertension: Secondary | ICD-10-CM

## 2022-04-07 DIAGNOSIS — E669 Obesity, unspecified: Secondary | ICD-10-CM | POA: Diagnosis not present

## 2022-04-07 DIAGNOSIS — Z6832 Body mass index (BMI) 32.0-32.9, adult: Secondary | ICD-10-CM | POA: Diagnosis not present

## 2022-04-18 NOTE — Progress Notes (Signed)
[facility] Phone: [phone] Subjective:   [name], am serving as a scribe for Dr. Hulan Saas. I'm seeing this patient by the request  of:  Pcp, No  CC: Right knee pain  [id number]  02/17/2022 Viscosupplementation today. Recommended in 6 to 8 weeks otherwise   Updated 04/19/2022 RISHIKESH GORKA is a 62 y.o. male coming in with complaint of R knee pain. Patient was walking the dog and the dog went after a squirrel and his knee pain increased over anterior aspect. Pain in knee and hips increase after sitting. Walking decreases the soreness. Pain in hips is over piriformis.       Past Medical History:  Diagnosis Date   Allergy    Anemia    Back pain    Bilateral swelling of feet    Blood transfusion without reported diagnosis 13 years ago    Chronic kidney disease    Dermatitis    Hypertension    Joint pain    Kidney transplanted    Vitamin D deficiency    Past Surgical History:  Procedure Laterality Date   COLONOSCOPY  09/06/2012   at [facility]     KIDNEY TRANSPLANT  2006   KNEE ARTHROPLASTY Bilateral    NEPHRECTOMY Bilateral    Social History   Socioeconomic History   Marital status: Married    Spouse name: Not on file   Number of children: Not on file   Years of education: Not on file   Highest education level: Not on file  Occupational History   Not on file  Tobacco Use   Smoking status: Never   Smokeless tobacco: Never  Vaping Use   Vaping Use: Never used  Substance and Sexual Activity   Alcohol use: Yes    Alcohol/week: 2.0 standard drinks of alcohol    Types: 2 Standard drinks or equivalent per week   Drug use: No   Sexual activity: Not on file  Other Topics Concern   Not on file  Social History Narrative   Not on file   Social Determinants of Health   Financial Resource Strain: Not on file  Food  Insecurity: Not on file  Transportation Needs: Not on file  Physical Activity: Not on file  Stress: Not on file  Social Connections: Not on file   No Known Allergies Family History  Problem Relation Age of Onset   Arthritis Mother    Hypertension Mother    Kidney disease Mother    Diabetes Mother    Diabetes Father    Colon cancer Neg Hx    Colon polyps Neg Hx    Esophageal cancer Neg Hx    Rectal cancer Neg Hx    Stomach cancer Neg Hx    Pancreatic cancer Neg Hx     Current Outpatient Medications (Endocrine & Metabolic):    predniSONE (DELTASONE) 5 MG tablet, Take 1 tablet (5 mg total) by mouth daily with breakfast.  Current Outpatient Medications (Cardiovascular):    hydrochlorothiazide (MICROZIDE) 12.5 MG capsule, Take 12.5 mg by mouth every other day.   Current Outpatient Medications (Analgesics):    allopurinol (ZYLOPRIM) 100 MG tablet, Take 100 mg by mouth daily.   Current Outpatient Medications (Other):    Cholecalciferol (VITAMIN D) 125 MCG (5000 UT) CAPS, Take 5,000 Units by mouth daily.    cycloSPORINE modified (NEORAL) 100  MG capsule, Take 100 mg by mouth 2 (two) times daily.   cycloSPORINE modified (NEORAL) 25 MG capsule,    doxycycline (MONODOX) 100 MG capsule, Take 100 mg by mouth daily at 2 PM.   magnesium oxide-pyridoxine (BEELITH) 362-20 MG TABS, Take 1 tablet by mouth daily.   Multiple Vitamin (MULTI-VITAMIN) tablet, Take by mouth.   mycophenolate (CELLCEPT) 250 MG capsule, Take 750 mg by mouth 2 (two) times daily.   Omega-3 Fatty Acids (FISH OIL PO), Take by mouth.   selenium sulfide (SELSUN) 2.5 % shampoo, Apply topically as directed.   Sulfacetamide Sodium-Sulfur 8-4 % SUSP,    Reviewed prior external information including notes and imaging from  primary care provider As well as notes that were available from care everywhere and other healthcare systems.  Past medical history, social, surgical and family history all reviewed in electronic medical  record.  No pertanent information unless stated regarding to the chief complaint.   Review of Systems:  No headache, visual changes, nausea, vomiting, diarrhea, constipation, dizziness, abdominal pain, skin rash, fevers, chills, night sweats, weight loss, swollen lymph nodes, body aches, joint swelling, chest pain, shortness of breath, mood changes. POSITIVE muscle aches  Objective  Blood pressure (!) 144/84, pulse 76, height 6' 5"$  (1.956 m), weight 279 lb (126.6 kg), SpO2 97 %.   General: No apparent distress alert and oriented x3 mood and affect normal, dressed appropriately.  HEENT: Pupils equal, extraocular movements intact  Respiratory: Patient's speak in full sentences and does not appear short of breath  Cardiovascular: No lower extremity edema, non tender, no erythema  Right knee exam shows the patient does have unfortunately instability with the knee noted.  Some mild crepitus noted.  Does have a trace effusion noted.  Instability with valgus and varus force.  After informed written and verbal consent, patient was seated on exam table. Right knee was prepped with alcohol swab and utilizing anterolateral approach, patient's right knee space was injected with 4:1  marcaine 0.5%: Kenalog 59m/dL. Patient tolerated the procedure well without immediate complications.    Impression and Recommendations:     The above documentation has been reviewed and is accurate and complete ZLyndal Pulley DO

## 2022-04-19 ENCOUNTER — Encounter: Payer: Self-pay | Admitting: Family Medicine

## 2022-04-19 ENCOUNTER — Ambulatory Visit: Payer: BC Managed Care – PPO | Admitting: Family Medicine

## 2022-04-19 VITALS — BP 144/84 | HR 76 | Ht 77.0 in | Wt 279.0 lb

## 2022-04-19 DIAGNOSIS — M1711 Unilateral primary osteoarthritis, right knee: Secondary | ICD-10-CM | POA: Diagnosis not present

## 2022-04-19 NOTE — Assessment & Plan Note (Signed)
Injection given today, worsening pain, patient not had affecting daily activities, discussed icing regimen and home exercises.  Follow-up again in 6 to 8 weeks

## 2022-04-19 NOTE — Patient Instructions (Signed)
Injection in R knee

## 2022-04-20 NOTE — Progress Notes (Signed)
Chief Complaint:   OBESITY Elija is here to discuss his progress with his obesity treatment plan along with follow-up of his obesity related diagnoses. Johndaniel is on the Category 3 Plan and states he is following his eating plan approximately 60-70% of the time. Khaliq states he is walking 20 minutes 5 times per week.  Today's visit was #: 52 Starting weight: 101 LBS Starting date: 05/22/2019 Today's weight: 269 LBS Today's date: 04/07/2022 Total lbs lost to date: 16 LBS Total lbs lost since last in-office visit: +1 LB  Interim History:  Mr. Kapple sustained a Right knee injury while walking his dog. He reports "pulled right knee".  He was unable to obtain Saxenda due to [employer]. He has been off all GLP-1 or GIP/GLP-1 therapy for months.  He endorses increased polyphagia. He has been trying to reduce overall meal portion size and limit alcohol to weekends only.  Subjective:   1. Essential hypertension Nephrology manages HCTZ 12.5 daily. BP slightly elevated at OV- he denies acute cardiac sx's at present. He denies tobacco/vape use. Last 2 CMPs in system- stable electrolytes and Kidney fx.  Assessment/Plan:   1. Essential hypertension BP slightly elevated at OV. He denies acute cardiac sx's at present. Nephrology manages daily HCTZ 12.41m QD- hx of Kidney transplant.  2. Obesity, current BMI 32.0 RSamnangis currently in the action stage of change. As such, his goal is to continue with weight loss efforts. He has agreed to the Category 3 Plan.   Exercise goals:  As is.  Behavioral modification strategies: increasing lean protein intake, decreasing simple carbohydrates, meal planning and cooking strategies, keeping healthy foods in the home, and planning for success.  RThristanhas agreed to follow-up with our clinic in 4 weeks. He was informed of the importance of frequent follow-up visits to maximize his success with intensive lifestyle modifications  for his multiple health conditions.   Objective:   Blood pressure (!) 159/86, pulse (!) 58, temperature 97.7 F (36.5 C), height 6' 5"$  (1.956 m), weight 269 lb (122 kg), SpO2 99 %. Body mass index is 31.9 kg/m.  General: Cooperative, alert, well developed, in no acute distress. HEENT: Conjunctivae and lids unremarkable. Cardiovascular: Regular rhythm.  Lungs: Normal work of breathing. Neurologic: No focal deficits.   Lab Results  Component Value Date   CREATININE 1.12 09/29/2021   BUN 23 09/29/2021   NA 143 09/29/2021   K 4.1 09/29/2021   CL 102 09/29/2021   CO2 25 09/29/2021   Lab Results  Component Value Date   ALT 33 09/29/2021   AST 31 09/29/2021   ALKPHOS 68 09/29/2021   BILITOT 0.9 09/29/2021   Lab Results  Component Value Date   HGBA1C 5.4 09/29/2021   HGBA1C 5.3 05/04/2021   HGBA1C 5.5 06/25/2020   HGBA1C 5.4 05/22/2019   HGBA1C 5.4 11/30/2018   Lab Results  Component Value Date   INSULIN 7.1 09/29/2021   INSULIN 12.8 05/04/2021   INSULIN 8.5 06/25/2020   INSULIN 6.0 01/20/2020   INSULIN 14.6 05/22/2019   Lab Results  Component Value Date   TSH 2.970 05/22/2019   Lab Results  Component Value Date   CHOL 180 06/25/2020   HDL 51 06/25/2020   LDLCALC 109 (H) 06/25/2020   LDLDIRECT 114.0 03/14/2019   TRIG 113 06/25/2020   CHOLHDL 3.4 01/20/2020   Lab Results  Component Value Date   VD25OH 67.7 09/29/2021   VD25OH 74.0 05/04/2021   VD25OH 61.9 06/25/2020  Lab Results  Component Value Date   WBC 6.4 01/20/2020   HGB 14.8 01/20/2020   HCT 45.5 01/20/2020   MCV 93 01/20/2020   PLT 177 01/20/2020   No results found for: "IRON", "TIBC", "FERRITIN"  Attestation Statements:   Reviewed by clinician on day of visit: allergies, medications, problem list, medical history, surgical history, family history, social history, and previous encounter notes.  I, Davy Pique, RMA, am acting as Location manager for Mina Marble, NP.  I have reviewed  the above documentation for accuracy and completeness, and I agree with the above. -  Gehrig Patras d. Meredith Mells, NP-C

## 2022-04-21 ENCOUNTER — Encounter (INDEPENDENT_AMBULATORY_CARE_PROVIDER_SITE_OTHER): Payer: Self-pay | Admitting: Adult Health

## 2022-04-28 IMAGING — DX DG KNEE 3 VIEWS*L*
3 series · 3 of 3 positions shown · non-contrast
Comparison: None.

CLINICAL DATA: Chronic left knee pain

EXAM:
LEFT KNEE - 3 VIEW

[knee ap]
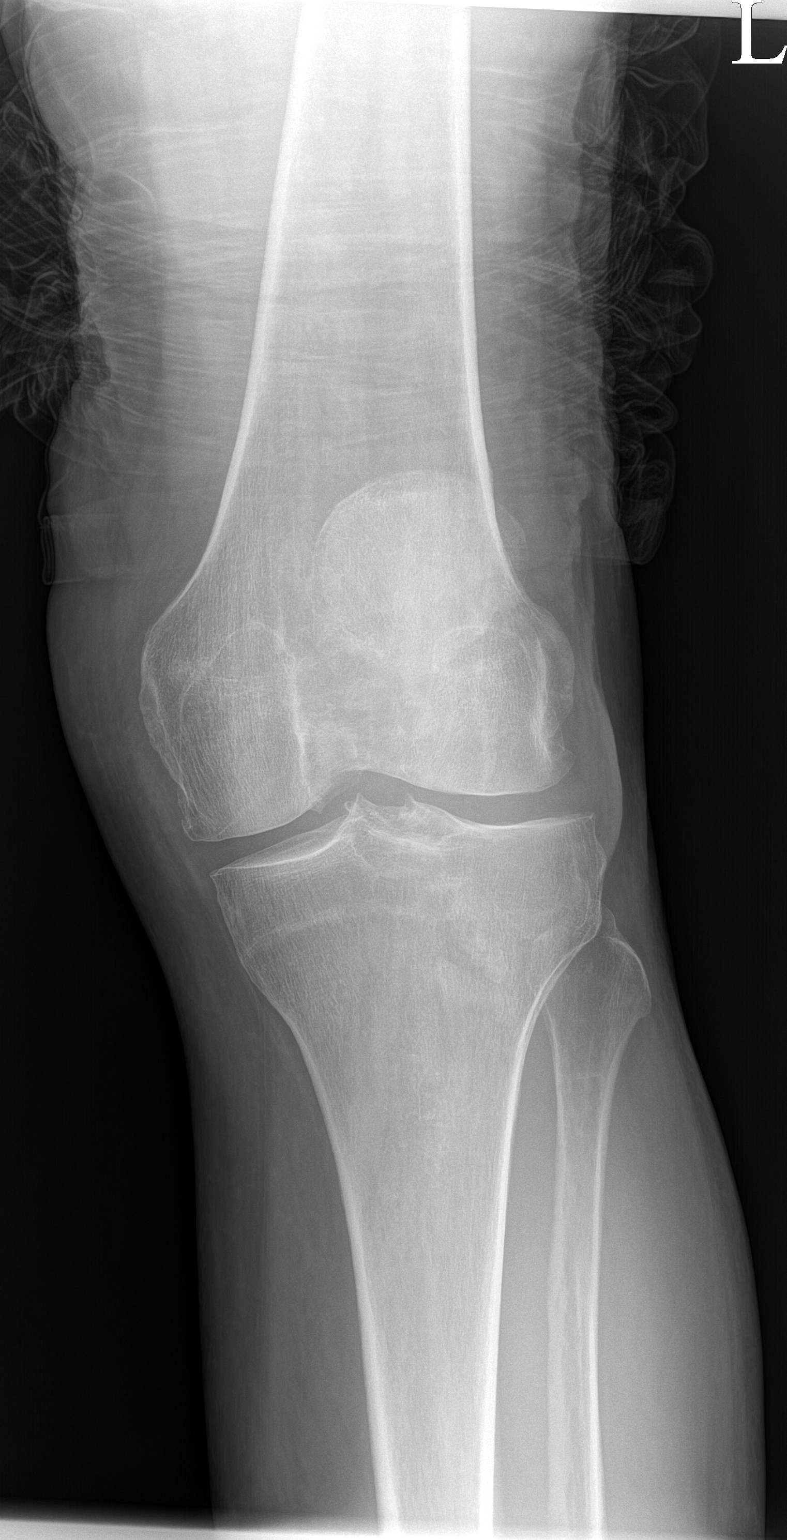

[knee lat]
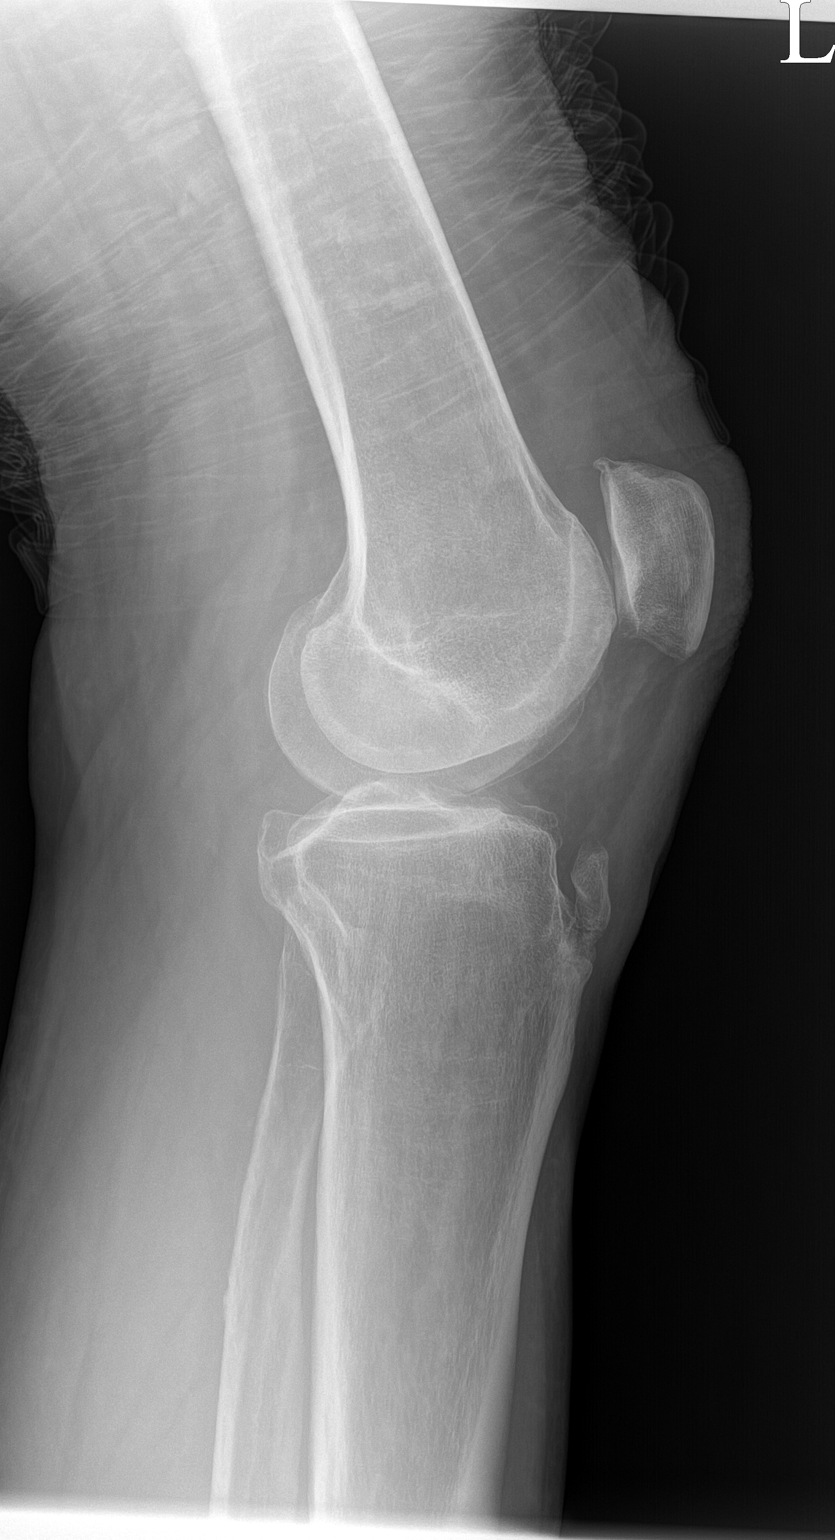

[patella]
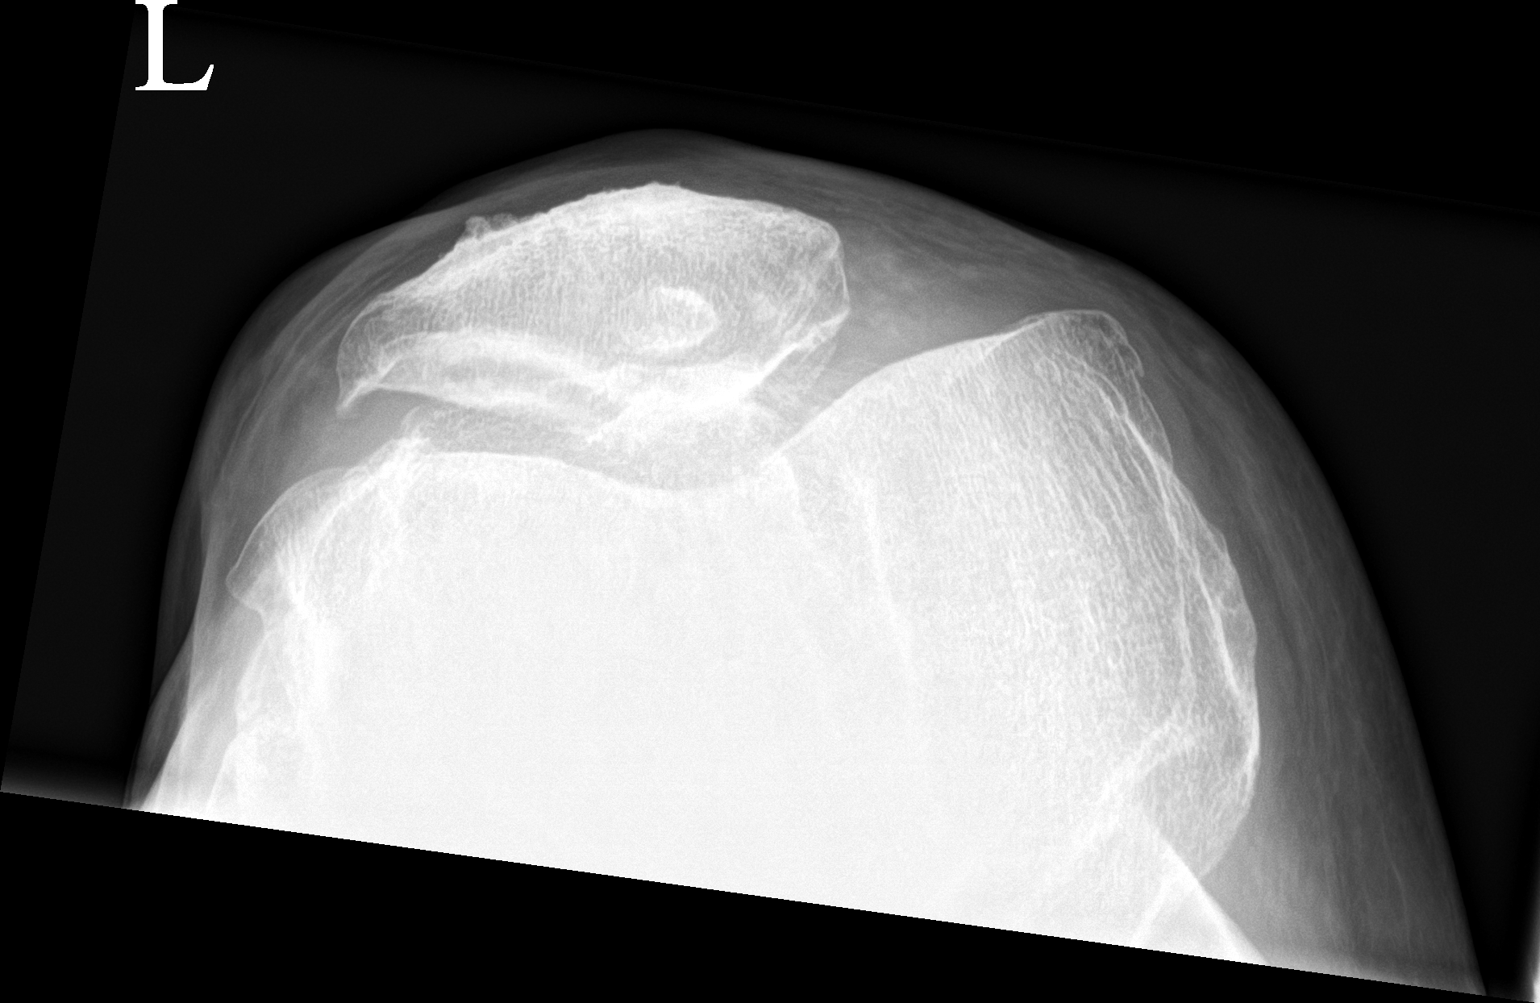

[3 of 3 positions shown; findings below may reference images not displayed]

FINDINGS: Small knee joint effusion. Normal weight-bearing compartment height.
Evidence of a degree of chondromalacia the patellofemoral joint.
Chronic patellar tendon attachment calcification, not likely of
acute relevance.
IMPRESSION: Small knee joint effusion. Degenerative change of the patellofemoral
joint.

## 2022-05-04 ENCOUNTER — Encounter (INDEPENDENT_AMBULATORY_CARE_PROVIDER_SITE_OTHER): Payer: Self-pay | Admitting: Adult Health

## 2022-05-04 ENCOUNTER — Ambulatory Visit (INDEPENDENT_AMBULATORY_CARE_PROVIDER_SITE_OTHER): Payer: BC Managed Care – PPO | Admitting: Adult Health

## 2022-05-04 VITALS — BP 152/84 | HR 59 | Temp 98.0°F | Ht 77.0 in | Wt 267.0 lb

## 2022-05-04 DIAGNOSIS — E88819 Insulin resistance, unspecified: Secondary | ICD-10-CM | POA: Diagnosis not present

## 2022-05-04 DIAGNOSIS — I10 Essential (primary) hypertension: Secondary | ICD-10-CM | POA: Diagnosis not present

## 2022-05-04 DIAGNOSIS — E559 Vitamin D deficiency, unspecified: Secondary | ICD-10-CM

## 2022-05-04 DIAGNOSIS — E669 Obesity, unspecified: Secondary | ICD-10-CM

## 2022-05-04 DIAGNOSIS — Q613 Polycystic kidney, unspecified: Secondary | ICD-10-CM

## 2022-05-04 DIAGNOSIS — Z6831 Body mass index (BMI) 31.0-31.9, adult: Secondary | ICD-10-CM

## 2022-05-04 NOTE — Assessment & Plan Note (Signed)
BP above goal. Nephrology manages daily HCTZ 12.'5mg'$  He feels that his lack of regular exercise is contributing to increase in BP.

## 2022-05-04 NOTE — Assessment & Plan Note (Signed)
He is on OTC Vit D 3 5,000 IU QD and daily OTC Multivitamin

## 2022-05-04 NOTE — Assessment & Plan Note (Signed)
Deceased-donor kidney transplant recipient  He is taking cyclosporine and Cellcept.   He is at a higher risk of COVID complications.   He has been vaccinated and boosted with a positive COVID spike protein on 11/13/2019.   Followed by Nephrology

## 2022-05-04 NOTE — Progress Notes (Signed)
Chief Complaint:   OBESITY Seth Morales is here to discuss his progress with his obesity treatment plan along with follow-up of his obesity related diagnoses. Seth Morales is on the Category 3 Plan and states he is following his eating plan approximately 60% of the time.  Seth Morales states he is not currently exercising.  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  Pharmacotherapy:  Since he has been off Mounjaro therapy, he endorses a "ravenous appetite". He experienced hypoglycemia with Mounjaro- d/c'd  08/20/2021.  He denies family hx of MENS 2 or MTC. He denies personal hx of pancreatitis.  Provided Saxenda Rx provided Jan 2024- has not started due to [employer]. Also concerned that his insurance will not cover when it comes back in stock. Subjective:   1. Essential hypertension BP above goal. He denies acute cardiac sx's. Nephrology manages daily HCTZ 12.'5mg'$  He feels that his lack of exercise has contributed to BP trending up.  2. Insulin resistance His insurance will not cover any GLP-1 or GIP/GLP-1 therapy for Obesity tx.  3. Polycystic kidney disease  Hx of polycystic kidney disease s/p transplant.   Seth Morales is status post transplant and is on immunosuppressants. He had the Moderna vaccine x2   4. Vitamin D deficiency  Latest Reference Range & Units 09/29/21 09:52  Vitamin D, 25-Hydroxy 30.0 - 100.0 ng/mL 67.7  He is on daily OTC Multivitiamin and OTC Vit D3 5,000 IU   Assessment/Plan:   1. Essential hypertension Reduce Na+ Increase exercise F/U WITH NEPHROLOGY Check labs at next OV.  2. Insulin resistance Continue to reduce sugar/CHO Check labs at next OV.  3. Polycystic  kidney disease Continue current Rx Regime and f/u with Nephrology   4. Vitamin D deficiency Continue current supplements and check labs at next OV.  5. Obesity, current BMI 31.8  Seth Morales is currently in the action stage of change. As such, his goal is to continue with weight loss efforts. He has agreed to the Category 3 Plan.   Exercise goals: For substantial health benefits, adults should do at least 150 minutes (2 hours and 30 minutes) a week of moderate-intensity, or 75 minutes (1 hour and 15 minutes) a week of vigorous-intensity aerobic physical activity, or an equivalent combination of moderate- and vigorous-intensity aerobic activity. Aerobic activity should be performed in episodes of at least 10 minutes, and preferably, it should be spread throughout the week.  Behavioral modification strategies: increasing lean protein intake, decreasing simple carbohydrates, increasing vegetables, increasing water intake, decreasing eating out, no skipping meals, meal planning and cooking strategies, travel eating strategies, and planning for success.  Seth Morales has agreed to follow-up with our clinic in 4 weeks. He was informed of the importance of frequent follow-up visits to maximize his success with intensive lifestyle modifications for his multiple health conditions.   Check Fasting Labs at Next OV  Objective:   Blood pressure (!) 152/84, pulse (!) 59, temperature 98 F (36.7 C), height '6\' 5"'$  (1.956 m), weight 267 lb (121.1 kg), SpO2 99 %. Body mass index is 31.66 kg/m.  General: Cooperative, alert, well developed, in no acute distress. HEENT: Conjunctivae and lids unremarkable. Cardiovascular: Regular rhythm.  Lungs: Normal work of  breathing. Neurologic: No focal deficits.   Lab Results  Component Value Date   CREATININE 1.12 09/29/2021   BUN 23 09/29/2021   NA 143 09/29/2021   K 4.1 09/29/2021   CL 102 09/29/2021   CO2 25 09/29/2021   Lab Results  Component Value Date   ALT 33  09/29/2021   AST 31 09/29/2021   ALKPHOS 68 09/29/2021   BILITOT 0.9 09/29/2021   Lab Results  Component Value Date   HGBA1C 5.4 09/29/2021   HGBA1C 5.3 05/04/2021   HGBA1C 5.5 06/25/2020   HGBA1C 5.4 05/22/2019   HGBA1C 5.4 11/30/2018   Lab Results  Component Value Date   INSULIN 7.1 09/29/2021   INSULIN 12.8 05/04/2021   INSULIN 8.5 06/25/2020   INSULIN 6.0 01/20/2020   INSULIN 14.6 05/22/2019   Lab Results  Component Value Date   TSH 2.970 05/22/2019   Lab Results  Component Value Date   CHOL 180 06/25/2020   HDL 51 06/25/2020   LDLCALC 109 (H) 06/25/2020   LDLDIRECT 114.0 03/14/2019   TRIG 113 06/25/2020   CHOLHDL 3.4 01/20/2020   Lab Results  Component Value Date   VD25OH 67.7 09/29/2021   VD25OH 74.0 05/04/2021   VD25OH 61.9 06/25/2020   Lab Results  Component Value Date   WBC 6.4 01/20/2020   HGB 14.8 01/20/2020   HCT 45.5 01/20/2020   MCV 93 01/20/2020   PLT 177 01/20/2020   No results found for: "IRON", "TIBC", "FERRITIN"  Attestation Statements:   Reviewed by clinician on day of visit: allergies, medications, problem list, medical history, surgical history, family history, social history, and previous encounter notes.  Time spent on visit including pre-visit chart review and post-visit care and charting was 32 minutes.   I have reviewed the above documentation for accuracy and completeness, and I agree with the above. -  Emanuelle Bastos d. Chrisopher Pustejovsky, NP-C

## 2022-05-04 NOTE — Assessment & Plan Note (Signed)
Latest Reference Range & Units 09/29/21 09:52  INSULIN 2.6 - 24.9 uIU/mL 7.1   He is not currently on any medication to lower blood glucose or Insulin levels.

## 2022-05-05 ENCOUNTER — Ambulatory Visit (INDEPENDENT_AMBULATORY_CARE_PROVIDER_SITE_OTHER): Payer: BC Managed Care – PPO | Admitting: Adult Health

## 2022-05-31 NOTE — Progress Notes (Signed)
[place] Phone: [phone] Subjective:   Seth Morales, am serving as a scribe for Dr. Hulan Saas.  I'm seeing this patient by the request  of:  Pcp, Morales  CC: Right hip and right knee pain  [id number]  04/19/2022 Injection given today, worsening pain, patient not had affecting daily activities, discussed icing regimen and home exercises. Follow-up again in 6 to 8 weeks   updated 06/08/2022 Seth Morales is a 62 y.o. male coming in with complaint of R hip and R knee pain. Patient is having R glute/piriformis pain for past 3 days that radiates down the R leg. Doing a lot of riding in the car. Pain in R knee has increased since last visit as well. Red and swollen today. Going to the gym and using elliptical.      Past Medical History:  Diagnosis Date   Allergy    Anemia    Back pain    Bilateral swelling of feet    Blood transfusion without reported diagnosis 13 years ago    Chronic kidney disease    Dermatitis    Hypertension    Joint pain    Kidney transplanted    Vitamin D deficiency    Past Surgical History:  Procedure Laterality Date   COLONOSCOPY  09/06/2012   at [facility]     KIDNEY TRANSPLANT  2006   KNEE ARTHROPLASTY Bilateral    NEPHRECTOMY Bilateral    Social History   Socioeconomic History   Marital status: Married    Spouse name: Not on file   Number of children: Not on file   Years of education: Not on file   Highest education level: Not on file  Occupational History   Not on file  Tobacco Use   Smoking status: Never   Smokeless tobacco: Never  Vaping Use   Vaping Use: Never used  Substance and Sexual Activity   Alcohol use: Yes    Alcohol/week: 2.0 standard drinks of alcohol    Types: 2 Standard drinks or equivalent per week   Drug use: Morales   Sexual activity: Not on file  Other Topics Concern   Not on file   Social History Narrative   Not on file   Social Determinants of Health   Financial Resource Strain: Not on file  Food Insecurity: Not on file  Transportation Needs: Not on file  Physical Activity: Not on file  Stress: Not on file  Social Connections: Not on file   Morales Known Allergies Family History  Problem Relation Age of Onset   Arthritis Mother    Hypertension Mother    Kidney disease Mother    Diabetes Mother    Diabetes Father    Colon cancer Neg Hx    Colon polyps Neg Hx    Esophageal cancer Neg Hx    Rectal cancer Neg Hx    Stomach cancer Neg Hx    Pancreatic cancer Neg Hx     Current Outpatient Medications (Endocrine & Metabolic):    predniSONE (DELTASONE) 5 MG tablet, Take 1 tablet (5 mg total) by mouth daily with breakfast.  Current Outpatient Medications (Cardiovascular):    hydrochlorothiazide (MICROZIDE) 12.5 MG capsule, Take 12.5 mg by mouth every other day.   Current Outpatient Medications (Analgesics):    allopurinol (ZYLOPRIM) 100 MG tablet, Take 100 mg by mouth daily.  Current Outpatient Medications (Other):    Cholecalciferol (VITAMIN D) 125 MCG (5000 UT) CAPS, Take 5,000 Units by mouth daily.    cycloSPORINE modified (NEORAL) 100 MG capsule, Take 100 mg by mouth 2 (two) times daily.   cycloSPORINE modified (NEORAL) 25 MG capsule,    doxycycline (MONODOX) 100 MG capsule, Take 100 mg by mouth daily at 2 PM.   magnesium oxide-pyridoxine (BEELITH) 362-20 MG TABS, Take 1 tablet by mouth daily.   Multiple Vitamin (MULTI-VITAMIN) tablet, Take by mouth.   mycophenolate (CELLCEPT) 250 MG capsule, Take 750 mg by mouth 2 (two) times daily.   Omega-3 Fatty Acids (FISH OIL PO), Take by mouth.   selenium sulfide (SELSUN) 2.5 % shampoo, Apply topically as directed.   Sulfacetamide Sodium-Sulfur 8-4 % SUSP,    Reviewed prior external information including notes and imaging from  primary care provider As well as notes that were available from care everywhere  and other healthcare systems.  Past medical history, social, surgical and family history all reviewed in electronic medical record.  Morales pertanent information unless stated regarding to the chief complaint.   Review of Systems:  Morales headache, visual changes, nausea, vomiting, diarrhea, constipation, dizziness, abdominal pain, skin rash, fevers, chills, night sweats, weight loss, swollen lymph nodes, body aches, joint swelling, chest pain, shortness of breath, mood changes. POSITIVE muscle aches  Objective  Blood pressure (!) 132/92, pulse 69, height 6\' 5"  (1.956 m), weight 267 lb (121.1 kg), SpO2 98 %.   General: Morales apparent distress alert and oriented x3 mood and affect normal, dressed appropriately.  HEENT: Pupils equal, extraocular movements intact  Respiratory: Patient's speak in full sentences and does not appear short of breath  Cardiovascular: Morales lower extremity edema, non tender, Morales erythema  Right hip does have more pain over the piriformis muscle than the greater trochanteric area.  Positive FABER test noted right greater than left.  Negative straight leg test.  Some mild tenderness to palpation in the paraspinal musculature of the lumbar spine.  Morales midline tenderness noted.  Right knee arthritic changes noted.  crepitus noted.  Instability with valgus and varus force  After informed written and verbal consent, patient was seated on exam table. Right knee was prepped with alcohol swab and utilizing anterolateral approach, patient's right knee space was injected with 4:1  marcaine 0.5%: Kenalog 40mg /dL. Patient tolerated the procedure well without immediate complications.  [id number]; 15 additional minutes spent for Therapeutic exercises as stated in above notes.  This included exercises focusing on stretching, strengthening, with significant focus on eccentric aspects.   Long term goals include an improvement in range of motion, strength, endurance as well as avoiding reinjury. Patient's  frequency would include in 1-2 times a day, 3-5 times a week for a duration of 6-12 weeks. Using Netter's Orthopaedic Anatomy, reviewed with the patient the structures involved and how they related to diagnosis. The patient indicated understanding.   The patient was given a handout about classic piriformis stretching including [employer], Modified [employer], my self-described "Sink Stretch," and other piriformis rehab.  We also reviewed hip flexor and abductor strengthening, ham stretching  Rec deep massage, explained self-massage with ball  Proper technique shown and discussed handout in great detail with ATC.  All questions were discussed and answered.     Impression and Recommendations:     The above documentation has been reviewed and is accurate and complete Lyndal Pulley, DO

## 2022-06-08 ENCOUNTER — Encounter: Payer: Self-pay | Admitting: Family Medicine

## 2022-06-08 ENCOUNTER — Ambulatory Visit (INDEPENDENT_AMBULATORY_CARE_PROVIDER_SITE_OTHER): Payer: BC Managed Care – PPO | Admitting: Family Medicine

## 2022-06-08 VITALS — BP 132/92 | HR 69 | Ht 77.0 in | Wt 267.0 lb

## 2022-06-08 DIAGNOSIS — M1711 Unilateral primary osteoarthritis, right knee: Secondary | ICD-10-CM

## 2022-06-08 DIAGNOSIS — G5701 Lesion of sciatic nerve, right lower limb: Secondary | ICD-10-CM

## 2022-06-08 NOTE — Assessment & Plan Note (Signed)
Chronic problem with worsening symptoms again.  Patient given another injection and we will see how patient responds.  Discussed the possibility of viscosupplementation again if necessary.  Open the patient will do relatively well with icing regimen and home exercises.  Patient will be traveling and we decided to do this first and then after traveling can consider the viscosupplementation.  Still thinking about the possibility replacement.

## 2022-06-08 NOTE — Patient Instructions (Signed)
Tennis ball in back pocket Injected knee today See me again in 7 weeks

## 2022-06-08 NOTE — Assessment & Plan Note (Signed)
Right piriformis syndrome.  Discussed with patient about posture and ergonomics, which activities to do and which ones to avoid.  Increase activity slowly.  Discussed tennis ball.  Discussed which activities to do and which ones to avoid.  Will follow-up with me again in 6 to 8 weeks.  Differential includes lumbar radiculopathy that we will monitor.  Patient given exercises by athletic trainer today.

## 2022-06-15 ENCOUNTER — Ambulatory Visit (INDEPENDENT_AMBULATORY_CARE_PROVIDER_SITE_OTHER): Payer: BC Managed Care – PPO | Admitting: Adult Health

## 2022-06-22 ENCOUNTER — Ambulatory Visit (INDEPENDENT_AMBULATORY_CARE_PROVIDER_SITE_OTHER): Payer: BC Managed Care – PPO | Admitting: Adult Health

## 2022-06-22 ENCOUNTER — Encounter: Payer: Self-pay | Admitting: Family Medicine

## 2022-06-22 ENCOUNTER — Encounter (INDEPENDENT_AMBULATORY_CARE_PROVIDER_SITE_OTHER): Payer: Self-pay | Admitting: Adult Health

## 2022-06-22 VITALS — BP 131/82 | HR 63 | Temp 97.8°F | Ht 77.0 in | Wt 262.0 lb

## 2022-06-22 DIAGNOSIS — E559 Vitamin D deficiency, unspecified: Secondary | ICD-10-CM

## 2022-06-22 DIAGNOSIS — Z0000 Encounter for general adult medical examination without abnormal findings: Secondary | ICD-10-CM

## 2022-06-22 DIAGNOSIS — M1711 Unilateral primary osteoarthritis, right knee: Secondary | ICD-10-CM | POA: Diagnosis not present

## 2022-06-22 DIAGNOSIS — E88819 Insulin resistance, unspecified: Secondary | ICD-10-CM

## 2022-06-22 DIAGNOSIS — Z6831 Body mass index (BMI) 31.0-31.9, adult: Secondary | ICD-10-CM

## 2022-06-22 DIAGNOSIS — Q613 Polycystic kidney, unspecified: Secondary | ICD-10-CM

## 2022-06-22 DIAGNOSIS — E669 Obesity, unspecified: Secondary | ICD-10-CM

## 2022-06-22 NOTE — Progress Notes (Unsigned)
WEIGHT SUMMARY AND BIOMETRICS  Vitals Temp: 97.8 F (36.6 C) BP: 131/82 Pulse Rate: 63 SpO2: 96 %   Anthropometric Measurements Height:  (1.956 m) Weight: 262 lb (118.8 kg) BMI (Calculated): 31.06 Weight at Last Visit: 267lb Weight Lost Since Last Visit: 5lb Weight Gained Since Last Visit: 0 Starting Weight: 285lb Total Weight Loss (lbs): 23 lb (10.4 kg)   Body Composition  Body Fat %: 33.3 % Fat Mass (lbs): 87.4 lbs Muscle Mass (lbs): 166.4 lbs Total Body Water (lbs): 127.2 lbs Visceral Fat Rating : 18   Other Clinical Data Fasting: Yes Labs: Yes Today's Visit #: 35 Starting Date: 05/22/19    Chief Complaint:   OBESITY Seth Morales is here to discuss his progress with his obesity treatment plan. He is on the the Category 3 Plan and states he is following his eating plan approximately 60 % of the time.  He states he is exercising gym 30-60 minutes 2-3 times per week.   Interim History:  Seth Morales has experienced rosacea flare that began 3 days again- Doxycycline increased from  QD to BID.  He believes to be on BID therapy for months- he is aware of photosensitivity warning of this ABX.  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 Healthcare maintenance He endorses stable energy levels. He is able to exercise at gym 2-3 x week. 10-15 mins on elliptical  Circuit training on weights. 10-20 mins on stationary bike.  2. Polycystic kidney disease Followed by [facility] in [facility] Q 6 months  3. Primary osteoarthritis of right knee Currently followed by Dr. Terrilee Files He reports increased pain  and edema the last several weeks. He feels that a replacement is imminent- likely this summer.  4. Insulin resistance  Latest Reference Range & Units 05/04/21 11:18 09/29/21 09:52  INSULIN 2.6 - 24.9 uIU/mL 12.8 7.1  Last dose of Mounjaro 7.5 mg 08/20/2021  He reports recent reductions of polyphagia.  5. Vitamin D deficiency  Latest Reference Range & Units 05/04/21 11:18 09/29/21 09:52  Vitamin D, 25-Hydroxy 30.0 - 100.0 ng/mL 74.0 67.7  He endorses stables energy levels. He is on daily OTC Vit D 3 5,000 IU and   Assessment/Plan:   1. Healthcare maintenance Check Labs - Vitamin B12  2. Polycystic kidney disease Check Labs - Comprehensive metabolic panel Avoid Nephrototoxic  substances  3. Primary osteoarthritis of right knee Continue with weight loss efforts. F/u with Sports Med as needed  4. Insulin resistance Check Labs - Hemoglobin A1c - Insulin, random  5. Vitamin D deficiency Check Labs - VITAMIN D 25 Hydroxy (Vit-D Deficiency, Fractures)  6. Obesity, current BMI 31.8  Seth Morales is currently in the action stage of change. As such, his goal is to continue with weight loss efforts. He has agreed to the Category 3 Plan.   Exercise goals: All adults should avoid inactivity. Some physical activity is better than none, and adults who participate in any amount of physical activity gain some health benefits. For additional and more extensive health benefits, adults should increase their aerobic physical activity to 300 minutes (5 hours) a  week of moderate-intensity, or 150 minutes a week of vigorous-intensity aerobic physical activity, or an equivalent combination of moderate- and vigorous-intensity activity. Additional health benefits are gained by engaging in physical activity beyond this amount.  Adults should also include muscle-strengthening activities that involve all major muscle groups on 2 or more days a week.  Behavioral modification strategies: increasing lean  protein intake, decreasing simple carbohydrates, increasing vegetables, increasing water intake, no skipping meals, meal planning and cooking strategies, travel eating strategies, and planning for success.  Seth Morales has agreed to follow-up with our clinic in 4 weeks. He was informed of the importance of frequent follow-up visits to maximize his success with intensive lifestyle modifications for his multiple health conditions.   Seth Morales was informed we would discuss his lab results at his next visit unless there is a critical issue that needs to be addressed sooner. Seth Morales agreed to keep his next visit at the agreed upon time to discuss these results.  Objective:   Blood pressure 131/82, pulse 63, temperature 97.8 F (36.6 C), height  (1.956 m), weight 262 lb (118.8 kg), SpO2 96 %. Body mass index is 31.07 kg/m.  General: Cooperative, alert, well developed, in no acute distress. HEENT: Conjunctivae and lids unremarkable. Cardiovascular: Regular rhythm.  Lungs: Normal work of breathing. Neurologic: No focal deficits.   Lab Results  Component Value Date   CREATININE 1.12 09/29/2021   BUN 23 09/29/2021   NA 143 09/29/2021   K 4.1 09/29/2021   CL 102 09/29/2021   CO2 25 09/29/2021   Lab Results  Component Value Date   ALT 33 09/29/2021   AST 31 09/29/2021   ALKPHOS 68 09/29/2021   BILITOT 0.9 09/29/2021   Lab Results  Component Value Date   HGBA1C 5.4 09/29/2021   HGBA1C 5.3 05/04/2021   HGBA1C 5.5 06/25/2020   HGBA1C 5.4 05/22/2019   HGBA1C 5.4 11/30/2018   Lab Results  Component Value Date   INSULIN 7.1 09/29/2021   INSULIN 12.8 05/04/2021   INSULIN 8.5 06/25/2020   INSULIN 6.0 01/20/2020   INSULIN 14.6 05/22/2019   Lab Results  Component Value Date   TSH 2.970 05/22/2019   Lab Results  Component Value Date   CHOL 180 06/25/2020   HDL 51 06/25/2020   LDLCALC 109 (H) 06/25/2020   LDLDIRECT 114.0 03/14/2019   TRIG 113 06/25/2020   CHOLHDL 3.4 01/20/2020    Lab Results  Component Value Date   VD25OH 67.7 09/29/2021   VD25OH 74.0 05/04/2021   VD25OH 61.9 06/25/2020   Lab Results  Component Value Date   WBC 6.4 01/20/2020   HGB 14.8 01/20/2020   HCT 45.5 01/20/2020   MCV 93 01/20/2020   PLT 177 01/20/2020   No results found for: "IRON", "TIBC", "FERRITIN"   Attestation Statements:   Reviewed by clinician on day of visit: allergies, medications, problem list, medical history, surgical history, family history, social history, and previous encounter notes.  I have reviewed the above documentation for accuracy and completeness, and I agree with the above. -  Reilly Blades d. Kamari Bilek, NP-C

## 2022-06-23 ENCOUNTER — Other Ambulatory Visit: Payer: Self-pay

## 2022-06-23 DIAGNOSIS — M1711 Unilateral primary osteoarthritis, right knee: Secondary | ICD-10-CM

## 2022-06-23 LAB — COMPREHENSIVE METABOLIC PANEL
ALT: 34 IU/L (ref 0–44)
AST: 31 IU/L (ref 0–40)
Albumin/Globulin Ratio: 2.3 — ABNORMAL HIGH (ref 1.2–2.2)
Albumin: 4.2 g/dL (ref 3.9–4.9)
Alkaline Phosphatase: 72 IU/L (ref 44–121)
BUN/Creatinine Ratio: 21 (ref 10–24)
BUN: 26 mg/dL (ref 8–27)
Bilirubin Total: 0.7 mg/dL (ref 0.0–1.2)
CO2: 25 mmol/L (ref 20–29)
Calcium: 9.3 mg/dL (ref 8.6–10.2)
Chloride: 102 mmol/L (ref 96–106)
Creatinine, Ser: 1.21 mg/dL (ref 0.76–1.27)
Globulin, Total: 1.8 g/dL (ref 1.5–4.5)
Glucose: 91 mg/dL (ref 70–99)
Potassium: 3.8 mmol/L (ref 3.5–5.2)
Sodium: 143 mmol/L (ref 134–144)
Total Protein: 6 g/dL (ref 6.0–8.5)
eGFR: 68 mL/min/{1.73_m2} (ref >59)

## 2022-06-23 LAB — VITAMIN D 25 HYDROXY (VIT D DEFICIENCY, FRACTURES): Vit D, 25-Hydroxy: 54 ng/mL (ref 30.0–100.0)

## 2022-06-23 LAB — INSULIN, RANDOM: INSULIN: 8.4 u[IU]/mL (ref 2.6–24.9)

## 2022-06-23 LAB — HEMOGLOBIN A1C
Est. average glucose Bld gHb Est-mCnc: 117 mg/dL
Hgb A1c MFr Bld: 5.7 % — ABNORMAL HIGH (ref 4.8–5.6)

## 2022-06-23 LAB — VITAMIN B12: Vitamin B-12: 540 pg/mL (ref 232–1245)

## 2022-07-21 ENCOUNTER — Ambulatory Visit (INDEPENDENT_AMBULATORY_CARE_PROVIDER_SITE_OTHER): Payer: BC Managed Care – PPO | Admitting: Adult Health

## 2022-07-21 ENCOUNTER — Encounter (INDEPENDENT_AMBULATORY_CARE_PROVIDER_SITE_OTHER): Payer: Self-pay | Admitting: Adult Health

## 2022-07-21 VITALS — BP 141/92 | HR 78 | Temp 98.6°F | Ht 77.0 in | Wt 268.0 lb

## 2022-07-21 DIAGNOSIS — Q613 Polycystic kidney, unspecified: Secondary | ICD-10-CM

## 2022-07-21 DIAGNOSIS — E88819 Insulin resistance, unspecified: Secondary | ICD-10-CM

## 2022-07-21 DIAGNOSIS — I10 Essential (primary) hypertension: Secondary | ICD-10-CM

## 2022-07-21 DIAGNOSIS — M1711 Unilateral primary osteoarthritis, right knee: Secondary | ICD-10-CM | POA: Diagnosis not present

## 2022-07-21 DIAGNOSIS — Z6831 Body mass index (BMI) 31.0-31.9, adult: Secondary | ICD-10-CM

## 2022-07-21 DIAGNOSIS — E669 Obesity, unspecified: Secondary | ICD-10-CM

## 2022-07-21 DIAGNOSIS — E559 Vitamin D deficiency, unspecified: Secondary | ICD-10-CM | POA: Diagnosis not present

## 2022-07-21 NOTE — Progress Notes (Addendum)
WEIGHT SUMMARY AND BIOMETRICS  Vitals Temp: 98.6 F (37 C) BP: (!) 141/92 Pulse Rate: 78 SpO2: 97 %   Anthropometric Measurements Height: 6\' 5"  (1.956 m) Weight: 268 lb (121.6 kg) BMI (Calculated): 31.77 Weight at Last Visit: 262lb Weight Lost Since Last Visit: 0 Weight Gained Since Last Visit: 6lb Starting Weight: 285lb Total Weight Loss (lbs): 17 lb (7.711 kg)   Body Composition  Body Fat %: 33.3 % Fat Mass (lbs): 89.4 lbs Muscle Mass (lbs): 170.2 lbs Total Body Water (lbs): 135.4 lbs Visceral Fat Rating : 18   Other Clinical Data Fasting: no Labs: no Today's Visit #: 36 Starting Date: 05/22/19    Chief Complaint:   OBESITY Seth Morales is here to discuss his progress with his obesity treatment plan. He is on the the Category 3 Plan and states he is following his eating plan approximately 20 % of the time.  He states he is exercising walking 30-45 minutes 5 times per week.   Interim History:  Mr. Lempert just returned from 5 day all inclusive trips to [place] and [place]  Hunger/appetite-he reports increased appetite while on vacation.  He indulged in cocktails and simple CHO snacks- however was quite active during his [place]!  Exercise-he is scheduled for R total knee replacement this summer  [facility] recently stopped his only antihypertensive- HCTZ 25 1/2 tab- water weight increased. He  denies dyspnea or lower extremity edema.  Reviewed Bioempedence Results with pt: Muscle Mass:+ 3.8 lbs Adipose Mass: + 2 lbs Water Weight: + 8.2 lbs  Subjective:   1. Primary osteoarthritis of right knee Discussed Labs 07/13/2022- CMP- Serum creat increased slightly, however still stable  Electrolytes stable He is anticipation Summer 2024 surgical date He will need surgical clearance from his PCP   2. Essential hypertension Discussed Labs 07/13/2022 CMP- Serum creat increased slightly, however still stable  Electrolytes stable  3. Polycystic  kidney disease Discussed Labs 07/13/2022 CMP- Serum creat increased slightly, however still stable  Electrolytes stable [facility] recently stopped his only antihypertensive- HCTZ 25 1/2 tab- water weight increased. He  denies dyspnea or lower extremity edema.  Reviewed Bioempedence Results with pt: Muscle Mass:+ 3.8 lbs Adipose Mass: + 2 lbs Water Weight: + 8.2 lbs  4. Vitamin D deficiency Discussed Labs  Latest Reference Range & Units 09/29/21 09:52 06/22/22 08:07  Vitamin D, 25-Hydroxy 30.0 - 100.0 ng/mL 67.7 54.0  Vit D Level- Stable He is on daily OTC Multivitamin and Vit D 3 5,000 IU QD He endorses stable energy levels  5. Insulin resistance Discussed Labs  Latest Reference Range & Units 06/22/22 08:07  Glucose 70 - 99 mg/dL 91  Hemoglobin Z6X 4.8 - 5.6 % 5.7 (H)  Est. average glucose Bld gHb Est-mCnc mg/dL 096  INSULIN 2.6 - 04.5 uIU/mL 8.4  (H): Data is abnormally high  Assessment/Plan:   1. Primary osteoarthritis of right knee F/U with PCP and Orthopedic Surgeon  2. Essential hypertension Reduce Na+ Increase activity as tolerated F/U with Nephrology ASAP  3. Polycystic kidney disease F/U with [facility] as directed  4. Vitamin D deficiency Continue current OTC supplementation  5. Insulin resistance Limit sugar/CHO Increase protein and activity  6. Obesity, current BMI 31.77  Jaziyah is currently in the action stage of change. As such, his goal is to continue with weight loss efforts. He has agreed to the Category 3 Plan.   Exercise goals: All adults should avoid inactivity. Some physical activity is better than  none, and adults who participate in any amount of physical activity gain some health benefits.  Behavioral modification strategies: increasing lean protein intake, decreasing simple carbohydrates, increasing vegetables, increasing water intake, decreasing alcohol intake, decreasing eating out, no skipping meals, meal planning and cooking  strategies, travel eating strategies, and planning for success.  Lorraine has agreed to follow-up with our clinic in 5 weeks. He was informed of the importance of frequent follow-up visits to maximize his success with intensive lifestyle modifications for his multiple health conditions.   Objective:   Blood pressure (!) 141/92, pulse 78, temperature 98.6 F (37 C), height 6\' 5"  (1.956 m), weight 268 lb (121.6 kg), SpO2 97 %. Body mass index is 31.78 kg/m.  General: Cooperative, alert, well developed, in no acute distress. HEENT: Conjunctivae and lids unremarkable. Cardiovascular: Regular rhythm.  Lungs: Normal work of breathing. Neurologic: No focal deficits.   Lab Results  Component Value Date   CREATININE 1.21 06/22/2022   BUN 26 06/22/2022   NA 143 06/22/2022   K 3.8 06/22/2022   CL 102 06/22/2022   CO2 25 06/22/2022   Lab Results  Component Value Date   ALT 34 06/22/2022   AST 31 06/22/2022   ALKPHOS 72 06/22/2022   BILITOT 0.7 06/22/2022   Lab Results  Component Value Date   HGBA1C 5.7 (H) 06/22/2022   HGBA1C 5.4 09/29/2021   HGBA1C 5.3 05/04/2021   HGBA1C 5.5 06/25/2020   HGBA1C 5.4 05/22/2019   Lab Results  Component Value Date   INSULIN 8.4 06/22/2022   INSULIN 7.1 09/29/2021   INSULIN 12.8 05/04/2021   INSULIN 8.5 06/25/2020   INSULIN 6.0 01/20/2020   Lab Results  Component Value Date   TSH 2.970 05/22/2019   Lab Results  Component Value Date   CHOL 180 06/25/2020   HDL 51 06/25/2020   LDLCALC 109 (H) 06/25/2020   LDLDIRECT 114.0 03/14/2019   TRIG 113 06/25/2020   CHOLHDL 3.4 01/20/2020   Lab Results  Component Value Date   VD25OH 54.0 06/22/2022   VD25OH 67.7 09/29/2021   VD25OH 74.0 05/04/2021   Lab Results  Component Value Date   WBC 6.4 01/20/2020   HGB 14.8 01/20/2020   HCT 45.5 01/20/2020   MCV 93 01/20/2020   PLT 177 01/20/2020   No results found for: "IRON", "TIBC", "FERRITIN"  Attestation Statements:   Reviewed by  clinician on day of visit: allergies, medications, problem list, medical history, surgical history, family history, social history, and previous encounter notes.  I have reviewed the above documentation for accuracy and completeness, and I agree with the above. -  Iva Posten d. Zayvion Stailey, NP-C

## 2022-07-25 ENCOUNTER — Emergency Department (HOSPITAL_BASED_OUTPATIENT_CLINIC_OR_DEPARTMENT_OTHER)
Admission: EM | Admit: 2022-07-25 | Discharge: 2022-07-25 | Disposition: A | Discharge status: Home / Self Care | Payer: BC Managed Care – PPO | Attending: Emergency Medicine | Admitting: Emergency Medicine

## 2022-07-25 ENCOUNTER — Other Ambulatory Visit: Payer: Self-pay

## 2022-07-25 ENCOUNTER — Emergency Department (HOSPITAL_BASED_OUTPATIENT_CLINIC_OR_DEPARTMENT_OTHER): Payer: BC Managed Care – PPO

## 2022-07-25 ENCOUNTER — Encounter (HOSPITAL_BASED_OUTPATIENT_CLINIC_OR_DEPARTMENT_OTHER): Payer: Self-pay | Admitting: Emergency Medicine

## 2022-07-25 DIAGNOSIS — I159 Secondary hypertension, unspecified: Secondary | ICD-10-CM | POA: Diagnosis not present

## 2022-07-25 DIAGNOSIS — I10 Essential (primary) hypertension: Secondary | ICD-10-CM | POA: Insufficient documentation

## 2022-07-25 LAB — CBC WITH DIFFERENTIAL/PLATELET
Abs Immature Granulocytes: 0.05 10*3/uL (ref 0.00–0.07)
Basophils Absolute: 0.1 10*3/uL (ref 0.0–0.1)
Basophils Relative: 1 %
Eosinophils Absolute: 0.2 10*3/uL (ref 0.0–0.5)
Eosinophils Relative: 2 %
HCT: 41 % (ref 39.0–52.0)
Hemoglobin: 13.7 g/dL (ref 13.0–17.0)
Immature Granulocytes: 1 %
Lymphocytes Relative: 21 %
Lymphs Abs: 1.7 10*3/uL (ref 0.7–4.0)
MCH: 30 pg (ref 26.0–34.0)
MCHC: 33.4 g/dL (ref 30.0–36.0)
MCV: 89.9 fL (ref 80.0–100.0)
Monocytes Absolute: 0.6 10*3/uL (ref 0.1–1.0)
Monocytes Relative: 8 %
Neutro Abs: 5.7 10*3/uL (ref 1.7–7.7)
Neutrophils Relative %: 67 %
Platelets: 199 10*3/uL (ref 150–400)
RBC: 4.56 MIL/uL (ref 4.22–5.81)
RDW: 13.3 % (ref 11.5–15.5)
WBC: 8.3 10*3/uL (ref 4.0–10.5)
nRBC: 0 % (ref 0.0–0.2)

## 2022-07-25 LAB — BASIC METABOLIC PANEL
Anion gap: 10 (ref 5–15)
BUN: 28 mg/dL — ABNORMAL HIGH (ref 8–23)
CO2: 26 mmol/L (ref 22–32)
Calcium: 8.7 mg/dL — ABNORMAL LOW (ref 8.9–10.3)
Chloride: 101 mmol/L (ref 98–111)
Creatinine, Ser: 1.24 mg/dL (ref 0.61–1.24)
GFR, Estimated: >60 mL/min (ref >60)
Glucose, Bld: 118 mg/dL — ABNORMAL HIGH (ref 70–99)
Potassium: 3.7 mmol/L (ref 3.5–5.1)
Sodium: 137 mmol/L (ref 135–145)

## 2022-07-25 MED ORDER — HYDROCHLOROTHIAZIDE 25 MG PO TABS
12.5000 mg | ORAL_TABLET | Freq: Once | ORAL | Status: AC
  Administered 2022-07-25: 12.5 mg via ORAL
  Filled 2022-07-25: qty 1

## 2022-07-25 MED ORDER — HYDROCHLOROTHIAZIDE 12.5 MG PO TABS: 12.5000 mg | ORAL_TABLET | Freq: Every day | ORAL | 1 refills | Status: DC

## 2022-07-25 NOTE — ED Provider Notes (Signed)
[place] [facility] Provider Note   CSN: [id number] Arrival date & time: 07/25/22  2124     History  Chief Complaint  Patient presents with   Hypertension    Seth Morales is a 62 y.o. male.  Patient here with high blood pressure.  History of renal transplant.  Was recently stopped on his hydrochlorothiazide because his blood pressure has been doing well.  However he just recently went on an all-inclusive trip states that his diet got a little bit of out-of-control.  He was feeling little bit off today and noticed that his blood pressure was high.  He had some nausea.  He no longer has any symptoms.  He is not having a headache or numbness or weakness or vision changes.  No abdominal pain or chest pain or shortness of breath.  Denies any stroke symptoms.  Overall he thinks that he probably needs to get back on some blood pressure medicine.  The history is provided by the patient.       Home Medications Prior to Admission medications   Medication Sig Start Date End Date Taking? Authorizing Provider  hydrochlorothiazide (HYDRODIURIL) 12.5 MG tablet Take 1 tablet (12.5 mg total) by mouth daily. 07/25/22 08/24/22 Yes Alarik Radu, DO  allopurinol (ZYLOPRIM) 100 MG tablet Take 100 mg by mouth daily.    [provider]  Cholecalciferol (VITAMIN D) 125 MCG (5000 UT) CAPS Take 5,000 Units by mouth daily.     [provider]  cycloSPORINE modified (NEORAL) 100 MG capsule Take 100 mg by mouth 2 (two) times daily. 08/10/19   [provider]  cycloSPORINE modified (NEORAL) 25 MG capsule  08/12/19   [provider]  doxycycline (MONODOX) 100 MG capsule Take 100 mg by mouth 2 (two) times daily. 08/29/20   [provider]  magnesium oxide-pyridoxine (BEELITH) 362-20 MG TABS Take 1 tablet by mouth daily.    [provider]  Multiple Vitamin (MULTI-VITAMIN) tablet Take by mouth.    [provider]   mycophenolate (CELLCEPT) 250 MG capsule Take 750 mg by mouth 2 (two) times daily. 08/10/19   [provider]  Omega-3 Fatty Acids (FISH OIL PO) Take by mouth.    [provider]  predniSONE (DELTASONE) 5 MG tablet Take 1 tablet (5 mg total) by mouth daily with breakfast. 07/06/16   Helane Rima, DO  selenium sulfide (SELSUN) 2.5 % shampoo Apply topically as directed. 07/15/19   [provider]  Sulfacetamide Sodium-Sulfur 8-4 % SUSP  10/16/18   [provider]      Allergies    Patient has no known allergies.    Review of Systems   Review of Systems  Physical Exam Updated Vital Signs BP (!) 178/90   Pulse (!) 58   Temp 98 F (36.7 C)   Resp 18   Ht 6\' 5"  (1.956 m)   Wt 121.6 kg   SpO2 96%   BMI 31.78 kg/m  Physical Exam Vitals and nursing note reviewed.  Constitutional:      General: He is not in acute distress.    Appearance: He is well-developed. He is not ill-appearing.  HENT:     Head: Normocephalic and atraumatic.     Nose: Nose normal.     Mouth/Throat:     Mouth: Mucous membranes are moist.  Eyes:     Extraocular Movements: Extraocular movements intact.     Conjunctiva/sclera: Conjunctivae normal.     Pupils: Pupils  are equal, round, and reactive to light.  Cardiovascular:     Rate and Rhythm: Normal rate and regular rhythm.     Pulses: Normal pulses.     Heart sounds: Normal heart sounds. No murmur heard. Pulmonary:     Effort: Pulmonary effort is normal. No respiratory distress.     Breath sounds: Normal breath sounds.  Abdominal:     Palpations: Abdomen is soft.     Tenderness: There is no abdominal tenderness.  Musculoskeletal:        General: No swelling.     Cervical back: Normal range of motion and neck supple.  Skin:    General: Skin is warm and dry.     Capillary Refill: Capillary refill takes less than 2 seconds.  Neurological:     General: No focal deficit present.     Mental Status: He is alert and oriented  to person, place, and time.     Cranial Nerves: No cranial nerve deficit.     Sensory: No sensory deficit.     Motor: No weakness.     Coordination: Coordination normal.     Gait: Gait normal.     Comments: 5+ out of 5 strength, normal sensation, normal speech, normal visual fields  Psychiatric:        Mood and Affect: Mood normal.     ED Results / Procedures / Treatments   Labs (all labs ordered are listed, but only abnormal results are displayed) Labs Reviewed  BASIC METABOLIC PANEL - Abnormal; Notable for the following components:      Result Value   Glucose, Bld 118 (*)    BUN 28 (*)    Calcium 8.7 (*)    All other components within normal limits  CBC WITH DIFFERENTIAL/PLATELET    EKG EKG Interpretation  Date/Time:  Monday Jul 25 2022 21:33:52 EDT Ventricular Rate:  56 PR Interval:  182 QRS Duration: 113 QT Interval:  417 QTC Calculation: 403 R Axis:   58 Text Interpretation: Sinus rhythm Borderline intraventricular conduction delay Confirmed by Virgina Norfolk ([id number]) on 07/25/2022 9:37:54 PM  Radiology DG Chest 2 View  Result Date: 07/25/2022 CLINICAL DATA:  Chest pain, headache, nausea EXAM: CHEST - 2 VIEW COMPARISON:  Chest radiographs 10/16/2017 FINDINGS: The heart size and mediastinal contours are within normal limits. Both lungs are clear. The visualized skeletal structures are unremarkable. IMPRESSION: No active cardiopulmonary disease. Electronically Signed   By: Minerva Fester M.D.   On: 07/25/2022 21:52    Procedures Procedures    Medications Ordered in ED Medications  hydrochlorothiazide (HYDRODIURIL) tablet 12.5 mg (12.5 mg Oral Given 07/25/22 2227)    ED Course/ Medical Decision Making/ A&P                             Medical Decision Making Amount and/or Complexity of Data Reviewed Labs: ordered. Radiology: ordered.  Risk Prescription drug management.   Seth Morales is here with high blood pressure.  History of renal transplant.  Used  to be on hydrochlorothiazide but recently stopped as he has been doing well after significant weight loss.  Nephrologist recently stopped it.  He just went on a vacation that was all-inclusive.  He states that his diet got a little out of hand.  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  Got a little bit nauseous and checked his blood pressure and noticed that it has been high.  He had maybe  a mild headache but currently has no symptoms.  No stroke symptoms.  No chest pain.  Blood pressure is 185/92 but otherwise normal vitals.  He is well-appearing.  Neurologically he is intact.  He is pretty much asymptomatic.  Shared decision was made to hold off on any head imaging.  Will restart him on his hydrochlorothiazide and have him follow-up with his nephrologist.  Chest x-ray per my review and interpretation showed no evidence of pneumonia or volume overload.  EKG shows sinus rhythm, No ischemic changes.  No significant anemia or electrolyte abnormality.  Creatinine is at baseline.  Overall we will restart him back on his blood pressure medicine he will follow-up with his nephrologist.  Discharged in good condition.  This chart was dictated using voice recognition software.  Despite best efforts to proofread,  errors can occur which can change the documentation meaning.         Final Clinical Impression(s) / ED Diagnoses Final diagnoses:  Secondary hypertension    Rx / DC Orders ED Discharge Orders          Ordered    hydrochlorothiazide (HYDRODIURIL) 12.5 MG tablet  Daily        07/25/22 2229              Virgina Norfolk, DO 07/25/22 2230

## 2022-07-25 NOTE — ED Triage Notes (Signed)
Patient arrived via POV c/o hypertension x 6hr pta. Patient states headache, some nausea. Patient states being kidney transplant patient. Patient recently stopped BP medication per nephrology. Patient states 4/10 headache. Patient is AO x 4, VS w/ elevated BP, normal gait.

## 2022-07-25 NOTE — Discharge Instructions (Signed)
Blood work today is unremarkable.  Your kidney function is at its baseline.  Continue to check your blood pressures twice a day and follow-up with their primary care doctor and nephrologist.  If you have any worsening symptoms please return for evaluation.

## 2022-07-25 NOTE — ED Notes (Signed)
Pt is a past kidney transplant pt and has recently been taken off of BP meds by nephrology.   Pt noticed his bp was elevated today 6 hrs PTA and came to be evaluated.   Pt does admit to just getting back from vacation and prob over endulged

## 2022-07-26 NOTE — Progress Notes (Unsigned)
[facility] [address] [place] [address] Phone: [phone] Subjective:   Seth Morales, am serving as a scribe for Dr. Antoine Primas.  I'm seeing this patient by the request  of:  Truett Perna, MD  CC: Right hip and right knee pain  [id number]  06/08/2022 Right piriformis syndrome.  Discussed with patient about posture and ergonomics, which activities to do and which ones to avoid.  Increase activity slowly.  Discussed tennis ball.  Discussed which activities to do and which ones to avoid.  Will follow-up with me again in 6 to 8 weeks.  Differential includes lumbar radiculopathy that we will monitor.  Patient given exercises by athletic trainer today.     Chronic problem with worsening symptoms again.  Patient given another injection and we will see how patient responds.  Discussed the possibility of viscosupplementation again if necessary.  Open the patient will do relatively well with icing regimen and home exercises.  Patient will be traveling and we decided to do this first and then after traveling can consider the viscosupplementation.  Still thinking about the possibility replacement.     Update 07/27/2022 Seth Morales is a 62 y.o. male coming in with complaint of R hip and R knee pain. Patient states knee surgery this summer. Having L hip and R shoulder and wrist pain. Going to PT right now to strengthen lower body. During vacation fell on wrist and had some swelling in the thumb/scaphoid area. Has been wearing brace at night to help.       Past Medical History:  Diagnosis Date   Allergy    Anemia    Back pain    Bilateral swelling of feet    Blood transfusion without reported diagnosis 13 years ago    Chronic kidney disease    Dermatitis    Hypertension    Joint pain    Kidney transplanted    Vitamin D deficiency    Past Surgical History:  Procedure Laterality Date   COLONOSCOPY  09/06/2012   at [place]   EYE  SURGERY     INSERTION OF DIALYSIS CATHETER     KIDNEY TRANSPLANT  2006   KNEE ARTHROPLASTY Bilateral    NEPHRECTOMY Bilateral    Social History   Socioeconomic History   Marital status: Married    Spouse name: Not on file   Number of children: Not on file   Years of education: Not on file   Highest education level: Not on file  Occupational History   Not on file  Tobacco Use   Smoking status: Never   Smokeless tobacco: Never  Vaping Use   Vaping Use: Never used  Substance and Sexual Activity   Alcohol use: Yes    Alcohol/week: 2.0 standard drinks of alcohol    Types: 2 Standard drinks or equivalent per week   Drug use: No   Sexual activity: Not on file  Other Topics Concern   Not on file  Social History Narrative   Not on file   Social Determinants of Health   Financial Resource Strain: Not on file  Food Insecurity: Not on file  Transportation Needs: Not on file  Physical Activity: Not on file  Stress: Not on file  Social Connections: Not on file   No Known Allergies Family History  Problem Relation Age of Onset   Arthritis Mother    Hypertension Mother    Kidney disease Mother    Diabetes Mother  Diabetes Father    Colon cancer Neg Hx    Colon polyps Neg Hx    Esophageal cancer Neg Hx    Rectal cancer Neg Hx    Stomach cancer Neg Hx    Pancreatic cancer Neg Hx     Current Outpatient Medications (Endocrine & Metabolic):    predniSONE (DELTASONE) 5 MG tablet, Take 1 tablet (5 mg total) by mouth daily with breakfast.  Current Outpatient Medications (Cardiovascular):    hydrochlorothiazide (HYDRODIURIL) 12.5 MG tablet, Take 1 tablet (12.5 mg total) by mouth daily.   Current Outpatient Medications (Analgesics):    allopurinol (ZYLOPRIM) 100 MG tablet, Take 100 mg by mouth daily.   Current Outpatient Medications (Other):    Cholecalciferol (VITAMIN D) 125 MCG (5000 UT) CAPS, Take 5,000 Units by mouth daily.    cycloSPORINE modified (NEORAL) 100 MG  capsule, Take 100 mg by mouth 2 (two) times daily.   cycloSPORINE modified (NEORAL) 25 MG capsule,    doxycycline (MONODOX) 100 MG capsule, Take 100 mg by mouth 2 (two) times daily.   magnesium oxide-pyridoxine (BEELITH) 362-20 MG TABS, Take 1 tablet by mouth daily.   Multiple Vitamin (MULTI-VITAMIN) tablet, Take by mouth.   mycophenolate (CELLCEPT) 250 MG capsule, Take 750 mg by mouth 2 (two) times daily.   Omega-3 Fatty Acids (FISH OIL PO), Take by mouth.   selenium sulfide (SELSUN) 2.5 % shampoo, Apply topically as directed.   Sulfacetamide Sodium-Sulfur 8-4 % SUSP,    Reviewed prior external information including notes and imaging from  primary care provider As well as notes that were available from care everywhere and other healthcare systems.  Past medical history, social, surgical and family history all reviewed in electronic medical record.  No pertanent information unless stated regarding to the chief complaint.   Review of Systems:  No headache, visual changes, nausea, vomiting, diarrhea, constipation, dizziness, abdominal pain, skin rash, fevers, chills, night sweats, weight loss, swollen lymph nodes, body aches, joint swelling, chest pain, shortness of breath, mood changes. POSITIVE muscle aches  Objective  Blood pressure 134/84, pulse 79, height 6\' 5"  (1.956 m), weight 267 lb (121.1 kg), SpO2 96 %.   General: No apparent distress alert and oriented x3 mood and affect normal, dressed appropriately.  HEENT: Pupils equal, extraocular movements intact  Respiratory: Patient's speak in full sentences and does not appear short of breath  Cardiovascular: No lower extremity edema, non tender, no erythema  Right hip exam does have some mild discomfort.  Right arm does have arthritic changes noted as well with instability. Left hip severe tenderness to palpation over the greater trochanteric area.  Positive FABER test noted.  Right shoulder exam does have positive crossover noted.  On  inspection there is swelling over the acromioclavicular joint   Procedure: Real-time Ultrasound Guided Injection of right acromioclavicular joint Device: GE Logiq Q7 Ultrasound guided injection is preferred based studies that show increased duration, increased effect, greater accuracy, decreased procedural pain, increased response rate, and decreased cost with ultrasound guided versus blind injection.  Verbal informed consent obtained.  Time-out conducted.  Noted no overlying erythema, induration, or other signs of local infection.  Skin prepped in a sterile fashion.  Local anesthesia: Topical Ethyl chloride.  With sterile technique and under real time ultrasound guidance: With a 25-gauge half inch needle injected with 0.5 cc of 0.5% Marcaine and 0.5 cc of Kenalog 40 mg/mL Completed without difficulty  Pain immediately resolved suggesting accurate placement of the medication.  Advised to call if  fevers/chills, erythema, induration, drainage, or persistent bleeding.  Impression: Technically successful ultrasound guided injection.    Procedure: Real-time Ultrasound Guided Injection of left  greater trochanteric bursitis secondary to patient's body habitus Device: GE Logiq Q7  Ultrasound guided injection is preferred based studies that show increased duration, increased effect, greater accuracy, decreased procedural pain, increased response rate, and decreased cost with ultrasound guided versus blind injection.  Verbal informed consent obtained.  Time-out conducted.  Noted no overlying erythema, induration, or other signs of local infection.  Skin prepped in a sterile fashion.  Local anesthesia: Topical Ethyl chloride.  With sterile technique and under real time ultrasound guidance:  Greater trochanteric area was visualized and patient's bursa was noted. A 22-gauge 3 inch needle was inserted and 4 cc of 0.5% Marcaine and 1 cc of Kenalog 40 mg/dL was injected. Pictures taken Completed without  difficulty  Pain immediately resolved suggesting accurate placement of the medication.  Advised to call if fevers/chills, erythema, induration, drainage, or persistent bleeding.   Impression: Technically successful ultrasound guided injection.    Impression and Recommendations:    The above documentation has been reviewed and is accurate and complete Judi Saa, DO

## 2022-07-27 ENCOUNTER — Encounter: Payer: Self-pay | Admitting: Family Medicine

## 2022-07-27 ENCOUNTER — Ambulatory Visit: Payer: BC Managed Care – PPO | Admitting: Family Medicine

## 2022-07-27 ENCOUNTER — Other Ambulatory Visit: Payer: Self-pay

## 2022-07-27 VITALS — BP 134/84 | HR 79 | Ht 77.0 in | Wt 267.0 lb

## 2022-07-27 DIAGNOSIS — M25511 Pain in right shoulder: Secondary | ICD-10-CM

## 2022-07-27 DIAGNOSIS — M19011 Primary osteoarthritis, right shoulder: Secondary | ICD-10-CM | POA: Diagnosis not present

## 2022-07-27 DIAGNOSIS — M7062 Trochanteric bursitis, left hip: Secondary | ICD-10-CM | POA: Diagnosis not present

## 2022-07-27 NOTE — Assessment & Plan Note (Signed)
Chronic problem with worsening symptoms.  Needed to over 10 months.  Discussed icing regimen and home exercises, discussed which activities to do and which ones to avoid.  Increase activity slowly over the course of next several weeks.  Follow-up with me again in 6 to 8 weeks otherwise.  Patient may be having surgical intervention at some other point.

## 2022-07-27 NOTE — Assessment & Plan Note (Signed)
Chronic problem with worsening symptoms.  Likely secondary to patient having worsening symptoms.  Likely of his knee.  Discussed with patient about which activities to do and which ones to avoid.  Discussed increasing activity slowly.  Follow-up with me again 6 to 8 weeks.

## 2022-07-27 NOTE — Patient Instructions (Signed)
Injection in shoulder and hip today

## 2022-08-12 ENCOUNTER — Encounter (HOSPITAL_COMMUNITY): Payer: Self-pay

## 2022-08-22 ENCOUNTER — Other Ambulatory Visit: Payer: Self-pay | Admitting: Orthopaedic Surgery

## 2022-08-25 ENCOUNTER — Ambulatory Visit (INDEPENDENT_AMBULATORY_CARE_PROVIDER_SITE_OTHER): Payer: BC Managed Care – PPO | Admitting: Adult Health

## 2022-09-05 ENCOUNTER — Encounter: Payer: Self-pay | Admitting: Family Medicine

## 2022-09-14 ENCOUNTER — Ambulatory Visit (INDEPENDENT_AMBULATORY_CARE_PROVIDER_SITE_OTHER): Payer: BC Managed Care – PPO | Admitting: Adult Health

## 2022-09-14 ENCOUNTER — Encounter (INDEPENDENT_AMBULATORY_CARE_PROVIDER_SITE_OTHER): Payer: Self-pay | Admitting: Adult Health

## 2022-09-14 VITALS — BP 129/84 | HR 69 | Temp 98.0°F | Ht 77.0 in | Wt 257.0 lb

## 2022-09-14 DIAGNOSIS — Z6830 Body mass index (BMI) 30.0-30.9, adult: Secondary | ICD-10-CM | POA: Diagnosis not present

## 2022-09-14 DIAGNOSIS — M1711 Unilateral primary osteoarthritis, right knee: Secondary | ICD-10-CM

## 2022-09-14 DIAGNOSIS — E669 Obesity, unspecified: Secondary | ICD-10-CM

## 2022-09-14 DIAGNOSIS — Q613 Polycystic kidney, unspecified: Secondary | ICD-10-CM

## 2022-09-14 NOTE — Progress Notes (Signed)
WEIGHT SUMMARY AND BIOMETRICS  Vitals Temp: 98 F (36.7 C) BP: 129/84 Pulse Rate: 69 SpO2: 97 %   Anthropometric Measurements Height: 6\' 5"  (1.956 m) Weight: 257 lb (116.6 kg) BMI (Calculated): 30.47 Weight at Last Visit: 268lb Weight Lost Since Last Visit: 11lb Weight Gained Since Last Visit: 0 Starting Weight: 285lb Total Weight Loss (lbs): 28 lb (12.7 kg)   Body Composition  Body Fat %: 23 % Fat Mass (lbs): 59.2 lbs Muscle Mass (lbs): 189 lbs Total Body Water (lbs): 133.4 lbs Visceral Fat Rating : 13   Other Clinical Data Fasting: no Labs: no Today's Visit #: 37 Starting Date: 05/22/19    Chief Complaint:   OBESITY Seth Morales is here to discuss his progress with his obesity treatment plan. He is on the the Category 3 Plan and states he is following his eating plan approximately 70 % of the time. He states he is exercising Walking 60 minutes 3-4 times per week.   Interim History:  Due to uncontrolled HTN and lower ext edema- BMI Nephrology restarted antihypertensive therapy.  08/10/2022 started Amlodipine 5mg   08/25/2022 started Amlodipine 10mg , re-started hydrochlorothiazide 12.5mg  (take 25mg  if lower ext edema worsens)  BP at goal today!  Reviewed Bioempedence results with pt: Muscle Mass: +18.8 lbs Adipose Mass: -30.2 lbs Water Weight: - 1 lb  Subjective:   1. Primary osteoarthritis of right knee Due to length of recovery he has postponed R total Knee Arthroplasty until May 2025. He reports R knee is stable and he is working with Sports Med to manage sx's  2. Polycystic kidney disease Due to uncontrolled HTN and lower ext edema- BMI Nephrology restarted antihypertensive therapy.  08/10/2022 started Amlodipine 5mg   08/25/2022 started Amlodipine 10mg , re-started hydrochlorothiazide 12.5mg  (take 25mg  if lower ext edema worsens)  09/07/2022- BMI Nephrology: No change to current antihypertensive regime and instructed him "drink to thirst".  Per pt  home readings SBP 120-130s DBP 70-80  He denies CP with exertion  Assessment/Plan:   1. Primary osteoarthritis of right knee Continue with weight loss efforts and f/u with Sports med to manage sx's  2. Polycystic kidney disease Continue antihypertensive therapy per BMI Nephrology PO intake per Nophrology Monitor home BP F/U with  BMI Nephrology asa directed  3. Obesity, current BMI 30.47  Seth Morales is currently in the action stage of change. As such, his goal is to continue with weight loss efforts. He has agreed to the Category 3 Plan.   Exercise goals: All adults should avoid inactivity. Some physical activity is better than none, and adults who participate in any amount of physical activity gain some health benefits. Adults should also include muscle-strengthening activities that involve all major muscle groups on 2 or more days a week.  Behavioral modification strategies: increasing lean protein intake, decreasing simple carbohydrates, increasing vegetables, decreasing sodium intake, meal planning and cooking strategies, travel eating strategies, and planning for success.  Seth Morales has agreed to follow-up with our clinic in 4 weeks. He was informed of the importance of frequent follow-up visits to maximize his success with intensive lifestyle modifications for his multiple health conditions.   Objective:   Blood pressure 129/84, pulse 69, temperature 98 F (36.7 C), height 6\' 5"  (1.956 m), weight 257 lb (116.6 kg), SpO2 97 %. Body mass index is 30.48 kg/m.  General: Cooperative, alert, well developed, in no acute distress. HEENT: Conjunctivae and lids unremarkable. Cardiovascular: Regular rhythm.  Lungs: Normal work of breathing. Neurologic: No focal deficits.  Lab Results  Component Value Date   CREATININE 1.24 07/25/2022   BUN 28 (H) 07/25/2022   NA 137 07/25/2022   K 3.7 07/25/2022   CL 101 07/25/2022   CO2 26 07/25/2022   Lab Results  Component Value Date    ALT 34 06/22/2022   AST 31 06/22/2022   ALKPHOS 72 06/22/2022   BILITOT 0.7 06/22/2022   Lab Results  Component Value Date   HGBA1C 5.7 (H) 06/22/2022   HGBA1C 5.4 09/29/2021   HGBA1C 5.3 05/04/2021   HGBA1C 5.5 06/25/2020   HGBA1C 5.4 05/22/2019   Lab Results  Component Value Date   INSULIN 8.4 06/22/2022   INSULIN 7.1 09/29/2021   INSULIN 12.8 05/04/2021   INSULIN 8.5 06/25/2020   INSULIN 6.0 01/20/2020   Lab Results  Component Value Date   TSH 2.970 05/22/2019   Lab Results  Component Value Date   CHOL 180 06/25/2020   HDL 51 06/25/2020   LDLCALC 109 (H) 06/25/2020   LDLDIRECT 114.0 03/14/2019   TRIG 113 06/25/2020   CHOLHDL 3.4 01/20/2020   Lab Results  Component Value Date   VD25OH 54.0 06/22/2022   VD25OH 67.7 09/29/2021   VD25OH 74.0 05/04/2021   Lab Results  Component Value Date   WBC 8.3 07/25/2022   HGB 13.7 07/25/2022   HCT 41.0 07/25/2022   MCV 89.9 07/25/2022   PLT 199 07/25/2022   No results found for: "IRON", "TIBC", "FERRITIN"  Attestation Statements:   Reviewed by clinician on day of visit: allergies, medications, problem list, medical history, surgical history, family history, social history, and previous encounter notes.  Time spent on visit including pre-visit chart review and post-visit care and charting was 28 minutes.   I have reviewed the above documentation for accuracy and completeness, and I agree with the above. -  Marcelino Campos d. Xzayvion Vaeth, NP-C

## 2022-09-21 ENCOUNTER — Encounter (HOSPITAL_COMMUNITY): Admission: RE | Admit: 2022-09-21 | Payer: BC Managed Care – PPO | Source: Ambulatory Visit

## 2022-09-23 ENCOUNTER — Ambulatory Visit: Payer: BC Managed Care – PPO | Admitting: Sports Medicine

## 2022-09-23 VITALS — BP 128/82 | HR 69 | Ht 77.0 in | Wt 264.0 lb

## 2022-09-23 DIAGNOSIS — M7062 Trochanteric bursitis, left hip: Secondary | ICD-10-CM

## 2022-09-23 NOTE — Progress Notes (Signed)
[name] [facility] [address] [place] [address] Phone: [phone]   Assessment and Plan:     1. 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 Discussed that ideally we would wait 3 months minimum before repeat injections. Patient has had some benefit over the past 48 hours with conservative therapy so agrees to wait for CSI - continue tylenol, topical antiinflammatory cream, home exercises - do not recommend NSAID use due to history of kidney transplant  - offered prednisone dospak, which patient declines today, but he can call back for prescription if pain continues or worsens    Pertinent previous records reviewed include none   Follow Up: as needed. can call back for prescription if pain continues or worsens      Subjective:   I, [name], am serving as a Neurosurgeon for Doctor Richardean Sale  Chief Complaint: left hip pain   HPI:  06/08/2022 Right piriformis syndrome.  Discussed with patient about posture and ergonomics, which activities to do and which ones to avoid.  Increase activity slowly.  Discussed tennis ball.  Discussed which activities to do and which ones to avoid.  Will follow-up with me again in 6 to 8 weeks.  Differential includes lumbar radiculopathy that we will monitor.  Patient given exercises by athletic trainer today.    Chronic problem with worsening symptoms again.  Patient given another injection and we will see how patient responds.  Discussed the possibility of viscosupplementation again if necessary.  Open the patient will do relatively well with icing regimen and home exercises.  Patient will be traveling and we decided to do this first and then after  traveling can consider the viscosupplementation.  Still thinking about the possibility replacement.    Update 07/27/2022 Seth Morales is a 62 y.o. male coming in with complaint of R hip and R knee pain. Patient states knee surgery this summer. Having L hip and R shoulder and wrist pain. Going to PT right now to strengthen lower body. During vacation fell on wrist and had some swelling in the thumb/scaphoid area. Has been wearing brace at night to help.  09/23/22 Patient states that he had a flare that started yesterday, he has been sleeping on a different bed pain isnt so bad today , he has taken aleve yesterday and that helped   Relevant Historical Information: HTN, history kidney transplant   Additional pertinent review of systems negative.   Current Outpatient Medications:    allopurinol (ZYLOPRIM) 100 MG tablet, Take 100 mg by mouth daily., Disp: , Rfl:    amLODipine (NORVASC) 10 MG tablet, Take 10 mg by mouth daily., Disp: , Rfl:    Cholecalciferol (VITAMIN D) 125 MCG (5000 UT) CAPS, Take 5,000 Units by mouth daily. , Disp: , Rfl:    cycloSPORINE modified (NEORAL) 100 MG capsule, Take 100 mg by mouth 2 (two) times daily., Disp: , Rfl:    cycloSPORINE modified (NEORAL) 25 MG capsule, , Disp: , Rfl:    doxycycline (MONODOX) 100 MG capsule, Take 100 mg by mouth 2 (two) times daily., Disp: , Rfl:    magnesium oxide-pyridoxine (BEELITH) 362-20 MG TABS, Take 1 tablet by mouth daily., Disp: , Rfl:    Multiple Vitamin (MULTI-VITAMIN) tablet, Take by mouth.,  Disp: , Rfl:    mycophenolate (CELLCEPT) 250 MG capsule, Take 750 mg by mouth 2 (two) times daily., Disp: , Rfl:    Omega-3 Fatty Acids (FISH OIL PO), Take by mouth., Disp: , Rfl:    selenium sulfide (SELSUN) 2.5 % shampoo, Apply topically as directed., Disp: , Rfl:    Sulfacetamide Sodium-Sulfur 8-4 % SUSP, , Disp: , Rfl:    hydrochlorothiazide (HYDRODIURIL) 12.5 MG tablet, Take 1 tablet (12.5 mg total) by mouth daily., Disp: 30 tablet,  Rfl: 1   Objective:     Vitals:   09/23/22 0856  BP: 128/82  Pulse: 69  SpO2: 99%  Weight: 264 lb (119.7 kg)  Height: 6\' 5"  (1.956 m)      Body mass index is 31.31 kg/m.    Physical Exam:    General: awake, alert, and oriented no acute distress, nontoxic Skin: no suspicious lesions or rashes Neuro:sensation intact distally with no deficits, normal muscle tone, no atrophy, strength 5/5 in all tested lower ext groups Psych: normal mood and affect, speech clear   left Hip: No deformity, swelling or wasting ROM Flexion 90, ext 30, IR 45, ER 45 TTP greater troch NTTP over the hip flexors,  gluteal musculature, si joint, lumbar spine Negative log roll with FROM Negative FABER Negative FADIR Negative Piriformis test Negative trendelenberg Gait normal  Electronically signed by:  [name] [facility] 11:32 AM 09/23/22

## 2022-10-04 ENCOUNTER — Ambulatory Visit: Admit: 2022-10-04 | Payer: BC Managed Care – PPO | Admitting: Orthopaedic Surgery

## 2022-10-04 SURGERY — ARTHROPLASTY, KNEE, TOTAL
Anesthesia: Spinal | Site: Knee | Laterality: Right

## 2022-10-17 ENCOUNTER — Ambulatory Visit (INDEPENDENT_AMBULATORY_CARE_PROVIDER_SITE_OTHER): Payer: BC Managed Care – PPO | Admitting: Adult Health

## 2022-10-17 ENCOUNTER — Encounter (INDEPENDENT_AMBULATORY_CARE_PROVIDER_SITE_OTHER): Payer: Self-pay | Admitting: Adult Health

## 2022-10-17 VITALS — BP 145/85 | HR 75 | Temp 97.3°F | Ht 77.0 in | Wt 265.0 lb

## 2022-10-17 DIAGNOSIS — Q613 Polycystic kidney, unspecified: Secondary | ICD-10-CM

## 2022-10-17 DIAGNOSIS — E669 Obesity, unspecified: Secondary | ICD-10-CM

## 2022-10-17 DIAGNOSIS — Z6831 Body mass index (BMI) 31.0-31.9, adult: Secondary | ICD-10-CM

## 2022-10-17 DIAGNOSIS — I10 Essential (primary) hypertension: Secondary | ICD-10-CM | POA: Diagnosis not present

## 2022-10-17 DIAGNOSIS — R34 Anuria and oliguria: Secondary | ICD-10-CM | POA: Diagnosis not present

## 2022-10-17 NOTE — Progress Notes (Signed)
WEIGHT SUMMARY AND BIOMETRICS  Vitals Temp: (!) 97.3 F (36.3 C) BP: (!) 145/85 Pulse Rate: 75 SpO2: 99 %   Anthropometric Measurements Height: 6\' 5"  (1.956 m) Weight: 265 lb (120.2 kg) BMI (Calculated): 31.42 Weight at Last Visit: 257lb Weight Lost Since Last Visit: 0 Weight Gained Since Last Visit: 8lb Starting Weight: 285lb Total Weight Loss (lbs): 20 lb (9.072 kg)   Body Composition  Body Fat %: 30.5 % Fat Mass (lbs): 80.8 lbs Muscle Mass (lbs): 175.4 lbs Total Body Water (lbs): 141.4 lbs Visceral Fat Rating : 17   Other Clinical Data Fasting: no Labs: no Today's Visit #: 74 Starting Date: 05/22/19    Chief Complaint:   OBESITY Seth is here to discuss his progress with his obesity treatment plan. He is on the the Category 3 Plan and states he is following his eating plan approximately 50 % of the time. He states he is exercising Walking daily.   Interim History:  Seth Morales recently drive to/from [place] last week. He endorses increase in bilateral lower extremity edema the last month. He denies dyspnea He has been on max dose Amlodipine 10mg  since 08/25/2022 Bioempedence reflects increase water weight by 8 lbs since last OV at [facility] on 09/14/2022  Stress- he endorses increased stress with Fall 2024  school semesters starting at [employer], [employer], and [employer]- He is Music Professor  Exercise-due to increased fatigue he has been unable to exercise at high intensity.  He is able to walk, but then need to rest. He feels that increased fatigue is r/t recently started CCB for uncontrolled HTN. He postponed R total knee replacement until Spring 2025- to allow for recovery time  Hydration-[facility] recently provided hydration guidelines of "drink until thirst". He reports decreased UOP the last 2-3 weeks.   He was supposed to have labs per Nephrology- labs cancelled due to orders not being placed. He has OV with [facility]  10/19/2022  Subjective:   1. Decreased urine output Hydration-[facility] recently provided hydration guidelines of "drink until thirst". He reports decreased UOP the last 2-3 weeks.   He was supposed to have labs per Nephrology- labs cancelled due to orders not being placed. He has OV with [facility] 10/19/2022  2. Polycystic kidney disease Followed by [facility]  3. Essential hypertension 08/10/2022 started Amlodipine 5mg   08/25/2022 started Amlodipine 10mg , re-started hydrochlorothiazide 12.5mg  (take 25mg  if lower ext edema worsens)   Assessment/Plan:   1. Decreased urine output F/u with Nephrologist this Wed 10/19/2022 Continue to drink to thirst per Nephrology  2. Polycystic kidney disease F/u with Nephrologist this Wed 10/19/2022  3. Essential hypertension F/u with Nephrologist this Wed 10/19/2022 Limit Na+ intake Inquire about GNFAOZ therapy with Nephrology, re: Heart Diease indication  4. Obesity, current BMI 31.42  Seth Morales is currently in the action stage of change. As such, his goal is to continue with weight loss efforts. He has agreed to the Category 3 Plan.   Exercise goals: No exercise has been prescribed at this time.  Behavioral modification strategies: increasing lean protein intake, decreasing simple carbohydrates, increasing vegetables, increasing water intake, no skipping meals, meal planning and cooking strategies, keeping healthy foods in the home, avoiding temptations, and planning for success.  Seth Morales has agreed to follow-up with our clinic in 4 weeks. He was informed of the importance of frequent follow-up visits to maximize his success with intensive lifestyle modifications for his multiple health conditions.   Objective:  Blood pressure (!) 145/85, pulse 75, temperature (!) 97.3 F (36.3 C), height 6\' 5"  (1.956 m), weight 265 lb (120.2 kg), SpO2 99%. Body mass index is 31.42 kg/m.  General: Cooperative, alert, well developed, in no  acute distress. HEENT: Conjunctivae and lids unremarkable. Cardiovascular: Regular rhythm.  Lungs: Normal work of breathing. Neurologic: No focal deficits.   Lab Results  Component Value Date   CREATININE 1.24 07/25/2022   BUN 28 (H) 07/25/2022   NA 137 07/25/2022   K 3.7 07/25/2022   CL 101 07/25/2022   CO2 26 07/25/2022   Lab Results  Component Value Date   ALT 34 06/22/2022   AST 31 06/22/2022   ALKPHOS 72 06/22/2022   BILITOT 0.7 06/22/2022   Lab Results  Component Value Date   HGBA1C 5.7 (H) 06/22/2022   HGBA1C 5.4 09/29/2021   HGBA1C 5.3 05/04/2021   HGBA1C 5.5 06/25/2020   HGBA1C 5.4 05/22/2019   Lab Results  Component Value Date   INSULIN 8.4 06/22/2022   INSULIN 7.1 09/29/2021   INSULIN 12.8 05/04/2021   INSULIN 8.5 06/25/2020   INSULIN 6.0 01/20/2020   Lab Results  Component Value Date   TSH 2.970 05/22/2019   Lab Results  Component Value Date   CHOL 180 06/25/2020   HDL 51 06/25/2020   LDLCALC 109 (H) 06/25/2020   LDLDIRECT 114.0 03/14/2019   TRIG 113 06/25/2020   CHOLHDL 3.4 01/20/2020   Lab Results  Component Value Date   VD25OH 54.0 06/22/2022   VD25OH 67.7 09/29/2021   VD25OH 74.0 05/04/2021   Lab Results  Component Value Date   WBC 8.3 07/25/2022   HGB 13.7 07/25/2022   HCT 41.0 07/25/2022   MCV 89.9 07/25/2022   PLT 199 07/25/2022   No results found for: "IRON", "TIBC", "FERRITIN"  Attestation Statements:   Reviewed by clinician on day of visit: allergies, medications, problem list, medical history, surgical history, family history, social history, and previous encounter notes.  Time spent on visit including pre-visit chart review and post-visit care and charting was 28 minutes.   I have reviewed the above documentation for accuracy and completeness, and I agree with the above. -   d. , NP-C

## 2022-10-27 ENCOUNTER — Encounter: Payer: Self-pay | Admitting: Family Medicine

## 2022-11-23 ENCOUNTER — Ambulatory Visit (INDEPENDENT_AMBULATORY_CARE_PROVIDER_SITE_OTHER): Payer: BC Managed Care – PPO | Admitting: Adult Health

## 2022-11-23 ENCOUNTER — Encounter (INDEPENDENT_AMBULATORY_CARE_PROVIDER_SITE_OTHER): Payer: Self-pay | Admitting: Adult Health

## 2022-11-23 VITALS — BP 145/82 | HR 77 | Temp 97.5°F | Ht 77.0 in | Wt 267.0 lb

## 2022-11-23 DIAGNOSIS — Q613 Polycystic kidney, unspecified: Secondary | ICD-10-CM

## 2022-11-23 DIAGNOSIS — Z6831 Body mass index (BMI) 31.0-31.9, adult: Secondary | ICD-10-CM

## 2022-11-23 DIAGNOSIS — I10 Essential (primary) hypertension: Secondary | ICD-10-CM | POA: Diagnosis not present

## 2022-11-23 DIAGNOSIS — Z6833 Body mass index (BMI) 33.0-33.9, adult: Secondary | ICD-10-CM

## 2022-11-23 DIAGNOSIS — E669 Obesity, unspecified: Secondary | ICD-10-CM | POA: Diagnosis not present

## 2022-11-23 NOTE — Progress Notes (Unsigned)
[name] [address] [place] [address] Phone: [phone] Subjective:   [name], am serving as a scribe for Dr. Antoine Primas.  I'm seeing this patient by the request  of:  Truett Perna, MD  CC: Knee, shoulder, hip  BJY:[id number]  07/27/2022 Chronic problem with worsening symptoms.  Likely secondary to patient having worsening symptoms.  Likely of his knee.  Discussed with patient about which activities to do and which ones to avoid.  Discussed increasing activity slowly.  Follow-up with me again 6 to 8 weeks.     Chronic problem with worsening symptoms.  Needed to over 10 months.  Discussed icing regimen and home exercises, discussed which activities to do and which ones to avoid.  Increase activity slowly over the course of next several weeks.  Follow-up with me again in 6 to 8 weeks otherwise.  Patient may be having surgical intervention at some other point.     Updated 11/24/2022 Seth Morales is a 62 y.o. male coming in with complaint of knee pain. Gel for the knees. R Shoulder still bothering him and hurts down through the arm and hip hurts but not bad.       Past Medical History:  Diagnosis Date   Allergy    Anemia    Back pain    Bilateral swelling of feet    Blood transfusion without reported diagnosis 13 years ago    Chronic kidney disease    Dermatitis    Hypertension    Joint pain    Kidney transplanted    Vitamin D deficiency    Past Surgical History:  Procedure Laterality Date   COLONOSCOPY  09/06/2012   at [place]   EYE SURGERY     INSERTION OF DIALYSIS CATHETER     KIDNEY TRANSPLANT  2006   KNEE ARTHROPLASTY Bilateral    NEPHRECTOMY Bilateral    Social History   Socioeconomic History   Marital status: Married    Spouse name: Not on file   Number of children: Not on file   Years of education: Not on file   Highest education level: Not on file  Occupational History   Not on file  Tobacco  Use   Smoking status: Never   Smokeless tobacco: Never  Vaping Use   Vaping status: Never Used  Substance and Sexual Activity   Alcohol use: Yes    Alcohol/week: 2.0 standard drinks of alcohol    Types: 2 Standard drinks or equivalent per week   Drug use: No   Sexual activity: Not on file  Other Topics Concern   Not on file  Social History Narrative   Not on file   Social Determinants of Health   Financial Resource Strain: Not on file  Food Insecurity: Low Risk  (09/16/2022)   Received from [facility]   Hunger Vital Sign    Worried About Running Out of Food in the Last Year: Never true    Ran Out of Food in the Last Year: Never true  Transportation Needs: Not on file (09/16/2022)  Physical Activity: Not on file  Stress: Not on file  Social Connections: Not on file   No Known Allergies Family History  Problem Relation Age of Onset   Arthritis Mother    Hypertension Mother    Kidney disease Mother    Diabetes Mother    Diabetes Father    Colon cancer Neg Hx    Colon polyps Neg  Hx    Esophageal cancer Neg Hx    Rectal cancer Neg Hx    Stomach cancer Neg Hx    Pancreatic cancer Neg Hx      Current Outpatient Medications (Cardiovascular):    amLODipine (NORVASC) 5 MG tablet,    hydrochlorothiazide (HYDRODIURIL) 25 MG tablet,    Current Outpatient Medications (Analgesics):    allopurinol (ZYLOPRIM) 100 MG tablet, Take 100 mg by mouth daily.   Current Outpatient Medications (Other):    Cholecalciferol (VITAMIN D) 125 MCG (5000 UT) CAPS, Take 5,000 Units by mouth daily.    cycloSPORINE modified (NEORAL) 100 MG capsule, Take 100 mg by mouth 2 (two) times daily.   cycloSPORINE modified (NEORAL) 25 MG capsule,    doxycycline (MONODOX) 100 MG capsule, Take 100 mg by mouth 2 (two) times daily.   magnesium oxide-pyridoxine (BEELITH) 362-20 MG TABS, Take 1 tablet by mouth daily.   Multiple Vitamin (MULTI-VITAMIN) tablet, Take by mouth.   mycophenolate (CELLCEPT) 250 MG  capsule, Take 750 mg by mouth 2 (two) times daily.   Omega-3 Fatty Acids (FISH OIL PO), Take by mouth.   selenium sulfide (SELSUN) 2.5 % shampoo, Apply topically as directed.   Sulfacetamide Sodium-Sulfur 8-4 % SUSP,    Reviewed prior external information including notes and imaging from  primary care provider As well as notes that were available from care everywhere and other healthcare systems.  Past medical history, social, surgical and family history all reviewed in electronic medical record.  No pertanent information unless stated regarding to the chief complaint.   Review of Systems:  No headache, visual changes, nausea, vomiting, diarrhea, constipation, dizziness, abdominal pain, skin rash, fevers, chills, night sweats, weight loss, swollen lymph nodes, body aches, joint swelling, chest pain, shortness of breath, mood changes. POSITIVE muscle aches  Objective  Blood pressure 128/74, pulse 93, height 6\' 5"  (1.956 m), weight 267 lb (121.1 kg), SpO2 97%.   General: No apparent distress alert and oriented x3 mood and affect normal, dressed appropriately.  HEENT: Pupils equal, extraocular movements intact  Respiratory: Patient's speak in full sentences and does not appear short of breath  .  Bilateral shoulders do show patient does have impingement noted.  Seems to be the shoulder themselves with more of tendinitis type symptoms.  No weakness noted.  Left hip does have some tenderness to palpation over the greater trochanteric area.  Right knee does have effusion noted.  Significant arthritic changes.  Instability with valgus and varus force  After informed written and verbal consent, patient was seated on exam table. Right knee was prepped with alcohol swab and utilizing anterolateral approach, patient's right knee space was injected with 48 mg per 3 mL of Synvisc 1 (sodium hyaluronate) in a prefilled syringe was injected easily into the knee through a 22-gauge needle..Patient tolerated  the procedure well without immediate complications.  After informed written and verbal consent, patient was seated on exam table. Right shoulder was prepped with alcohol swab and utilizing posterior approach, patient's right glenohumeral space was injected with 4:1  marcaine 0.5%: Kenalog 40mg /dL. Patient tolerated the procedure well without immediate complications.  After informed written and verbal consent, patient was seated on exam table. Left shoulder was prepped with alcohol swab and utilizing posterior approach, patient's right glenohumeral space was injected with 4:1  marcaine 0.5%: Kenalog 40mg /dL. Patient tolerated the procedure well without immediate complications.   After verbal consent patient was prepped with alcohol swab and with a 21-gauge 2 inch needle injected into  the left greater trochanteric area with 2 cc of 0.5% Marcaine and 1 cc of Kenalog 40 mg/mL.  No blood loss.  Band-Aid placed.  Postinjection instructions given    Impression and Recommendations:    The above documentation has been reviewed and is accurate and complete Judi Saa, DO

## 2022-11-23 NOTE — Progress Notes (Signed)
WEIGHT SUMMARY AND BIOMETRICS  Vitals Temp: (!) 97.5 F (36.4 C) BP: (!) 145/82 Pulse Rate: 77 SpO2: 97 %   Anthropometric Measurements Height: 6\' 5"  (1.956 m) Weight: 267 lb (121.1 kg) BMI (Calculated): 31.65 Weight at Last Visit: 265lb Weight Lost Since Last Visit: 0 Weight Gained Since Last Visit: 2lb Starting Weight: 285lb Total Weight Loss (lbs): 18 lb (8.165 kg)   Body Composition  Body Fat %: 33.8 % Fat Mass (lbs): 90.2 lbs Muscle Mass (lbs): 168.2 lbs Total Body Water (lbs): 142 lbs Visceral Fat Rating : 18   Other Clinical Data Fasting: no Labs: no Today's Visit #: 80 Starting Date: 05/22/19    Chief Complaint:   OBESITY Seth Morales is here to discuss his progress with his obesity treatment plan. He is on the the Category 3 Plan and states he is following his eating plan approximately 60 % of the time. He states he is exercising Brisk Walking 30 minutes 2-3 times per week.   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  He is anticipating R Total knee replacement late Spring 2025.  He had regular f/u with Nephrologist 10/19/2022- antihypertensives reduced. He reports profound fatigue when on max dose Amlodipine 10mg . Since CCB was halved- he reports improved energy levels.  Subjective:   1. Essential hypertension BP stable Home readings  SBP 130-140s DBP 70-80s 10/19/2022 Nephrologist adjusted antihypertensive therapy- Amlodipine decreased from 10mg  to 5mg  Hydrochlorothiazide increased from 12.5mg  to 25mg    He denies acute cardiac sx's and improved energy levels since reduced of CCB dose  2. Polycystic kidney disease 10/19/2022 Nephrologist adjusted antihypertensive therapy- Amlodipine decreased from 10mg  to 5mg  Hydrochlorothiazide increased from 12.5mg  to 25mg    He denies acute cardiac sx's and improved energy levels since reduced of CCB dose  Assessment/Plan:   1.  Essential hypertension Remain well hydrated Continue current antihypertensive regime  2. Polycystic kidney disease Establish with new Nephrologist  3. Obesity, current BMI 31.65  Seth Morales is currently in the action stage of change. As such, his goal is to continue with weight loss efforts. He has agreed to the Category 3 Plan.   Exercise goals: All adults should avoid inactivity. Some physical activity is better than none, and adults who participate in any amount of physical activity gain some health benefits. Adults should also include muscle-strengthening activities that involve all major muscle groups on 2 or more days a week.  Behavioral modification strategies: increasing lean protein intake, decreasing simple carbohydrates, increasing vegetables, increasing water intake, decreasing eating out, no skipping meals, meal planning and cooking strategies, and planning for success.  Seth Morales has agreed to follow-up with our clinic in 4 weeks. He was informed of the importance of frequent follow-up visits to maximize his success with intensive lifestyle modifications for his multiple health conditions.   Objective:   Blood pressure (!) 145/82, pulse 77, temperature (!) 97.5 F (36.4 C), height 6\' 5"  (1.956 m), weight 267 lb (121.1 kg), SpO2 97%. Body mass index is 31.66 kg/m.  General: Cooperative, alert, well developed, in no acute distress. HEENT: Conjunctivae and lids unremarkable. Cardiovascular: Regular rhythm.  Lungs: Normal work of breathing. Neurologic: No focal deficits.   Lab Results  Component Value Date   CREATININE 1.24 07/25/2022   BUN 28 (H) 07/25/2022   NA 137 07/25/2022   K 3.7 07/25/2022   CL 101 07/25/2022   CO2 26 07/25/2022   Lab Results  Component Value Date   ALT 34 06/22/2022   AST 31 06/22/2022   ALKPHOS 72 06/22/2022   BILITOT 0.7 06/22/2022   Lab Results  Component Value Date   HGBA1C 5.7 (H) 06/22/2022   HGBA1C 5.4 09/29/2021   HGBA1C 5.3  05/04/2021   HGBA1C 5.5 06/25/2020   HGBA1C 5.4 05/22/2019   Lab Results  Component Value Date   INSULIN 8.4 06/22/2022   INSULIN 7.1 09/29/2021   INSULIN 12.8 05/04/2021   INSULIN 8.5 06/25/2020   INSULIN 6.0 01/20/2020   Lab Results  Component Value Date   TSH 2.970 05/22/2019   Lab Results  Component Value Date   CHOL 180 06/25/2020   HDL 51 06/25/2020   LDLCALC 109 (H) 06/25/2020   LDLDIRECT 114.0 03/14/2019   TRIG 113 06/25/2020   CHOLHDL 3.4 01/20/2020   Lab Results  Component Value Date   VD25OH 54.0 06/22/2022   VD25OH 67.7 09/29/2021   VD25OH 74.0 05/04/2021   Lab Results  Component Value Date   WBC 8.3 07/25/2022   HGB 13.7 07/25/2022   HCT 41.0 07/25/2022   MCV 89.9 07/25/2022   PLT 199 07/25/2022   No results found for: "IRON", "TIBC", "FERRITIN"  Attestation Statements:   Reviewed by clinician on day of visit: allergies, medications, problem list, medical history, surgical history, family history, social history, and previous encounter notes.  Time spent on visit including pre-visit chart review and post-visit care and charting was 29 minutes.   I have reviewed the above documentation for accuracy and completeness, and I agree with the above. -  Kellyanne Ellwanger d. Seth Leard, NP-C

## 2022-11-24 ENCOUNTER — Ambulatory Visit: Payer: BC Managed Care – PPO | Admitting: Family Medicine

## 2022-11-24 ENCOUNTER — Encounter: Payer: Self-pay | Admitting: Family Medicine

## 2022-11-24 VITALS — BP 128/74 | HR 93 | Ht 77.0 in | Wt 267.0 lb

## 2022-11-24 DIAGNOSIS — M7552 Bursitis of left shoulder: Secondary | ICD-10-CM | POA: Insufficient documentation

## 2022-11-24 DIAGNOSIS — M7062 Trochanteric bursitis, left hip: Secondary | ICD-10-CM

## 2022-11-24 DIAGNOSIS — M1711 Unilateral primary osteoarthritis, right knee: Secondary | ICD-10-CM

## 2022-11-24 DIAGNOSIS — M7551 Bursitis of right shoulder: Secondary | ICD-10-CM

## 2022-11-24 MED ORDER — HYLAN G-F 20 16 MG/2ML IX SOSY
16.0000 mg | PREFILLED_SYRINGE | Freq: Once | INTRA_ARTICULAR | Status: AC
Start: 2022-11-24 — End: 2022-11-24
  Administered 2022-11-24: 16 mg via INTRA_ARTICULAR

## 2022-11-24 NOTE — Assessment & Plan Note (Signed)
Patient given injection and tolerated the procedure well, discussed icing regimen and home exercises.  Discussed which activities to do and which ones to avoid.  Patient does need a knee replacement but is going to hold off until about May of next year.

## 2022-11-24 NOTE — Assessment & Plan Note (Signed)
Patient given injection and tolerated the procedure well, discussed icing regimen and home exercises, which activities to do and which ones to avoid.  Follow-up again in 6 to 8 weeks

## 2022-11-24 NOTE — Patient Instructions (Addendum)
Gel injection in R knee today Injection in shoulders and hip today See you again in 2-3 months

## 2022-11-24 NOTE — Assessment & Plan Note (Signed)
Bilateral injections given today and tolerated the procedure well, discussed icing regimen and home exercises.  Patient will start doing home exercises and we will see how patient responds.  Follow-up with me again in 6 to 8 weeks otherwise.

## 2022-12-28 ENCOUNTER — Ambulatory Visit (INDEPENDENT_AMBULATORY_CARE_PROVIDER_SITE_OTHER): Payer: BC Managed Care – PPO | Admitting: Adult Health

## 2022-12-28 ENCOUNTER — Encounter (INDEPENDENT_AMBULATORY_CARE_PROVIDER_SITE_OTHER): Payer: Self-pay | Admitting: Adult Health

## 2022-12-28 VITALS — BP 149/81 | HR 85 | Temp 97.7°F | Ht 77.0 in | Wt 262.0 lb

## 2022-12-28 DIAGNOSIS — I10 Essential (primary) hypertension: Secondary | ICD-10-CM | POA: Diagnosis not present

## 2022-12-28 DIAGNOSIS — E559 Vitamin D deficiency, unspecified: Secondary | ICD-10-CM

## 2022-12-28 DIAGNOSIS — Z6831 Body mass index (BMI) 31.0-31.9, adult: Secondary | ICD-10-CM | POA: Diagnosis not present

## 2022-12-28 DIAGNOSIS — Z6833 Body mass index (BMI) 33.0-33.9, adult: Secondary | ICD-10-CM

## 2022-12-28 DIAGNOSIS — E669 Obesity, unspecified: Secondary | ICD-10-CM

## 2022-12-28 NOTE — Progress Notes (Signed)
WEIGHT SUMMARY AND BIOMETRICS  Vitals Temp: 97.7 F (36.5 C) BP: (!) 149/81 Pulse Rate: 85 SpO2: 98 %   Anthropometric Measurements Height: 6\' 5"  (1.956 m) Weight: 262 lb (118.8 kg) BMI (Calculated): 31.06 Weight at Last Visit: 267lb Weight Lost Since Last Visit: 5lb Weight Gained Since Last Visit: 0 Starting Weight: 285lb Total Weight Loss (lbs): 23 lb (10.4 kg)   Body Composition  Body Fat %: 34.6 % Fat Mass (lbs): 91 lbs Muscle Mass (lbs): 163.4 lbs Total Body Water (lbs): 131.6 lbs Visceral Fat Rating : 18   Other Clinical Data Fasting: no Labs: no Today's Visit #: 40 Starting Date: 05/22/19    Chief Complaint:   OBESITY Seth Morales is here to discuss his progress with his obesity treatment plan. He is on the the Category 3 Plan and states he is following his eating plan approximately 50 % of the time. He states he is not currently exercising.   Interim History:  He left [facility] after 15 years of care. He will establish with [facility] on 02/22/23  He is currently on daily Amlodipine 5mg  and hydrochlorothiazide 25mg  When he increased Amlodipine 10mg  he experience fatigue and significant bilateral lower extremity edema.  Last OV with PCP/Dr. Regino Schultze summer 2024  Subjective:   1. Vitamin D deficiency  Latest Reference Range & Units 09/29/21 09:52 06/22/22 08:07  Vitamin D, 25-Hydroxy 30.0 - 100.0 ng/mL 67.7 54.0   He reports stable energy, even despite his hectic schedule He is on daily OTC Vit D 3 5,000 international units   2. Essential hypertension He has not been checking home BP He has been travelling frequently He reports dull HA today, denies CP He is on He is currently on daily Amlodipine 5mg  and hydrochlorothiazide 25mg  When he increased Amlodipine 10mg  he experience fatigue and significant bilateral lower extremity edema.  BP elevated at OV He reports consuming a high Na+ meal last night  Assessment/Plan:    1. Vitamin D deficiency Continue daily OTC Vit D 3 5,000 international units   2. Essential hypertension Continue daily Amlodipine 5mg  and hydrochlorothiazide 25mg  LIMIT NA+ intake  3. Obesity, current BMI 31.06  Seth Morales is currently in the action stage of change. As such, his goal is to continue with weight loss efforts. He has agreed to the Category 3 Plan.   Exercise goals: For substantial health benefits, adults should do at least 150 minutes (2 hours and 30 minutes) a week of moderate-intensity, or 75 minutes (1 hour and 15 minutes) a week of vigorous-intensity aerobic physical activity, or an equivalent combination of moderate- and vigorous-intensity aerobic activity. Aerobic activity should be performed in episodes of at least 10 minutes, and preferably, it should be spread throughout the week.  Behavioral modification strategies: increasing lean protein intake, decreasing simple carbohydrates, increasing vegetables, increasing water intake, decreasing sodium intake, no skipping meals, meal planning and cooking strategies, keeping healthy foods in the home, and planning for success.  Seth Morales has agreed to follow-up with our clinic in 4 weeks. He was informed of the importance of frequent follow-up visits to maximize his success with intensive lifestyle modifications for his multiple health conditions.   Check Fasting Labs at next OV  Objective:   Blood pressure (!) 149/81, pulse 85, temperature 97.7 F (36.5 C), height 6\' 5"  (1.956 m), weight 262 lb (118.8 kg), SpO2 98%. Body mass index is 31.07 kg/m.  General: Cooperative, alert, well developed, in no acute distress. HEENT: Conjunctivae and  lids unremarkable. Cardiovascular: Regular rhythm.  Lungs: Normal work of breathing. Neurologic: No focal deficits.   Lab Results  Component Value Date   CREATININE 1.24 07/25/2022   BUN 28 (H) 07/25/2022   NA 137 07/25/2022   K 3.7 07/25/2022   CL 101 07/25/2022   CO2 26  07/25/2022   Lab Results  Component Value Date   ALT 34 06/22/2022   AST 31 06/22/2022   ALKPHOS 72 06/22/2022   BILITOT 0.7 06/22/2022   Lab Results  Component Value Date   HGBA1C 5.7 (H) 06/22/2022   HGBA1C 5.4 09/29/2021   HGBA1C 5.3 05/04/2021   HGBA1C 5.5 06/25/2020   HGBA1C 5.4 05/22/2019   Lab Results  Component Value Date   INSULIN 8.4 06/22/2022   INSULIN 7.1 09/29/2021   INSULIN 12.8 05/04/2021   INSULIN 8.5 06/25/2020   INSULIN 6.0 01/20/2020   Lab Results  Component Value Date   TSH 2.970 05/22/2019   Lab Results  Component Value Date   CHOL 180 06/25/2020   HDL 51 06/25/2020   LDLCALC 109 (H) 06/25/2020   LDLDIRECT 114.0 03/14/2019   TRIG 113 06/25/2020   CHOLHDL 3.4 01/20/2020   Lab Results  Component Value Date   VD25OH 54.0 06/22/2022   VD25OH 67.7 09/29/2021   VD25OH 74.0 05/04/2021   Lab Results  Component Value Date   WBC 8.3 07/25/2022   HGB 13.7 07/25/2022   HCT 41.0 07/25/2022   MCV 89.9 07/25/2022   PLT 199 07/25/2022   No results found for: "IRON", "TIBC", "FERRITIN"  Attestation Statements:   Reviewed by clinician on day of visit: allergies, medications, problem list, medical history, surgical history, family history, social history, and previous encounter notes.  Time spent on visit including pre-visit chart review and post-visit care and charting was 27 minutes.   I have reviewed the above documentation for accuracy and completeness, and I agree with the above. -  Elwanda Moger d. Aneyah Lortz, NP-C

## 2023-01-08 ENCOUNTER — Encounter: Payer: Self-pay | Admitting: Family Medicine

## 2023-01-25 ENCOUNTER — Ambulatory Visit (INDEPENDENT_AMBULATORY_CARE_PROVIDER_SITE_OTHER): Payer: BC Managed Care – PPO | Admitting: Adult Health

## 2023-01-25 ENCOUNTER — Encounter (INDEPENDENT_AMBULATORY_CARE_PROVIDER_SITE_OTHER): Payer: Self-pay | Admitting: Adult Health

## 2023-01-25 VITALS — BP 137/81 | HR 80 | Temp 97.5°F | Ht 77.0 in | Wt 263.0 lb

## 2023-01-25 DIAGNOSIS — Z6831 Body mass index (BMI) 31.0-31.9, adult: Secondary | ICD-10-CM

## 2023-01-25 DIAGNOSIS — E66811 Obesity, class 1: Secondary | ICD-10-CM

## 2023-01-25 DIAGNOSIS — Q613 Polycystic kidney, unspecified: Secondary | ICD-10-CM | POA: Diagnosis not present

## 2023-01-25 DIAGNOSIS — E88819 Insulin resistance, unspecified: Secondary | ICD-10-CM | POA: Diagnosis not present

## 2023-01-25 DIAGNOSIS — E669 Obesity, unspecified: Secondary | ICD-10-CM

## 2023-01-25 DIAGNOSIS — Z6833 Body mass index (BMI) 33.0-33.9, adult: Secondary | ICD-10-CM

## 2023-01-25 NOTE — Progress Notes (Signed)
WEIGHT SUMMARY AND BIOMETRICS  Vitals Temp: (!) 97.5 F (36.4 C) BP: 137/81 Pulse Rate: 80 SpO2: 98 %   Anthropometric Measurements Height: 6\' 5"  (1.956 m) Weight: 263 lb (119.3 kg) BMI (Calculated): 31.18 Weight at Last Visit: 262lb Weight Lost Since Last Visit: 0 Weight Gained Since Last Visit: 1lb Starting Weight: 285lb Total Weight Loss (lbs): 22 lb (9.979 kg)   Body Composition  Body Fat %: 34 % Fat Mass (lbs): 89.6 lbs Muscle Mass (lbs): 165.6 lbs Total Body Water (lbs): 135.2 lbs Visceral Fat Rating : 18   Other Clinical Data Fasting: no Labs: no Today's Visit #: 41 Starting Date: 05/22/19    Chief Complaint:   OBESITY Seth Morales is here to discuss his progress with his obesity treatment plan. He is on the the Category 3 Plan and states he is following his eating plan approximately 75 % of the time. He states he is exercising [employer] Activities and Trump[employer] Playing.   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 Polycystic kidney disease He will establish with new Nephrologist- [facility] on 12/18//24 He is on cycloSPORINE modified (NEORAL) therapy  2. Insulin resistance  Latest Reference Range & Units 05/04/21 11:18 09/29/21 09:52 06/22/22 08:07  INSULIN 2.6 - 24.9 uIU/mL 12.8 7.1 8.4   Last dose of Mounjaro 7.5 mg 08/20/2021  GLP-1/GIP therapy stopped due to symptomatic hypoglycemia  Assessment/Plan:   1. Polycystic kidney disease Continue current medication regime Establish with [facility] mid Dec  2.  Insulin resistance Limit sugar/simple CHO Increase daily activity  3. Obesity, current BMI 31.18  Seth Morales is currently in the action stage of change. As such, his goal is to continue with weight loss efforts. He has agreed to the Category 3 Plan.   Exercise goals: For substantial health benefits, adults should do at least 150 minutes (2 hours and 30 minutes) a week of moderate-intensity, or 75 minutes (1 hour and 15 minutes) a week of vigorous-intensity aerobic physical activity, or an equivalent combination of moderate- and vigorous-intensity aerobic activity. Aerobic activity should be performed in episodes of at least 10 minutes, and preferably, it should be spread throughout the week.  Behavioral modification strategies: increasing lean protein intake, decreasing simple carbohydrates, increasing vegetables, increasing water intake, decreasing eating out, no skipping meals, meal planning and cooking strategies, keeping healthy foods in the home, holiday eating strategies , and planning for success.  Seth Morales has agreed to follow-up with our clinic in 4 weeks. He was informed of the importance of frequent follow-up visits to maximize his success with intensive lifestyle modifications for his multiple health conditions.   Check Fasting Labs at next OV  Objective:   Blood pressure 137/81, pulse 80, temperature (!) 97.5 F (36.4 C), height 6\' 5"  (1.956 m), weight 263 lb (119.3 kg), SpO2 98%. Body mass index is 31.19 kg/m.  General: Cooperative, alert, well developed, in no acute distress. HEENT: Conjunctivae and lids unremarkable. Cardiovascular: Regular rhythm.  Lungs: Normal work of breathing. Neurologic: No focal deficits.   Lab Results  Component Value Date   CREATININE 1.24 07/25/2022   BUN 28 (H) 07/25/2022   NA 137 07/25/2022   K 3.7 07/25/2022   CL 101 07/25/2022   CO2 26 07/25/2022   Lab Results  Component Value Date   ALT 34 06/22/2022   AST 31 06/22/2022   ALKPHOS  72 06/22/2022   BILITOT 0.7 06/22/2022   Lab Results  Component Value Date   HGBA1C 5.7 (H) 06/22/2022   HGBA1C 5.4 09/29/2021   HGBA1C 5.3 05/04/2021   HGBA1C 5.5 06/25/2020   HGBA1C 5.4 05/22/2019   Lab Results  Component Value Date   INSULIN 8.4 06/22/2022   INSULIN 7.1 09/29/2021   INSULIN 12.8 05/04/2021   INSULIN 8.5 06/25/2020   INSULIN 6.0 01/20/2020   Lab Results  Component Value Date   TSH 2.970 05/22/2019   Lab Results  Component Value Date   CHOL 180 06/25/2020   HDL 51 06/25/2020   LDLCALC 109 (H) 06/25/2020   LDLDIRECT 114.0 03/14/2019   TRIG 113 06/25/2020   CHOLHDL 3.4 01/20/2020   Lab Results  Component Value Date   VD25OH 54.0 06/22/2022   VD25OH 67.7 09/29/2021   VD25OH 74.0 05/04/2021   Lab Results  Component Value Date   WBC 8.3 07/25/2022   HGB 13.7 07/25/2022   HCT 41.0 07/25/2022   MCV 89.9 07/25/2022   PLT 199 07/25/2022   No results found for: "IRON", "TIBC", "FERRITIN"  Attestation Statements:   Reviewed by clinician on day of visit: allergies, medications, problem list, medical history, surgical history, family history, social history, and previous encounter notes.  Time spent on visit including pre-visit chart review and post-visit care and charting was 25 minutes.   I have reviewed the above documentation for accuracy and completeness, and I agree with the above. -  Lubna Stegeman d. Edouard Gikas, NP-C

## 2023-02-28 ENCOUNTER — Ambulatory Visit (INDEPENDENT_AMBULATORY_CARE_PROVIDER_SITE_OTHER): Payer: BC Managed Care – PPO | Admitting: Adult Health

## 2023-03-09 NOTE — Progress Notes (Signed)
 [name] [address] [place] [address] Phone: [phone] Subjective:   [name], am serving as a scribe for Dr. Arthea Claudene.  I'm seeing this patient by the request  of:  Cesario Mutton, MD  CC: Bilateral hip and bilateral knee pain  YEP:Dlagzrupcz  11/24/2022 Patient given injection and tolerated the procedure well, discussed icing regimen and home exercises, which activities to do and which ones to avoid.  Follow-up again in 6 to 8 weeks.    Bilateral injections given today and tolerated the procedure well, discussed icing regimen and home exercises. Patient will start doing home exercises and we will see how patient responds. Follow-up with me again in 6 to 8 weeks otherwise.  Patient given injection and tolerated the procedure well, discussed icing regimen and home exercises.  Discussed which activities to do and which ones to avoid.  Patient does need a knee replacement but is going to hold off until about May of next year.     Updated 03/15/2023 ERNEST POPOWSKI is a 63 y.o. male coming in with complaint of shoulder, hip, knee pain. Shoulder pain has been manageable. B hip GT bursitis, L>R. B knee pain, R>L. Considering knee replacement Summer 2025.   Seeing new nephrologist as he is retaining fluid.        Past Medical History:  Diagnosis Date   Allergy    Anemia    Back pain    Bilateral swelling of feet    Blood transfusion without reported diagnosis 13 years ago    Chronic kidney disease    Dermatitis    Hypertension    Joint pain    Kidney transplanted    Vitamin D  deficiency    Past Surgical History:  Procedure Laterality Date   COLONOSCOPY  09/06/2012   at [place]   EYE SURGERY     INSERTION OF DIALYSIS CATHETER     KIDNEY TRANSPLANT  2006   KNEE ARTHROPLASTY Bilateral    NEPHRECTOMY Bilateral    Social History   Socioeconomic History   Marital status: Married    Spouse name: Not on file   Number  of children: Not on file   Years of education: Not on file   Highest education level: Not on file  Occupational History   Not on file  Tobacco Use   Smoking status: Never   Smokeless tobacco: Never  Vaping Use   Vaping status: Never Used  Substance and Sexual Activity   Alcohol use: Yes    Alcohol/week: 2.0 standard drinks of alcohol    Types: 2 Standard drinks or equivalent per week   Drug use: No   Sexual activity: Not on file  Other Topics Concern   Not on file  Social History Narrative   Not on file   Social Drivers of Health   Financial Resource Strain: Not on file  Food Insecurity: Low Risk  (03/09/2023)   Received from [facility]   Hunger Vital Sign    Worried About Running Out of Food in the Last Year: Never true    Ran Out of Food in the Last Year: Never true  Transportation Needs: No Transportation Needs (03/09/2023)   Received from [facility]    In the past 12 months, has lack of reliable transportation kept you from medical appointments, meetings, work or from getting things needed for daily living? : No  Physical Activity: Not on file  Stress: Not  on file  Social Connections: Not on file   No Known Allergies Family History  Problem Relation Age of Onset   Arthritis Mother    Hypertension Mother    Kidney disease Mother    Diabetes Mother    Diabetes Father    Colon cancer Neg Hx    Colon polyps Neg Hx    Esophageal cancer Neg Hx    Rectal cancer Neg Hx    Stomach cancer Neg Hx    Pancreatic cancer Neg Hx      Current Outpatient Medications (Cardiovascular):    amLODipine (NORVASC) 5 MG tablet,    hydrochlorothiazide  (HYDRODIURIL ) 25 MG tablet,    Current Outpatient Medications (Analgesics):    allopurinol  (ZYLOPRIM ) 100 MG tablet, Take 100 mg by mouth daily.   Current Outpatient Medications (Other):    Cholecalciferol (VITAMIN D ) 125 MCG (5000 UT) CAPS, Take 5,000 Units by mouth daily.    cycloSPORINE  modified (NEORAL )  100 MG capsule, Take 100 mg by mouth 2 (two) times daily.   cycloSPORINE  modified (NEORAL ) 25 MG capsule,    doxycycline  (MONODOX ) 100 MG capsule, Take 100 mg by mouth 2 (two) times daily.   magnesium  oxide-pyridoxine  (BEELITH) 362-20 MG TABS, Take 1 tablet by mouth daily.   Multiple Vitamin (MULTI-VITAMIN) tablet, Take by mouth.   mycophenolate  (CELLCEPT ) 250 MG capsule, Take 750 mg by mouth 2 (two) times daily.   Omega-3 Fatty Acids (FISH OIL PO), Take by mouth.   selenium sulfide (SELSUN) 2.5 % shampoo, Apply topically as directed.   Sulfacetamide Sodium-Sulfur 8-4 % SUSP,    Reviewed prior external information including notes and imaging from  primary care provider As well as notes that were available from care everywhere and other healthcare systems.  Past medical history, social, surgical and family history all reviewed in electronic medical record.  No pertanent information unless stated regarding to the chief complaint.   Review of Systems:  No headache, visual changes, nausea, vomiting, diarrhea, constipation, dizziness, abdominal pain, skin rash, fevers, chills, night sweats, weight loss, swollen lymph nodes, body aches, joint swelling, chest pain, shortness of breath, mood changes. POSITIVE muscle aches  Objective  Blood pressure 110/72, pulse 82, height 6' 5 (1.956 m), weight 274 lb (124.3 kg), SpO2 97%.   General: No apparent distress alert and oriented x3 mood and affect normal, dressed appropriately.  HEENT: Pupils equal, extraocular movements intact  Respiratory: Patient's speak in full sentences and does not appear short of breath  Cardiovascular: No lower extremity edema, non tender, no erythema  Severely antalgic gait noted. Tender to palpation over the knees bilaterally.  Does have an effusion noted of the right knee.  More severe tenderness over the right knee. Tender over the greater trochanteric area bilaterally.  After informed written and verbal consent,  patient was seated on exam table. Right knee was prepped with alcohol swab and utilizing anterolateral approach, patient's right knee space was injected with 4:1  marcaine  0.5%: Kenalog  40mg /dL. Patient tolerated the procedure well without immediate complications.   After verbal consent patient was prepped with alcohol swab and with a 21-gauge 2 inch needle injected into the right greater trochanteric area with 2 cc of 0.5% Marcaine  and 1 cc of Kenalog  40 mg/mL.  No blood loss.  Band-Aid placed.  Postinjection instructions given  After informed written and verbal consent, patient was seated on exam table. Left knee was prepped with alcohol swab and utilizing anterolateral approach, patient's left knee space was injected with 4:1  marcaine  0.5%: Kenalog  40mg /dL. Patient tolerated the procedure well without immediate complications.   After verbal consent patient was prepped with alcohol swab and with a 21-gauge 2 inch needle injected into the left greater trochanteric area with 2 cc of 0.5% Marcaine  and 1 cc of Kenalog  40 mg/mL.  No blood loss.  Band-Aid placed.  Postinjection instructions given     Impression and Recommendations:    The above documentation has been reviewed and is accurate and complete Jaselynn Tamas M Alexiya Franqui, DO

## 2023-03-15 ENCOUNTER — Encounter: Payer: Self-pay | Admitting: Family Medicine

## 2023-03-15 ENCOUNTER — Ambulatory Visit: Payer: 59 | Admitting: Family Medicine

## 2023-03-15 VITALS — BP 110/72 | HR 82 | Ht 77.0 in | Wt 274.0 lb

## 2023-03-15 DIAGNOSIS — M170 Bilateral primary osteoarthritis of knee: Secondary | ICD-10-CM

## 2023-03-15 DIAGNOSIS — M7062 Trochanteric bursitis, left hip: Secondary | ICD-10-CM

## 2023-03-15 NOTE — Assessment & Plan Note (Signed)
 Arthritic changes of both knees.  Given injections today.  Tolerated the procedure well, discussed icing regimen and home exercises, increase activity slowly.  Patient will consider the possibility of replacement of the right knee in the near future if needed.  Follow-up again in 6 to 8 weeks.

## 2023-03-15 NOTE — Patient Instructions (Signed)
 Injected B knees and B hips See me again in 2 months

## 2023-03-15 NOTE — Assessment & Plan Note (Signed)
 Chronic problem with exacerbation.  Discussed icing regimen and home exercises, discussed which activities to do and which ones to avoid.  Increase activity slowly otherwise.  Follow-up again in 2 to 3 months

## 2023-03-30 ENCOUNTER — Ambulatory Visit (INDEPENDENT_AMBULATORY_CARE_PROVIDER_SITE_OTHER): Payer: 59 | Admitting: Adult Health

## 2023-04-13 ENCOUNTER — Encounter (INDEPENDENT_AMBULATORY_CARE_PROVIDER_SITE_OTHER): Payer: Self-pay | Admitting: Adult Health

## 2023-04-13 ENCOUNTER — Ambulatory Visit (INDEPENDENT_AMBULATORY_CARE_PROVIDER_SITE_OTHER): Payer: 59 | Admitting: Adult Health

## 2023-04-13 VITALS — BP 135/76 | HR 72 | Temp 98.0°F | Ht 77.0 in | Wt 263.0 lb

## 2023-04-13 DIAGNOSIS — Z0000 Encounter for general adult medical examination without abnormal findings: Secondary | ICD-10-CM

## 2023-04-13 DIAGNOSIS — Z6833 Body mass index (BMI) 33.0-33.9, adult: Secondary | ICD-10-CM

## 2023-04-13 DIAGNOSIS — I10 Essential (primary) hypertension: Secondary | ICD-10-CM | POA: Diagnosis not present

## 2023-04-13 DIAGNOSIS — Q613 Polycystic kidney, unspecified: Secondary | ICD-10-CM

## 2023-04-13 DIAGNOSIS — E559 Vitamin D deficiency, unspecified: Secondary | ICD-10-CM

## 2023-04-13 DIAGNOSIS — Z6831 Body mass index (BMI) 31.0-31.9, adult: Secondary | ICD-10-CM

## 2023-04-13 DIAGNOSIS — E88819 Insulin resistance, unspecified: Secondary | ICD-10-CM | POA: Diagnosis not present

## 2023-04-13 DIAGNOSIS — E669 Obesity, unspecified: Secondary | ICD-10-CM

## 2023-04-13 NOTE — Progress Notes (Signed)
 WEIGHT SUMMARY AND BIOMETRICS  Vitals Temp: 98 F (36.7 C) BP: 135/76 Pulse Rate: 72 SpO2: 96 %   Anthropometric Measurements Height: 6' 5 (1.956 m) Weight: 263 lb (119.3 kg) BMI (Calculated): 31.18 Weight at Last Visit: 263 lb Weight Lost Since Last Visit: 0 Weight Gained Since Last Visit: 0 Starting Weight: 285 lb Total Weight Loss (lbs): 22 lb (9.979 kg)   Body Composition  Body Fat %: 35.5 % Fat Mass (lbs): 93.6 lbs Muscle Mass (lbs): 161.4 lbs Total Body Water (lbs): 128 lbs Visceral Fat Rating : 19   Other Clinical Data Fasting: no Labs: no Today's Visit #: 42 Starting Date: 05/22/19    Chief Complaint:   OBESITY Seth Morales is here to discuss his progress with his obesity treatment plan. He is on the the Category 3 Plan and states he is following his eating plan approximately 50-75 % of the time.  He states he is exercising: NEAT Activities   Interim History:  End of Dec 2024- He had COVID-19 infection, treated with Paxlovid Mid Jan 2025- he had influenza infection, at time of dx he was outside of tx window for Tamiflu  He experienced GI upset over the last weekend. He was seen by his PCP and given Azithromycin - completed yesterday.  Today he denies respiratory or GI/GU complaints today.  He endorses frequent travel over the holiday season.  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   Subjective:   1. Insulin  resistance  Latest Reference Range & Units 09/29/21 09:52 06/22/22 08:07  INSULIN  2.6 - 24.9 uIU/mL 7.1 8.4   He is not currently on any antidiabetic medications He was previously on   2. Polycystic kidney disease He has established with new Nephrologist- no change to current tx plan. Reviewed labs completed by [facility] Everywhere- serum creat elevated, with stable GFR in upper 50s  3. Essential hypertension Per pt- readings at previous healthcare visits SBP 130s DBP 70-80 He is currently on: amLODipine  (NORVASC) 5 MG tablet  hydrochlorothiazide  (HYDRODIURIL ) 25 MG tablet   4. Vitamin D  deficiency  Latest Reference Range & Units 09/29/21 09:52 06/22/22 08:07  Vitamin D , 25-Hydroxy 30.0 - 100.0 ng/mL 67.7 54.0   He is on daily OTC Vit D3 5,000 international units   5. Healthcare maintenance He has been acutely ill over the last several months- denies acute sx's at present. He reports energy levels slowly returning to normal  Assessment/Plan:   1. Insulin  resistance Check Labs - Hemoglobin A1c  2. Polycystic kidney disease F/u with Nephrologist  3. Essential hypertension F/u with PCP  4. Vitamin D  deficiency Check Labs - VITAMIN D  25 Hydroxy (Vit-D Deficiency, Fractures)  5. Healthcare maintenance Check Labs - TSH + free T4 - Vitamin B12  6. Obesity, current BMI 31.2  Seth Morales is currently in the action stage of change. As such, his goal is to continue with weight loss efforts. He has agreed to the Category 3 Plan.   Exercise goals: For substantial health benefits, adults should do at least 150 minutes (2 hours and 30 minutes) a week of moderate-intensity, or 75 minutes (1 hour and 15 minutes) a week of vigorous-intensity aerobic physical activity, or an equivalent combination of moderate- and vigorous-intensity aerobic activity. Aerobic activity should be performed in episodes of at least 10 minutes, and preferably, it should be spread throughout the week.  Behavioral modification strategies: increasing lean protein intake, decreasing simple carbohydrates, increasing vegetables, increasing water intake, no skipping meals,  meal planning and cooking strategies, keeping healthy foods in the home, ways to avoid boredom eating, and planning for success.  Seth Morales has agreed to follow-up with our clinic in 4 weeks. He was informed of the importance of frequent follow-up visits to maximize his success with intensive lifestyle modifications for his multiple health conditions.    Seth Morales was informed we would discuss his lab results at his next visit unless there is a critical issue that needs to be addressed sooner. Seth Morales agreed to keep his next visit at the agreed upon time to discuss these results.  Objective:   Blood pressure 135/76, pulse 72, temperature 98 F (36.7 C), height 6' 5 (1.956 m), weight 263 lb (119.3 kg), SpO2 96%. Body mass index is 31.19 kg/m.  General: Cooperative, alert, well developed, in no acute distress. HEENT: Conjunctivae and lids unremarkable. Cardiovascular: Regular rhythm.  Lungs: Normal work of breathing. Neurologic: No focal deficits.   Lab Results  Component Value Date   CREATININE 1.24 07/25/2022   BUN 28 (H) 07/25/2022   NA 137 07/25/2022   K 3.7 07/25/2022   CL 101 07/25/2022   CO2 26 07/25/2022   Lab Results  Component Value Date   ALT 34 06/22/2022   AST 31 06/22/2022   ALKPHOS 72 06/22/2022   BILITOT 0.7 06/22/2022   Lab Results  Component Value Date   HGBA1C 5.7 (H) 06/22/2022   HGBA1C 5.4 09/29/2021   HGBA1C 5.3 05/04/2021   HGBA1C 5.5 06/25/2020   HGBA1C 5.4 05/22/2019   Lab Results  Component Value Date   INSULIN  8.4 06/22/2022   INSULIN  7.1 09/29/2021   INSULIN  12.8 05/04/2021   INSULIN  8.5 06/25/2020   INSULIN  6.0 01/20/2020   Lab Results  Component Value Date   TSH 2.970 05/22/2019   Lab Results  Component Value Date   CHOL 180 06/25/2020   HDL 51 06/25/2020   LDLCALC 109 (H) 06/25/2020   LDLDIRECT 114.0 03/14/2019   TRIG 113 06/25/2020   CHOLHDL 3.4 01/20/2020   Lab Results  Component Value Date   VD25OH 54.0 06/22/2022   VD25OH 67.7 09/29/2021   VD25OH 74.0 05/04/2021   Lab Results  Component Value Date   WBC 8.3 07/25/2022   HGB 13.7 07/25/2022   HCT 41.0 07/25/2022   MCV 89.9 07/25/2022   PLT 199 07/25/2022   No results found for: IRON, TIBC, FERRITIN  Attestation Statements:   Reviewed by clinician on day of visit: allergies, medications, problem  list, medical history, surgical history, family history, social history, and previous encounter notes.  I have reviewed the above documentation for accuracy and completeness, and I agree with the above. -  Taquana Bartley d. Daemien Fronczak, NP-C

## 2023-04-14 LAB — HEMOGLOBIN A1C
Est. average glucose Bld gHb Est-mCnc: 131 mg/dL
Hgb A1c MFr Bld: 6.2 % — ABNORMAL HIGH (ref 4.8–5.6)

## 2023-04-14 LAB — TSH+FREE T4
Free T4: 1.54 ng/dL (ref 0.82–1.77)
TSH: 1.95 u[IU]/mL (ref 0.450–4.500)

## 2023-04-14 LAB — VITAMIN D 25 HYDROXY (VIT D DEFICIENCY, FRACTURES): Vit D, 25-Hydroxy: 68.5 ng/mL (ref 30.0–100.0)

## 2023-04-14 LAB — VITAMIN B12: Vitamin B-12: 788 pg/mL (ref 232–1245)

## 2023-04-19 ENCOUNTER — Emergency Department (HOSPITAL_BASED_OUTPATIENT_CLINIC_OR_DEPARTMENT_OTHER): Payer: 59

## 2023-04-19 ENCOUNTER — Other Ambulatory Visit: Payer: Self-pay

## 2023-04-19 ENCOUNTER — Encounter (HOSPITAL_BASED_OUTPATIENT_CLINIC_OR_DEPARTMENT_OTHER): Payer: Self-pay | Admitting: Radiology

## 2023-04-19 ENCOUNTER — Emergency Department (HOSPITAL_BASED_OUTPATIENT_CLINIC_OR_DEPARTMENT_OTHER)
Admission: EM | Admit: 2023-04-19 | Discharge: 2023-04-19 | Disposition: A | Discharge status: Home / Self Care | Payer: 59 | Attending: Emergency Medicine | Admitting: Emergency Medicine

## 2023-04-19 DIAGNOSIS — R109 Unspecified abdominal pain: Secondary | ICD-10-CM | POA: Insufficient documentation

## 2023-04-19 DIAGNOSIS — S29011A Strain of muscle and tendon of front wall of thorax, initial encounter: Secondary | ICD-10-CM | POA: Insufficient documentation

## 2023-04-19 DIAGNOSIS — X58XXXA Exposure to other specified factors, initial encounter: Secondary | ICD-10-CM | POA: Diagnosis not present

## 2023-04-19 DIAGNOSIS — Z940 Kidney transplant status: Secondary | ICD-10-CM | POA: Diagnosis not present

## 2023-04-19 DIAGNOSIS — I10 Essential (primary) hypertension: Secondary | ICD-10-CM | POA: Insufficient documentation

## 2023-04-19 DIAGNOSIS — Z79899 Other long term (current) drug therapy: Secondary | ICD-10-CM | POA: Insufficient documentation

## 2023-04-19 DIAGNOSIS — T148XXA Other injury of unspecified body region, initial encounter: Secondary | ICD-10-CM

## 2023-04-19 LAB — CBC WITH DIFFERENTIAL/PLATELET
Abs Immature Granulocytes: 0.04 10*3/uL (ref 0.00–0.07)
Basophils Absolute: 0 10*3/uL (ref 0.0–0.1)
Basophils Relative: 1 %
Eosinophils Absolute: 0.1 10*3/uL (ref 0.0–0.5)
Eosinophils Relative: 1 %
HCT: 37.1 % — ABNORMAL LOW (ref 39.0–52.0)
Hemoglobin: 12.8 g/dL — ABNORMAL LOW (ref 13.0–17.0)
Immature Granulocytes: 1 %
Lymphocytes Relative: 21 %
Lymphs Abs: 1.7 10*3/uL (ref 0.7–4.0)
MCH: 30.6 pg (ref 26.0–34.0)
MCHC: 34.5 g/dL (ref 30.0–36.0)
MCV: 88.8 fL (ref 80.0–100.0)
Monocytes Absolute: 0.6 10*3/uL (ref 0.1–1.0)
Monocytes Relative: 7 %
Neutro Abs: 5.7 10*3/uL (ref 1.7–7.7)
Neutrophils Relative %: 69 %
Platelets: 243 10*3/uL (ref 150–400)
RBC: 4.18 MIL/uL — ABNORMAL LOW (ref 4.22–5.81)
RDW: 14 % (ref 11.5–15.5)
WBC: 8.1 10*3/uL (ref 4.0–10.5)
nRBC: 0 % (ref 0.0–0.2)

## 2023-04-19 LAB — COMPREHENSIVE METABOLIC PANEL
ALT: 29 U/L (ref 0–44)
AST: 27 U/L (ref 15–41)
Albumin: 3.4 g/dL — ABNORMAL LOW (ref 3.5–5.0)
Alkaline Phosphatase: 56 U/L (ref 38–126)
Anion gap: 9 (ref 5–15)
BUN: 38 mg/dL — ABNORMAL HIGH (ref 8–23)
CO2: 29 mmol/L (ref 22–32)
Calcium: 8.8 mg/dL — ABNORMAL LOW (ref 8.9–10.3)
Chloride: 99 mmol/L (ref 98–111)
Creatinine, Ser: 1.32 mg/dL — ABNORMAL HIGH (ref 0.61–1.24)
GFR, Estimated: >60 mL/min (ref >60)
Glucose, Bld: 148 mg/dL — ABNORMAL HIGH (ref 70–99)
Potassium: 3.3 mmol/L — ABNORMAL LOW (ref 3.5–5.1)
Sodium: 137 mmol/L (ref 135–145)
Total Bilirubin: 1 mg/dL (ref 0.0–1.2)
Total Protein: 6 g/dL — ABNORMAL LOW (ref 6.5–8.1)

## 2023-04-19 LAB — D-DIMER, QUANTITATIVE: D-Dimer, Quant: 0.34 ug{FEU}/mL (ref 0.00–0.50)

## 2023-04-19 MED ORDER — LIDOCAINE 5 % EX PTCH
1.0000 | MEDICATED_PATCH | CUTANEOUS | Status: DC
  Administered 2023-04-19: 1 via TRANSDERMAL
  Filled 2023-04-19: qty 1

## 2023-04-19 NOTE — ED Notes (Signed)
D/c paperwork reviewed with pt, including follow up care.  All questions and/or concerns addressed at time of d/c.  No further needs expressed. . Pt verbalized understanding, Ambulatory without assistance to ED exit, NAD.

## 2023-04-19 NOTE — ED Provider Notes (Signed)
[facility] AT [facility] Provider Note   CSN: [id number] Arrival date & time: 04/19/23  2025     History  Chief Complaint  Patient presents with   Flank Pain    Seth Morales is a 63 y.o. male with past medical history of kidney transplant, HTN, gout presents to emergency department for evaluation of right lower thoracic cage pain.  He endorses this pain has been on and off for the past 25 years but has been worsened since having the flu and COVID 1 month ago.  He states that this pain worsens with movement and ventilation.  His brother was recently diagnosed with PE and also has a similar history of kidney transplant and recent COVID diagnosis, patient was worried that he to could have a PE.  He states that he reached out to his PCP who recommended emergency department evaluation for this pain.  At rest, patient has no pain.    Flank Pain Pertinent negatives include no chest pain, no abdominal pain, no headaches and no shortness of breath.       Home Medications Prior to Admission medications   Medication Sig Start Date End Date Taking? Authorizing Provider  allopurinol (ZYLOPRIM) 100 MG tablet Take 100 mg by mouth daily.    [provider]  amLODipine (NORVASC) 5 MG tablet     [provider]  Cholecalciferol (VITAMIN D) 125 MCG (5000 UT) CAPS Take 5,000 Units by mouth daily.     [provider]  cycloSPORINE modified (NEORAL) 100 MG capsule Take 100 mg by mouth 2 (two) times daily. 08/10/19   [provider]  cycloSPORINE modified (NEORAL) 25 MG capsule  08/12/19   [provider]  doxycycline (MONODOX) 100 MG capsule Take 100 mg by mouth 2 (two) times daily. 08/29/20   [provider]  hydrochlorothiazide (HYDRODIURIL) 25 MG tablet     [provider]  magnesium oxide-pyridoxine (BEELITH) 362-20 MG TABS Take 1 tablet by mouth daily.    [provider]  Multiple Vitamin  (MULTI-VITAMIN) tablet Take by mouth.    [provider]  mycophenolate (CELLCEPT) 250 MG capsule Take 750 mg by mouth 2 (two) times daily. 08/10/19   [provider]  Omega-3 Fatty Acids (FISH OIL PO) Take by mouth.    [provider]  selenium sulfide (SELSUN) 2.5 % shampoo Apply topically as directed. 07/15/19   [provider]  Sulfacetamide Sodium-Sulfur 8-4 % SUSP  10/16/18   [provider]      Allergies    Patient has no known allergies.    Review of Systems   Review of Systems  Constitutional:  Negative for chills, fatigue and fever.  Respiratory:  Negative for cough, chest tightness, shortness of breath and wheezing.   Cardiovascular:  Negative for chest pain and palpitations.  Gastrointestinal:  Negative for abdominal pain, constipation, diarrhea, nausea and vomiting.  Genitourinary:  Positive for flank pain.  Neurological:  Negative for dizziness, seizures, weakness, light-headedness, numbness and headaches.    Physical Exam Updated Vital Signs BP (!) 144/84   Pulse 60   Temp 97.7 F (36.5 C)   Resp 16   Ht 6\' 6"  (1.981 m)   Wt 120.2 kg   SpO2 100%   BMI 30.62 kg/m  Physical Exam Vitals and nursing note reviewed.  Constitutional:      General: He is not in acute distress.    Appearance: Normal appearance.  HENT:  Head: Normocephalic and atraumatic.  Eyes:     Conjunctiva/sclera: Conjunctivae normal.  Cardiovascular:     Rate and Rhythm: Normal rate.  Pulmonary:     Effort: Pulmonary effort is normal. No respiratory distress.     Breath sounds: Normal breath sounds.  Chest:     Chest wall: No tenderness.  Abdominal:     General: Bowel sounds are normal. There is no distension.     Palpations: Abdomen is soft.     Tenderness: There is no abdominal tenderness. There is no guarding.  Musculoskeletal:     Cervical back: Normal range of motion and neck supple. No rigidity or tenderness.     Right lower leg: No  edema.     Left lower leg: No edema.     Comments: No tenderness to palpation of right lower thoracic cage.  Pain in that location worsens with bending and twisting No BLE swelling nor tenderness.  Denna Haggard' sign negative x 2  Skin:    Capillary Refill: Capillary refill takes less than 2 seconds.     Coloration: Skin is not jaundiced or pale.  Neurological:     Mental Status: He is alert and oriented to person, place, and time. Mental status is at baseline.     Gait: Gait normal.     ED Results / Procedures / Treatments   Labs (all labs ordered are listed, but only abnormal results are displayed) Labs Reviewed  COMPREHENSIVE METABOLIC PANEL - Abnormal; Notable for the following components:      Result Value   Potassium 3.3 (*)    Glucose, Bld 148 (*)    BUN 38 (*)    Creatinine, Ser 1.32 (*)    Calcium 8.8 (*)    Total Protein 6.0 (*)    Albumin 3.4 (*)    All other components within normal limits  CBC WITH DIFFERENTIAL/PLATELET - Abnormal; Notable for the following components:   RBC 4.18 (*)    Hemoglobin 12.8 (*)    HCT 37.1 (*)    All other components within normal limits  D-DIMER, QUANTITATIVE    EKG None  Radiology DG Ribs Unilateral W/Chest Right Result Date: 04/19/2023 CLINICAL DATA:  Right-sided rib pain. EXAM: RIGHT RIBS AND CHEST - 3+ VIEW COMPARISON:  March 29, 2023 FINDINGS: A radiopaque marker was placed at the site of the patient's pain. No fracture or other bone lesions are seen involving the ribs. There is no evidence of pneumothorax or pleural effusion. Both lungs are clear. Heart size and mediastinal contours are within normal limits. IMPRESSION: Negative. Electronically Signed   By: Aram Candela M.D.   On: 04/19/2023 22:42    Procedures Procedures    Medications Ordered in ED Medications  lidocaine (LIDODERM) 5 % 1 patch (1 patch Transdermal Patch Applied 04/19/23 2253)    ED Course/ Medical Decision Making/ A&P                                  Medical Decision Making Amount and/or Complexity of Data Reviewed Labs: ordered. Radiology: ordered.  Risk Prescription drug management.   Patient presents to the ED for concern of right lower side pain, this involves an extensive number of treatment options, and is a complaint that carries with it a high risk of complications and morbidity.  The differential diagnosis includes MSK, rib fracture, strain, pneumonia, pneumothorax, PE   Co morbidities that complicate the patient evaluation  Kidney transplant recipient On chronic steroids   Additional history obtained:  Additional history obtained from Nursing   External records from outside source obtained and reviewed including triage RN note   Lab Tests:  I Ordered, and personally interpreted labs.  The pertinent results include:   D-dimer negative   Imaging Studies ordered:  I ordered imaging studies including chest x-ray I independently visualized and interpreted imaging which showed no fracture, nor cardiopulmonary process I agree with the radiologist interpretation    Medicines ordered and prescription drug management:  I ordered medication including lidocaine patch  for pain  Reevaluation of the patient after these medicines showed that the patient improved I have reviewed the patients home medicines and have made adjustments as needed    Problem List / ED Course:  Muscle strain Side pain Chest x-ray was negative for rib fracture, acute cardiopulmonary pathology D-dimer negative for clot As pain worsens with movement, I believe pain is related to MSK I recommended lidocaine patches for pain and for patient to follow-up with PCP as I currently do not see an emergent cause for pain I discussed ED workup, disposition, return to emergency department caution with patient expresses understanding agrees with plan.  All questions answered to his satisfaction.  He is agreeable to discharge.  Discharge  instructions provided on paperwork.   Reevaluation:  After the interventions noted above, I reevaluated the patient and found that they have :improved   Social Determinants of Health:  Has PCP follow-up   Dispostion:  After consideration of the diagnostic results and the patients response to treatment, I feel that the patent would benefit from outpatient management.   Final Clinical Impression(s) / ED Diagnoses Final diagnoses:  Muscle strain  Side pain    Rx / DC Orders ED Discharge Orders     None         Judithann Sheen, PA 04/19/23 2327    Virgina Norfolk, DO 04/20/23 1500

## 2023-04-19 NOTE — ED Triage Notes (Signed)
Pt has had covid and flu over the past 2 months. PT brother also had covid and flu and was having rib and back pain. He was seen at his doctor and had blood clots. Pt is now concerned that he may have clots. Pt has no symptoms at this time . Pt states in January he coughed and felt a pain in his side. He had a chest xray done after that happened which was negative. Pt just wants to be sure he doesn't have clots.

## 2023-04-19 NOTE — Discharge Instructions (Signed)
Thank you for letting us evaluate you today.  Your lab work was unremarkable.  Your D-dimer (lab work that test for clot) was negative for clot.  Your chest x-ray did not show fracture or abnormality of your right side.  This is likely musculoskeletal in nature  Please make sure to put lidocaine patches, use Tylenol as needed for pain

## 2023-05-08 ENCOUNTER — Encounter: Payer: Self-pay | Admitting: Family Medicine

## 2023-05-09 NOTE — Telephone Encounter (Signed)
 Re-faxed order to [facility] to get this process started.

## 2023-05-11 ENCOUNTER — Ambulatory Visit (INDEPENDENT_AMBULATORY_CARE_PROVIDER_SITE_OTHER): Payer: Self-pay | Admitting: Adult Health

## 2023-05-11 ENCOUNTER — Encounter (INDEPENDENT_AMBULATORY_CARE_PROVIDER_SITE_OTHER): Payer: Self-pay | Admitting: Adult Health

## 2023-05-11 VITALS — BP 135/76 | HR 64 | Temp 97.7°F | Ht 77.0 in | Wt 264.0 lb

## 2023-05-11 DIAGNOSIS — Z6833 Body mass index (BMI) 33.0-33.9, adult: Secondary | ICD-10-CM

## 2023-05-11 DIAGNOSIS — Q613 Polycystic kidney, unspecified: Secondary | ICD-10-CM

## 2023-05-11 DIAGNOSIS — I10 Essential (primary) hypertension: Secondary | ICD-10-CM | POA: Diagnosis not present

## 2023-05-11 DIAGNOSIS — E669 Obesity, unspecified: Secondary | ICD-10-CM

## 2023-05-11 DIAGNOSIS — E559 Vitamin D deficiency, unspecified: Secondary | ICD-10-CM | POA: Diagnosis not present

## 2023-05-11 DIAGNOSIS — E88819 Insulin resistance, unspecified: Secondary | ICD-10-CM | POA: Diagnosis not present

## 2023-05-11 DIAGNOSIS — Z6831 Body mass index (BMI) 31.0-31.9, adult: Secondary | ICD-10-CM

## 2023-05-11 DIAGNOSIS — E66811 Obesity, class 1: Secondary | ICD-10-CM

## 2023-05-11 DIAGNOSIS — Z0000 Encounter for general adult medical examination without abnormal findings: Secondary | ICD-10-CM

## 2023-05-11 NOTE — Progress Notes (Signed)
 Seth Morales [address] [place] [address] Phone: [phone] Subjective:   Seth Morales, am serving as a scribe for Dr. Antoine Primas.  I'm seeing this patient by the request  of:  Truett Perna, MD  CC: Right knee pain  LKG:[id number]  03/15/2023 Chronic problem with exacerbation.  Discussed icing regimen and home exercises, discussed which activities to do and which ones to avoid.  Increase activity slowly otherwise.  Follow-up again in 2 to 3 months     Arthritic changes of both knees.  Given injections today.  Tolerated the procedure well, discussed icing regimen and home exercises, increase activity slowly.  Patient will consider the possibility of replacement of the right knee in the near future if needed.  Follow-up again in 6 to 8 weeks.     Updated 05/16/2023 Seth Morales is a 63 y.o. male coming in with complaint of B knee pain and B hip pain. Two nights ago he has a cramp in L ankle when his foot forcefully DF. Antalgic gait and sharp pain over lateral aspect of distal tibia.   Discussing knee replacement on 05/29/2023 with orthopedics.  Shoulder pain has improved.   Pain in L GT persists. Injection in R hip decreased his pain.        Past Medical History:  Diagnosis Date   Allergy    Anemia    Back pain    Bilateral swelling of feet    Blood transfusion without reported diagnosis 13 years ago    Chronic kidney disease    Dermatitis    Hypertension    Joint pain    Kidney transplanted    Vitamin D deficiency    Past Surgical History:  Procedure Laterality Date   COLONOSCOPY  09/06/2012   at [place]   EYE SURGERY     INSERTION OF DIALYSIS CATHETER     KIDNEY TRANSPLANT  2006   KNEE ARTHROPLASTY Bilateral    NEPHRECTOMY Bilateral    Social History   Socioeconomic History   Marital status: Married    Spouse name: Not on file   Number of children: Not on file   Years of education: Not on file   Highest  education level: Not on file  Occupational History   Not on file  Tobacco Use   Smoking status: Never   Smokeless tobacco: Never  Vaping Use   Vaping status: Never Used  Substance and Sexual Activity   Alcohol use: Yes    Alcohol/week: 2.0 standard drinks of alcohol    Types: 2 Standard drinks or equivalent per week    Comment: occasionally   Drug use: No   Sexual activity: Not Currently  Other Topics Concern   Not on file  Social History Narrative   Not on file   Social Drivers of Health   Financial Resource Strain: Not on file  Food Insecurity: Low Risk  (03/09/2023)   Received from [facility]   Hunger Vital Sign    Worried About Running Out of Food in the Last Year: Never true    Ran Out of Food in the Last Year: Never true  Transportation Needs: No Transportation Needs (03/09/2023)   Received from [facility]    In the past 12 months, has lack of reliable transportation kept you from medical appointments, meetings, work or from getting things needed for daily living? : No  Physical Activity: Not on file  Stress: Not on  file  Social Connections: Not on file   No Known Allergies Family History  Problem Relation Age of Onset   Arthritis Mother    Hypertension Mother    Kidney disease Mother    Diabetes Mother    Diabetes Father    Colon cancer Neg Hx    Colon polyps Neg Hx    Esophageal cancer Neg Hx    Rectal cancer Neg Hx    Stomach cancer Neg Hx    Pancreatic cancer Neg Hx      Current Outpatient Medications (Cardiovascular):    amLODipine (NORVASC) 5 MG tablet,    hydrochlorothiazide (HYDRODIURIL) 25 MG tablet,    Current Outpatient Medications (Analgesics):    allopurinol (ZYLOPRIM) 100 MG tablet, Take 100 mg by mouth daily.   Current Outpatient Medications (Other):    Cholecalciferol (VITAMIN D) 125 MCG (5000 UT) CAPS, Take 5,000 Units by mouth daily.    cycloSPORINE modified (NEORAL) 100 MG capsule, Take 100 mg by mouth 2 (two)  times daily.   cycloSPORINE modified (NEORAL) 25 MG capsule,    magnesium oxide-pyridoxine (BEELITH) 362-20 MG TABS, Take 1 tablet by mouth daily.   Multiple Vitamin (MULTI-VITAMIN) tablet, Take by mouth.   mycophenolate (CELLCEPT) 250 MG capsule, Take 750 mg by mouth 2 (two) times daily.   Omega-3 Fatty Acids (FISH OIL PO), Take by mouth.   selenium sulfide (SELSUN) 2.5 % shampoo, Apply topically as directed.   Sulfacetamide Sodium-Sulfur 8-4 % SUSP,    doxycycline (MONODOX) 100 MG capsule, Take 100 mg by mouth 2 (two) times daily.   Reviewed prior external information including notes and imaging from  primary care provider As well as notes that were available from care everywhere and other healthcare systems.  Past medical history, social, surgical and family history all reviewed in electronic medical record.  No pertanent information unless stated regarding to the chief complaint.   Review of Systems:  No headache, visual changes, nausea, vomiting, diarrhea, constipation, dizziness, abdominal pain, skin rash, fevers, chills, night sweats, weight loss, swollen lymph nodes, chest pain, shortness of breath, mood changes. POSITIVE muscle aches, joint swelling, body aches  Objective  Blood pressure 138/88, pulse 88, height 6\' 5"  (1.956 m), weight 270 lb (122.5 kg), SpO2 98%.   General: No apparent distress alert and oriented x3 mood and affect normal, dressed appropriately.  HEENT: Pupils equal, extraocular movements intact  Respiratory: Patient's speak in full sentences and does not appear short of breath  Cardiovascular: No lower extremity edema, non tender, no erythema  Antalgic gait noted.  Patient does have some tenderness to palpation over the right knee.  Instability with valgus and varus force.  No crepitus noted.  After informed written and verbal consent, patient was seated on exam table. Right knee was prepped with alcohol swab and utilizing anterolateral approach, patient's  right knee space was injected with 4:1  marcaine 0.5%: Kenalog 40mg /dL. Patient tolerated the procedure well without immediate complications.    Impression and Recommendations:     The above documentation has been reviewed and is accurate and complete Judi Saa, DO

## 2023-05-11 NOTE — Progress Notes (Signed)
 WEIGHT SUMMARY AND BIOMETRICS  Vitals Temp: 97.7 F (36.5 C) BP: 135/76 Pulse Rate: 64 SpO2: 98 %   Anthropometric Measurements Height: 6\' 5"  (1.956 m) Weight: 264 lb (119.7 kg) BMI (Calculated): 31.3 Weight at Last Visit: 263 lb Weight Lost Since Last Visit: 0 Weight Gained Since Last Visit: 1 lb Starting Weight: 285 lb Total Weight Loss (lbs): 21 lb (9.526 kg)   Body Composition  Body Fat %: 35.1 % Fat Mass (lbs): 92.8 lbs Muscle Mass (lbs): 163.4 lbs Total Body Water (lbs): 133.42 lbs Visceral Fat Rating : 19   Other Clinical Data Fasting: no Labs: no Today's Visit #: 5 Starting Date: 05/22/19    Chief Complaint:   OBESITY Seth Morales is here to discuss his progress with his obesity treatment plan.  He is on the the Category 3 Plan and states he is following his eating plan approximately 50 % of the time.  He states he is exercising Gym 60 minutes 2 times per week.   Interim History:  04/19/2023 ED Encounter- Reviewed notes, labs, imaging from encounter EXAM: RIGHT RIBS AND CHEST - 3+ VIEW   COMPARISON:  March 29, 2023   FINDINGS: A radiopaque marker was placed at the site of the patient's pain. No fracture or other bone lesions are seen involving the ribs. There is no evidence of pneumothorax or pleural effusion. Both lungs are clear. Heart size and mediastinal contours are within normal limits.   IMPRESSION: Negative.  He experienced viral sinusitis- evaluated by TeleMedicine Provider on 05/04/2023 He treated sx's with OTC therapies. He paused working out while acutely ill.  Subjective:   1. Healthcare maintenance Discussed Labs  Latest Reference Range & Units 04/13/23 11:45  TSH 0.450 - 4.500 uIU/mL 1.950  T4,Free(Direct) 0.82 - 1.77 ng/dL 1.61   Vitamin W96 045 - 1,245 pg/mL 788   B12 and thyroid panel- normal and at goal  2. Polycystic kidney disease Discussed Labs  Latest Reference Range & Units 04/19/23 22:12  GFR,  Estimated >60 mL/min >60    Latest Reference Range & Units 04/19/23 22:12  Creatinine 0.61 - 1.24 mg/dL 4.09 (H)  (H): Data is abnormally high  CKD stable He recently established with new Nephrologist  3. Essential hypertension Discussed Labs 04/19/2023 CMP: Hypokalemia- he denies acute cardiac sx's Known CKD- stable CBG elevated  4. Insulin resistance Discussed Labs  Latest Reference Range & Units 04/13/23 11:45  Hemoglobin A1C 4.8 - 5.6 % 6.2 (H)  Est. average glucose Bld gHb Est-mCnc mg/dL 811  (H): Data is abnormally high  A1c and CBG both worsened and above goal He has been acutely ill and experienced R Rib pain in the last several months He is not currently on antidiabetic therapy  5. Vitamin D deficiency Discussed Labs  Latest Reference Range & Units 04/13/23 11:45  Vitamin D, 25-Hydroxy 30.0 - 100.0 ng/mL 68.5       He is on daily OTC Vit D 3 5,000 international units  He endorses increased fatigue, likely r/t to recent illnesses  Assessment/Plan:   1. Healthcare maintenance Remain well hydrated Follow healthy diet and increase activity when energy levels improve.  2. Polycystic kidney disease Continue to avoid Nephrotoxic substances F/u with established with Nephrologist  3. Essential hypertension Limit Na+ Remain well hydrated Follow healthy diet and increase activity when energy levels improve.  4. Insulin resistance (Primary) Remain well hydrated Follow healthy diet and increase activity when energy levels improve.  5. Vitamin D deficiency Continue  OTC Supplementation  6. Obesity, current BMI 31.4  Seth Morales is currently in the action stage of change. As such, his goal is to continue with weight loss efforts. He has agreed to the Category 3 Plan.   Exercise goals: For substantial health benefits, adults should do at least 150 minutes (2 hours and 30 minutes) a week of moderate-intensity, or 75 minutes (1 hour and 15 minutes) a week of  vigorous-intensity aerobic physical activity, or an equivalent combination of moderate- and vigorous-intensity aerobic activity. Aerobic activity should be performed in episodes of at least 10 minutes, and preferably, it should be spread throughout the week.  Behavioral modification strategies: increasing lean protein intake, decreasing simple carbohydrates, increasing vegetables, increasing water intake, no skipping meals, meal planning and cooking strategies, keeping healthy foods in the home, ways to avoid boredom eating, and planning for success.  Seth Morales has agreed to follow-up with our clinic in 4 weeks. He was informed of the importance of frequent follow-up visits to maximize his success with intensive lifestyle modifications for his multiple health conditions.   Objective:   Blood pressure 135/76, pulse 64, temperature 97.7 F (36.5 C), height 6\' 5"  (1.956 m), weight 264 lb (119.7 kg), SpO2 98%. Body mass index is 31.31 kg/m.  General: Cooperative, alert, well developed, in no acute distress. HEENT: Conjunctivae and lids unremarkable. Cardiovascular: Regular rhythm.  Lungs: Normal work of breathing. Neurologic: No focal deficits.   Lab Results  Component Value Date   CREATININE 1.32 (H) 04/19/2023   BUN 38 (H) 04/19/2023   NA 137 04/19/2023   K 3.3 (L) 04/19/2023   CL 99 04/19/2023   CO2 29 04/19/2023   Lab Results  Component Value Date   ALT 29 04/19/2023   AST 27 04/19/2023   ALKPHOS 56 04/19/2023   BILITOT 1.0 04/19/2023   Lab Results  Component Value Date   HGBA1C 6.2 (H) 04/13/2023   HGBA1C 5.7 (H) 06/22/2022   HGBA1C 5.4 09/29/2021   HGBA1C 5.3 05/04/2021   HGBA1C 5.5 06/25/2020   Lab Results  Component Value Date   INSULIN 8.4 06/22/2022   INSULIN 7.1 09/29/2021   INSULIN 12.8 05/04/2021   INSULIN 8.5 06/25/2020   INSULIN 6.0 01/20/2020   Lab Results  Component Value Date   TSH 1.950 04/13/2023   Lab Results  Component Value Date   CHOL 180  06/25/2020   HDL 51 06/25/2020   LDLCALC 109 (H) 06/25/2020   LDLDIRECT 114.0 03/14/2019   TRIG 113 06/25/2020   CHOLHDL 3.4 01/20/2020   Lab Results  Component Value Date   VD25OH 68.5 04/13/2023   VD25OH 54.0 06/22/2022   VD25OH 67.7 09/29/2021   Lab Results  Component Value Date   WBC 8.1 04/19/2023   HGB 12.8 (L) 04/19/2023   HCT 37.1 (L) 04/19/2023   MCV 88.8 04/19/2023   PLT 243 04/19/2023   No results found for: "IRON", "TIBC", "FERRITIN"  Attestation Statements:   Reviewed by clinician on day of visit: allergies, medications, problem list, medical history, surgical history, family history, social history, and previous encounter notes.  I have reviewed the above documentation for accuracy and completeness, and I agree with the above. -  Tamario Heal d. Pratt Bress, NP-C

## 2023-05-15 ENCOUNTER — Ambulatory Visit: Payer: 59 | Admitting: Family Medicine

## 2023-05-15 ENCOUNTER — Encounter: Payer: Self-pay | Admitting: Family Medicine

## 2023-05-15 VITALS — BP 138/88 | HR 88 | Ht 77.0 in | Wt 270.0 lb

## 2023-05-15 DIAGNOSIS — M170 Bilateral primary osteoarthritis of knee: Secondary | ICD-10-CM | POA: Diagnosis not present

## 2023-05-15 DIAGNOSIS — M7062 Trochanteric bursitis, left hip: Secondary | ICD-10-CM

## 2023-05-15 NOTE — Assessment & Plan Note (Signed)
 Stable at the moment.  Feels like it is doing relatively well.  Wants to continue with conservative therapy at this time.

## 2023-05-15 NOTE — Assessment & Plan Note (Signed)
 Still avoiding surgical intervention at this moment.  Is going to consider it thoroughly in the late summer probably.  Discussed icing regimen and home exercises, discussed which activities to do and which ones to avoid.  Patient recently did have difficulty with some lower leg pain but only very minimal.  Follow-up with me again in 6 to 8 weeks otherwise.

## 2023-05-15 NOTE — Patient Instructions (Signed)
Injected knee today 

## 2023-05-16 ENCOUNTER — Other Ambulatory Visit: Payer: Self-pay

## 2023-05-16 ENCOUNTER — Other Ambulatory Visit (INDEPENDENT_AMBULATORY_CARE_PROVIDER_SITE_OTHER)

## 2023-05-16 DIAGNOSIS — M2550 Pain in unspecified joint: Secondary | ICD-10-CM

## 2023-05-16 LAB — CBC WITH DIFFERENTIAL/PLATELET
Basophils Absolute: 0 10*3/uL (ref 0.0–0.1)
Basophils Relative: 0.2 % (ref 0.0–3.0)
Eosinophils Absolute: 0 10*3/uL (ref 0.0–0.7)
Eosinophils Relative: 0 % (ref 0.0–5.0)
HCT: 41.9 % (ref 39.0–52.0)
Hemoglobin: 13.9 g/dL (ref 13.0–17.0)
Lymphocytes Relative: 7.6 % — ABNORMAL LOW (ref 12.0–46.0)
Lymphs Abs: 1.3 10*3/uL (ref 0.7–4.0)
MCHC: 33.2 g/dL (ref 30.0–36.0)
MCV: 89.8 fl (ref 78.0–100.0)
Monocytes Absolute: 1 10*3/uL (ref 0.1–1.0)
Monocytes Relative: 5.8 % (ref 3.0–12.0)
Neutro Abs: 14.6 10*3/uL — ABNORMAL HIGH (ref 1.4–7.7)
Neutrophils Relative %: 86.4 % — ABNORMAL HIGH (ref 43.0–77.0)
Platelets: 255 10*3/uL (ref 150.0–400.0)
RBC: 4.67 Mil/uL (ref 4.22–5.81)
RDW: 14.4 % (ref 11.5–15.5)
WBC: 16.9 10*3/uL — ABNORMAL HIGH (ref 4.0–10.5)

## 2023-05-16 LAB — COMPREHENSIVE METABOLIC PANEL
ALT: 28 U/L (ref 0–53)
AST: 19 U/L (ref 0–37)
Albumin: 4.2 g/dL (ref 3.5–5.2)
Alkaline Phosphatase: 66 U/L (ref 39–117)
BUN: 45 mg/dL — ABNORMAL HIGH (ref 6–23)
CO2: 29 meq/L (ref 19–32)
Calcium: 9.8 mg/dL (ref 8.4–10.5)
Chloride: 96 meq/L (ref 96–112)
Creatinine, Ser: 1.52 mg/dL — ABNORMAL HIGH (ref 0.40–1.50)
GFR: 48.98 mL/min — ABNORMAL LOW (ref >60.00)
Glucose, Bld: 133 mg/dL — ABNORMAL HIGH (ref 70–99)
Potassium: 3.3 meq/L — ABNORMAL LOW (ref 3.5–5.1)
Sodium: 135 meq/L (ref 135–145)
Total Bilirubin: 0.7 mg/dL (ref 0.2–1.2)
Total Protein: 6.8 g/dL (ref 6.0–8.3)

## 2023-05-16 LAB — IBC PANEL
Iron: 64 ug/dL (ref 42–165)
Saturation Ratios: 26.4 % (ref 20.0–50.0)
TIBC: 242.2 ug/dL — ABNORMAL LOW (ref 250.0–450.0)
Transferrin: 173 mg/dL — ABNORMAL LOW (ref 212.0–360.0)

## 2023-05-16 LAB — FERRITIN: Ferritin: 291 ng/mL (ref 22.0–322.0)

## 2023-05-16 LAB — SEDIMENTATION RATE: Sed Rate: 19 mm/h (ref 0–20)

## 2023-05-17 ENCOUNTER — Encounter: Payer: Self-pay | Admitting: Family Medicine

## 2023-05-17 MED ORDER — DOXYCYCLINE HYCLATE 100 MG PO TABS
100.0000 mg | ORAL_TABLET | Freq: Two times a day (BID) | ORAL | 0 refills | Status: AC
Start: 2023-05-17 — End: 2023-05-24

## 2023-06-11 ENCOUNTER — Encounter: Payer: Self-pay | Admitting: Family Medicine

## 2023-07-03 ENCOUNTER — Ambulatory Visit (INDEPENDENT_AMBULATORY_CARE_PROVIDER_SITE_OTHER): Admitting: Adult Health

## 2023-07-25 NOTE — Progress Notes (Signed)
 [facility] [address] [place] [address] Phone: [phone] Subjective:   Seth Morales, am serving as a scribe for Dr. Ronnell Coins.  I'm seeing this patient by the request  of:  Carlye Child, MD  CC: Multiple issues.  ION:[id number]  05/15/2023 Stable at the moment.  Feels like it is doing relatively well.  Wants to continue with conservative therapy at this time.     Still avoiding surgical intervention at this moment.  Is going to consider it thoroughly in the late summer probably.  Discussed icing regimen and home exercises, discussed which activities to do and which ones to avoid.  Patient recently did have difficulty with some lower leg pain but only very minimal.  Follow-up with me again in 6 to 8 weeks otherwise.      Update 07/26/2023 Seth Morales is a 63 y.o. male coming in with complaint of R knee and L hip pain. Patient states that his knees are more painful as he has been very busy. Hopes to have   L hip pain is below the GT.   Also having increase in pain in R shoulder.      Past Medical History:  Diagnosis Date   Allergy    Anemia    Back pain    Bilateral swelling of feet    Blood transfusion without reported diagnosis 13 years ago    Chronic kidney disease    Dermatitis    Hypertension    Joint pain    Kidney transplanted    Vitamin D  deficiency    Past Surgical History:  Procedure Laterality Date   COLONOSCOPY  09/06/2012   at [place],   EYE SURGERY     INSERTION OF DIALYSIS CATHETER     KIDNEY TRANSPLANT  2006   KNEE ARTHROPLASTY Bilateral    NEPHRECTOMY Bilateral    Social History   Socioeconomic History   Marital status: Married    Spouse name: Not on file   Number of children: Not on file   Years of education: Not on file   Highest education level: Not on file  Occupational History   Not on file  Tobacco Use   Smoking status: Never   Smokeless tobacco: Never  Vaping Use   Vaping  status: Never Used  Substance and Sexual Activity   Alcohol use: Yes    Alcohol/week: 2.0 standard drinks of alcohol    Types: 2 Standard drinks or equivalent per week    Comment: occasionally   Drug use: No   Sexual activity: Not Currently  Other Topics Concern   Not on file  Social History Narrative   Not on file   Social Drivers of Health   Financial Resource Strain: Not on file  Food Insecurity: Low Risk  (07/20/2023)   Received from [facility]   Hunger Vital Sign    Worried About Running Out of Food in the Last Year: Never true    Ran Out of Food in the Last Year: Never true  Transportation Needs: No Transportation Needs (07/20/2023)   Received from [facility]    In the past 12 months, has lack of reliable transportation kept you from medical appointments, meetings, work or from getting things needed for daily living? : No  Physical Activity: Not on file  Stress: Not on file  Social Connections: Not on file   No Known Allergies Family History  Problem Relation Age of Onset  Arthritis Mother    Hypertension Mother    Kidney disease Mother    Diabetes Mother    Diabetes Father    Colon cancer Neg Hx    Colon polyps Neg Hx    Esophageal cancer Neg Hx    Rectal cancer Neg Hx    Stomach cancer Neg Hx    Pancreatic cancer Neg Hx      Current Outpatient Medications (Cardiovascular):    hydrochlorothiazide  (HYDRODIURIL ) 25 MG tablet,    amLODipine (NORVASC) 5 MG tablet,    Current Outpatient Medications (Analgesics):    allopurinol (ZYLOPRIM) 100 MG tablet, Take 100 mg by mouth daily.   Current Outpatient Medications (Other):    Cholecalciferol (VITAMIN D ) 125 MCG (5000 UT) CAPS, Take 5,000 Units by mouth daily.    cycloSPORINE modified (NEORAL) 100 MG capsule, Take 100 mg by mouth 2 (two) times daily.   cycloSPORINE modified (NEORAL) 25 MG capsule,    magnesium oxide-pyridoxine (BEELITH) 362-20 MG TABS, Take 1 tablet by mouth daily.    Multiple Vitamin (MULTI-VITAMIN) tablet, Take by mouth.   mycophenolate (CELLCEPT) 250 MG capsule, Take 750 mg by mouth 2 (two) times daily.   Omega-3 Fatty Acids (FISH OIL PO), Take by mouth.   selenium sulfide (SELSUN) 2.5 % shampoo, Apply topically as directed.   Sulfacetamide Sodium-Sulfur 8-4 % SUSP,    doxycycline  (MONODOX ) 100 MG capsule, Take 100 mg by mouth 2 (two) times daily.   Reviewed prior external information including notes and imaging from  primary care provider As well as notes that were available from care everywhere and other healthcare systems.  Past medical history, social, surgical and family history all reviewed in electronic medical record.  No pertanent information unless stated regarding to the chief complaint.   Review of Systems:  No headache, visual changes, nausea, vomiting, diarrhea, constipation, dizziness, abdominal pain, skin rash, fevers, chills, night sweats, weight loss, swollen lymph nodes, body aches, joint swelling, chest pain, shortness of breath, mood changes. POSITIVE muscle aches  Objective  Blood pressure 116/64, pulse 74, height 6\' 5"  (1.956 m), weight 272 lb (123.4 kg), SpO2 98%.   General: No apparent distress alert and oriented x3 mood and affect normal, dressed appropriately.  HEENT: Pupils equal, extraocular movements intact  Respiratory: Patient's speak in full sentences and does not appear short of breath  Cardiovascular: No lower extremity edema, non tender, no erythema  Right knee exam shows patient does have some tenderness to palpation noted.  Instability of the right knee noted.  Right hip does have tenderness to palpation over the greater trochanteric area.  Left hip also has some tenderness over the left greater trochanteric area Right shoulder by some mild limited range of motion.  Right shoulder positive impingement noted.   After verbal consent patient was prepped with alcohol swab and with a 21-gauge 2 inch needle injected  into the right greater trochanteric area with 2 cc of 0.5% Marcaine and 1 cc of Kenalog  40 mg/mL.  No blood loss.  Band-Aid placed.  Postinjection instructions given   After verbal consent patient was prepped with alcohol swab and with a 21-gauge 2 inch needle injected into the left greater trochanteric area with 2 cc of 0.5% Marcaine and 1 cc of Kenalog  40 mg/mL.  No blood loss.  Band-Aid placed.  Postinjection instructions given  After informed written and verbal consent, patient was seated on exam table. Right shoulder was prepped with alcohol swab and utilizing posterior approach, patient's right glenohumeral space  was injected with 4:1  marcaine 0.5%: Kenalog  40mg /dL. Patient tolerated the procedure well without immediate complications.   Impression and Recommendations:     The above documentation has been reviewed and is accurate and complete Stevi Hollinshead M Cresencio Reesor, DO

## 2023-07-26 ENCOUNTER — Ambulatory Visit: Admitting: Family Medicine

## 2023-07-26 ENCOUNTER — Encounter: Payer: Self-pay | Admitting: Family Medicine

## 2023-07-26 VITALS — BP 116/64 | HR 74 | Ht 77.0 in | Wt 272.0 lb

## 2023-07-26 DIAGNOSIS — M7061 Trochanteric bursitis, right hip: Secondary | ICD-10-CM

## 2023-07-26 DIAGNOSIS — M7552 Bursitis of left shoulder: Secondary | ICD-10-CM

## 2023-07-26 DIAGNOSIS — M7551 Bursitis of right shoulder: Secondary | ICD-10-CM

## 2023-07-26 DIAGNOSIS — M7062 Trochanteric bursitis, left hip: Secondary | ICD-10-CM

## 2023-07-26 NOTE — Assessment & Plan Note (Addendum)
 Bilateral injections given today, tolerated the procedure well, discussed icing regimen and home exercises.  Discussed which activities to do and which ones to avoid.  Follow-up with me again in 6 to 8 weeks.  Discussed with patient that there is a possibility that some of this is coming from his back.  Some of it is compensating for the right knee that likely is getting a replacement in the near future.  Patient has a single kidney which does make treatment with medications difficult.

## 2023-07-26 NOTE — Patient Instructions (Addendum)
 Injected b hips and R shoulder today

## 2023-07-26 NOTE — Assessment & Plan Note (Signed)
 Only right shoulder given injection today, tolerated the procedure well, discussed icing regimen and home exercises, patient is likely going to have surgical intervention and a right sided knee replacement in the coming months.  Discussed icing regimen and home exercises.  Increase activity slowly.  Follow-up again in 6 to 8 weeks otherwise.

## 2023-07-31 ENCOUNTER — Encounter: Payer: Self-pay | Admitting: Family Medicine

## 2023-08-08 ENCOUNTER — Other Ambulatory Visit: Payer: Self-pay | Admitting: Orthopaedic Surgery

## 2023-08-08 ENCOUNTER — Encounter (INDEPENDENT_AMBULATORY_CARE_PROVIDER_SITE_OTHER): Payer: Self-pay | Admitting: Adult Health

## 2023-08-08 ENCOUNTER — Ambulatory Visit (INDEPENDENT_AMBULATORY_CARE_PROVIDER_SITE_OTHER): Admitting: Adult Health

## 2023-08-08 VITALS — BP 138/81 | HR 75 | Temp 97.7°F | Ht 77.0 in | Wt 266.0 lb

## 2023-08-08 DIAGNOSIS — M25561 Pain in right knee: Secondary | ICD-10-CM | POA: Diagnosis not present

## 2023-08-08 DIAGNOSIS — E669 Obesity, unspecified: Secondary | ICD-10-CM

## 2023-08-08 DIAGNOSIS — Z6831 Body mass index (BMI) 31.0-31.9, adult: Secondary | ICD-10-CM

## 2023-08-08 DIAGNOSIS — Q613 Polycystic kidney, unspecified: Secondary | ICD-10-CM

## 2023-08-08 DIAGNOSIS — I10 Essential (primary) hypertension: Secondary | ICD-10-CM | POA: Diagnosis not present

## 2023-08-08 DIAGNOSIS — E88819 Insulin resistance, unspecified: Secondary | ICD-10-CM

## 2023-08-08 DIAGNOSIS — G8929 Other chronic pain: Secondary | ICD-10-CM

## 2023-08-08 DIAGNOSIS — Z6833 Body mass index (BMI) 33.0-33.9, adult: Secondary | ICD-10-CM

## 2023-08-08 NOTE — Progress Notes (Signed)
 WEIGHT SUMMARY AND BIOMETRICS  Vitals Temp: 97.7 F (36.5 C) BP: 138/81 Pulse Rate: 75 SpO2: 100 %   Anthropometric Measurements Height: 6\' 5"  (1.956 m) Weight: 266 lb (120.7 kg) BMI (Calculated): 31.54 Weight at Last Visit: 264 lb Weight Lost Since Last Visit: 0 Weight Gained Since Last Visit: 2 lb Starting Weight: 285 lb Total Weight Loss (lbs): 19 lb (8.618 kg)   Body Composition  Body Fat %: 34.9 % Fat Mass (lbs): 92.8 lbs Muscle Mass (lbs): 164.8 lbs Total Body Water (lbs): 127.2 lbs Visceral Fat Rating : 19   Other Clinical Data Fasting: no Labs: no Today's Visit #: 44 Starting Date: 05/22/19    Chief Complaint:   OBESITY Seth Morales is here to discuss his progress with his obesity treatment plan.  He is on the the Category 3 Plan and states he is following his eating plan approximately 50 % of the time.  He states he is exercising Walking 30-40 minutes 3-4 times per week.  Interim History:  Reviewed Bioimpedance results with pt: Muscle Mass: +1.4 lbs Adipose Mass: No Change  Seth Idol, MD - 06/01/2023 Nephrology OV Notes: -Agreeable to restart on losartan 50 mg twice daily in place of Norvasc 5 mg daily. -Will plan to repeat BMP in 2 weeks to ensure GFR stability. -Emphasized the importance of strict low-sodium diet. -Continue current dose of HCTZ 25 mg daily.  Subjective:   1. Chronic pain of right knee He has to cancel R TKA 2024 He has attempted to schedule replacement this summer- unable to schedule due to poor communication with [employer].  2. Insulin  resistance  Latest Reference Range & Units 05/04/21 11:18 09/29/21 09:52 06/22/22 08:07  INSULIN  2.6 - 24.9 uIU/mL 12.8 7.1 8.4   CKD - would avoid Metformin  Mounjaro /Zepbound  therapy stopped due to SE: hypoglycemia and nausea  3. Essential hypertension BP Slightly elevated at OV He endorses PROFOUND frustration with [facility] and trying to  schedule R TKA  4. Polycystic kidney disease Seth Idol, MD - 06/01/2023 10:00 AM EDT Formatting of this note is different from the original. Subjective: Seth Morales is a 63 y.o. 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 Patient was initially seen in [facility] [place]  where he had his transplant at [facility]. No posttransplant complications or DGF. Was seen at [facility] transplant up until 2014 where patient was lost to follow-up due to lack of insurance coverage. Established follow-up care with [facility] for several years post transplant however like to transfer care to [facility]. 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  Assessment:   1. Benign hypertension with chronic kidney disease, stage III (CMD)  2. Polycystic kidney disease  3. Leg swelling  4. Vitamin D  deficiency  5. Metabolic bone disease  6. Immunosuppressive management encounter following kidney transplant  7. Deceased-donor kidney transplant recipient    Plan:  -Creatinine stable and within baseline range. -Cyclosporine level at goal, continue triple immunosuppression. -Phos, bicarb, PTH, vitamin D , hemoglobin at goal for CKD stage. -Blood pressure acceptable but with increased lower extremity edema. -Agreeable to restart on losartan 50 mg twice daily in place of Norvasc 5 mg daily. -Will plan to repeat  BMP in 2 weeks to ensure GFR stability. -Emphasized the importance of strict low-sodium diet. -Continue current dose of HCTZ 25 mg daily. -Return to clinic in 6 months.   Assessment/Plan:   1. Chronic pain of right knee F/u with established  Orthopedic Surgeon Other local Orthopedic Practice information provided to pt  2. Insulin  resistance (Primary) Limit sugar/simple CHO intake Increase protein intake Remain active daily  3. Essential hypertension Limit sugar/simple CHO intake Increase protein intake Remain active daily  4. Polycystic kidney disease Keep BP well controlled Continue to avoid Nephrotoxic substances  5. Obesity, current BMI 31.5  Seth Morales is currently in the action stage of change. As such, his goal is to continue with weight loss efforts. He has agreed to the Category 3 Plan.   Exercise goals: All adults should avoid inactivity. Some physical activity is better than none, and adults who participate in any amount of physical activity gain some health benefits. Adults should also include muscle-strengthening activities that involve all major muscle groups on 2 or more days a week.  Behavioral modification strategies: increasing lean protein intake, decreasing simple carbohydrates, increasing vegetables, increasing water intake, meal planning and cooking strategies, keeping healthy foods in the home, ways to avoid boredom eating, and planning for success.  Inmer has agreed to follow-up with our clinic in 4 weeks. He was informed of the importance of frequent follow-up visits to maximize his success with intensive lifestyle modifications for his multiple health conditions.   Objective:   Blood pressure 138/81, pulse 75, temperature 97.7 F (36.5 C), height 6\' 5"  (1.956 m), weight 266 lb (120.7 kg), SpO2 100%. Body mass index is 31.54 kg/m.  General: Cooperative, alert, well developed, in no acute distress. HEENT: Conjunctivae and lids unremarkable. Cardiovascular: Regular rhythm.  Lungs: Normal work of breathing. Neurologic: No focal deficits.   Lab Results  Component Value Date   CREATININE 1.52 (H) 05/16/2023   BUN 45 (H) 05/16/2023   NA 135 05/16/2023   K 3.3 (L) 05/16/2023   CL 96  05/16/2023   CO2 29 05/16/2023   Lab Results  Component Value Date   ALT 28 05/16/2023   AST 19 05/16/2023   ALKPHOS 66 05/16/2023   BILITOT 0.7 05/16/2023   Lab Results  Component Value Date   HGBA1C 6.2 (H) 04/13/2023   HGBA1C 5.7 (H) 06/22/2022   HGBA1C 5.4 09/29/2021   HGBA1C 5.3 05/04/2021   HGBA1C 5.5 06/25/2020   Lab Results  Component Value Date   INSULIN  8.4 06/22/2022   INSULIN  7.1 09/29/2021   INSULIN  12.8 05/04/2021   INSULIN  8.5 06/25/2020   INSULIN  6.0 01/20/2020   Lab Results  Component Value Date   TSH 1.950 04/13/2023   Lab Results  Component Value Date   CHOL 180 06/25/2020   HDL 51 06/25/2020   LDLCALC 109 (H) 06/25/2020   LDLDIRECT 114.0 03/14/2019   TRIG 113 06/25/2020   CHOLHDL 3.4 01/20/2020   Lab Results  Component Value Date   VD25OH 68.5 04/13/2023   VD25OH 54.0 06/22/2022   VD25OH 67.7 09/29/2021   Lab Results  Component Value Date   WBC 16.9 (H) 05/16/2023   HGB 13.9 05/16/2023   HCT 41.9 05/16/2023   MCV 89.8 05/16/2023   PLT 255.0 05/16/2023   Lab Results  Component Value Date   IRON 64 05/16/2023   TIBC 242.2 (L) 05/16/2023   FERRITIN 291.0 05/16/2023   Attestation Statements:   Reviewed by clinician on day of visit: allergies, medications, problem  list, medical history, surgical history, family history, social history, and previous encounter notes.  I have reviewed the above documentation for accuracy and completeness, and I agree with the above. -  Cyndy Braver d. Djibril Glogowski, NP-C

## 2023-09-04 NOTE — Patient Instructions (Addendum)
 SURGICAL WAITING ROOM VISITATION Patients having surgery or a procedure may have no more than 2 support people in the waiting area - these visitors may rotate.    Children under the age of 79 must have an adult with them who is not the patient.  If the patient needs to stay at the hospital during part of their recovery, the visitor guidelines for inpatient rooms apply. Pre-op nurse will coordinate an appropriate time for 1 support person to accompany patient in pre-op.  This support person may not rotate.    Please refer to the [facility] website for the visitor guidelines for Inpatients (after your surgery is over and you are in a regular room).       Your procedure is scheduled on: 09-12-23   Report to [facility] Main Entrance    Report to admitting at 11:00 AM   Call this number if you have problems the morning of surgery [phone]   Do not eat food :After Midnight.   After Midnight you may have the following liquids until 10:30 AM DAY OF SURGERY  Water Non-Citrus Juices (without pulp, NO RED-Apple, White grape, White cranberry) Black Coffee (NO MILK/CREAM OR CREAMERS, sugar ok)  Clear Tea (NO MILK/CREAM OR CREAMERS, sugar ok) regular and decaf                             Plain Jell-O (NO RED)                                           Fruit ices (not with fruit pulp, NO RED)                                     Popsicles (NO RED)                                                               Sports drinks like Gatorade (NO RED)                   The day of surgery:  Drink ONE (1) Pre-Surgery Clear Ensure by 10:30 AM the morning of surgery. Drink in one sitting. Do not sip.  This drink was given to you during your hospital  pre-op appointment visit. Nothing else to drink after completing the Pre-Surgery Clear Ensure.          If you have questions, please contact your surgeon's office.   FOLLOW ANY ADDITIONAL PRE OP INSTRUCTIONS YOU RECEIVED FROM YOUR SURGEON'S  OFFICE!!!     Oral Hygiene is also important to reduce your risk of infection.                                    Remember - BRUSH YOUR TEETH THE MORNING OF SURGERY WITH YOUR REGULAR TOOTHPASTE   Do NOT smoke after Midnight   Take these medicines the morning of surgery with A SIP OF WATER:    Allopurinol   Doxycycline   Mycophenolate   Neoral (Cyclosporine)   Prednisone   Stop all vitamins and herbal supplements 7 days before surgery  Bring CPAP mask and tubing day of surgery.                              You may not have any metal on your body including  jewelry, and body piercing             Do not wear  lotions, powders, cologne, or deodorant              Men may shave face and neck.   Do not bring valuables to the hospital. [facility] IS NOT RESPONSIBLE   FOR VALUABLES.   Contacts, dentures or bridgework may not be worn into surgery.   Bring small overnight bag day of surgery.   DO NOT BRING YOUR HOME MEDICATIONS TO THE HOSPITAL. PHARMACY WILL DISPENSE MEDICATIONS LISTED ON YOUR MEDICATION LIST TO YOU DURING YOUR ADMISSION IN THE HOSPITAL!     Special Instructions: Bring a copy of your healthcare power of attorney and living will documents the day of surgery if you haven't scanned them before.              Please read over the following fact sheets you were given: IF YOU HAVE QUESTIONS ABOUT YOUR PRE-OP INSTRUCTIONS PLEASE CALL [phone] [facility]  If you received a COVID test during your pre-op visit  it is requested that you wear a mask when out in public, stay away from anyone that may not be feeling well and notify your surgeon if you develop symptoms. If you test positive for Covid or have been in contact with anyone that has tested positive in the last 10 days please notify you surgeon.  [facility] - Preparing for Surgery   Pre-operative 5 CHG Bath Instructions   You can play a key role in reducing the risk of infection after surgery. Your skin needs to be as  free of germs as possible. You can reduce the number of germs on your skin by washing with CHG (chlorhexidine gluconate) soap before surgery. CHG is an antiseptic soap that kills germs and continues to kill germs even after washing.   DO NOT use if you have an allergy to chlorhexidine/CHG or antibacterial soaps. If your skin becomes reddened or irritated, stop using the CHG and notify one of our RNs at [phone].   Please shower with the CHG soap starting 4 days before surgery using the following schedule:     Please keep in mind the following:  DO NOT shave, including legs and underarms, starting the day of your first shower.   You may shave your face at any point before/day of surgery.  Place clean sheets on your bed the day you start using CHG soap. Use a clean washcloth (not used since being washed) for each shower. DO NOT sleep with pets once you start using the CHG.   CHG Shower Instructions:  If you choose to wash your hair and private area, wash first with your normal shampoo/soap.  After you use shampoo/soap, rinse your hair and body thoroughly to remove shampoo/soap residue.  Turn the water OFF and apply about 3 tablespoons (45 ml) of CHG soap to a CLEAN washcloth.  Apply CHG soap ONLY FROM YOUR NECK DOWN TO YOUR TOES (washing for 3-5 minutes)  DO NOT use CHG soap on face, private areas, open wounds, or sores.  Pay special attention to the area where your surgery is being performed.  If you are having back surgery, having someone wash your back for you may be helpful. Wait 2 minutes after CHG soap is applied, then you may rinse off the CHG soap.  Pat dry with a clean towel  Put on clean clothes/pajamas   If you choose to wear lotion, please use ONLY the CHG-compatible lotions on the back of this paper.     Additional instructions for the day of surgery: DO NOT APPLY any lotions, deodorants, cologne, or perfumes.   Put on clean/comfortable clothes.  Brush your teeth.  Ask  your nurse before applying any prescription medications to the skin.      CHG Compatible Lotions   Aveeno Moisturizing lotion  Cetaphil Moisturizing Cream  Cetaphil Moisturizing Lotion  Clairol Herbal Essence Moisturizing Lotion, Dry Skin  Clairol Herbal Essence Moisturizing Lotion, Extra Dry Skin  Clairol Herbal Essence Moisturizing Lotion, Normal Skin  Curel Age Defying Therapeutic Moisturizing Lotion with Alpha Hydroxy  Curel Extreme Care Body Lotion  Curel Soothing Hands Moisturizing Hand Lotion  Curel Therapeutic Moisturizing Cream, Fragrance-Free  Curel Therapeutic Moisturizing Lotion, Fragrance-Free  Curel Therapeutic Moisturizing Lotion, Original Formula  Eucerin Daily Replenishing Lotion  Eucerin Dry Skin Therapy Plus Alpha Hydroxy Crme  Eucerin Dry Skin Therapy Plus Alpha Hydroxy Lotion  Eucerin Original Crme  Eucerin Original Lotion  Eucerin Plus Crme Eucerin Plus Lotion  Eucerin TriLipid Replenishing Lotion  Keri Anti-Bacterial Hand Lotion  Keri Deep Conditioning Original Lotion Dry Skin Formula Softly Scented  Keri Deep Conditioning Original Lotion, Fragrance Free Sensitive Skin Formula  Keri Lotion Fast Absorbing Fragrance Free Sensitive Skin Formula  Keri Lotion Fast Absorbing Softly Scented Dry Skin Formula  Keri Original Lotion  Keri Skin Renewal Lotion Keri Silky Smooth Lotion  Keri Silky Smooth Sensitive Skin Lotion  Nivea Body Creamy Conditioning Oil  Nivea Body Extra Enriched Lotion  Nivea Body Original Lotion  Nivea Body Sheer Moisturizing Lotion Nivea Crme  Nivea Skin Firming Lotion  NutraDerm 30 Skin Lotion  NutraDerm Skin Lotion  NutraDerm Therapeutic Skin Cream  NutraDerm Therapeutic Skin Lotion  ProShield Protective Hand Cream  Provon moisturizing lotion   PATIENT SIGNATURE_________________________________  NURSE SIGNATURE__________________________________  ________________________________________________________________________     Seth Morales  An incentive spirometer is a tool that can help keep your lungs clear and active. This tool measures how well you are filling your lungs with each breath. Taking long deep breaths may help reverse or decrease the chance of developing breathing (pulmonary) problems (especially infection) following: A long period of time when you are unable to move or be active. BEFORE THE PROCEDURE  If the spirometer includes an indicator to show your best effort, your nurse or respiratory therapist will set it to a desired goal. If possible, sit up straight or lean slightly forward. Try not to slouch. Hold the incentive spirometer in an upright position. INSTRUCTIONS FOR USE  Sit on the edge of your bed if possible, or sit up as far as you can in bed or on a chair. Hold the incentive spirometer in an upright position. Breathe out normally. Place the mouthpiece in your mouth and seal your lips tightly around it. Breathe in slowly and as deeply as possible, raising the piston or the ball toward the top of the column. Hold your breath for 3-5 seconds or for as long as possible. Allow the piston or ball to fall to the bottom of the column. Remove the mouthpiece  from your mouth and breathe out normally. Rest for a few seconds and repeat Steps 1 through 7 at least 10 times every 1-2 hours when you are awake. Take your time and take a few normal breaths between deep breaths. The spirometer may include an indicator to show your best effort. Use the indicator as a goal to work toward during each repetition. After each set of 10 deep breaths, practice coughing to be sure your lungs are clear. If you have an incision (the cut made at the time of surgery), support your incision when coughing by placing a pillow or rolled up towels firmly against it. Once you are able to get out of bed, walk around indoors and cough well. You may stop using the incentive spirometer when instructed by your caregiver.   RISKS AND COMPLICATIONS Take your time so you do not get dizzy or light-headed. If you are in pain, you may need to take or ask for pain medication before doing incentive spirometry. It is harder to take a deep breath if you are having pain. AFTER USE Rest and breathe slowly and easily. It can be helpful to keep track of a log of your progress. Your caregiver can provide you with a simple table to help with this. If you are using the spirometer at home, follow these instructions: SEEK MEDICAL CARE IF:  You are having difficultly using the spirometer. You have trouble using the spirometer as often as instructed. Your pain medication is not giving enough relief while using the spirometer. You develop fever of 100.5 F (38.1 C) or higher. SEEK IMMEDIATE MEDICAL CARE IF:  You cough up bloody sputum that had not been present before. You develop fever of 102 F (38.9 C) or greater. You develop worsening pain at or near the incision site. MAKE SURE YOU:  Understand these instructions. Will watch your condition. Will get help right away if you are not doing well or get worse. Document Released: 07/04/2006 Document Revised: 05/16/2011 Document Reviewed: 09/04/2006 [facility] Patient Information 2014 [place], [place].   ________________________________________________________________________

## 2023-09-04 NOTE — Progress Notes (Addendum)
  Date of COVID positive in last 90 days:  No  PCP - Talitha Flatten, MD Cardiologist - N/A Nephrologist - [name], MD  Chest x-ray - 03-29-23 [facility] EKG - 07-20-23 [facility], copy in media Stress Test - Yes, several years ago ECHO - N/A Cardiac Cath - N/A Pacemaker/ICD device last checked:  N/A Spinal Cord Stimulator:  N/A  Bowel Prep - N/A  Sleep Study - N/A CPAP -   Fasting Blood Sugar - N/A Checks Blood Sugar _____ times a day  Last dose of GLP1 agonist-  N/A GLP1 instructions:  Do not take after     Last dose of SGLT-2 inhibitors-  N/A SGLT-2 instructions:  Do not take after    Blood Thinner Instructions:  N/A Aspirin  Instructions: Last Dose:  Activity level:  Can go up a flight of stairs and perform activities of daily living without stopping and without symptoms of chest pain or shortness of breath.  Anesthesia review: N/A  Patient denies shortness of breath, fever, cough and chest pain at PAT appointment  Patient verbalized understanding of instructions that were given to them at the PAT appointment. Patient was also instructed that they will need to review over the PAT instructions again at home before surgery.

## 2023-09-05 ENCOUNTER — Encounter (HOSPITAL_COMMUNITY)
Admission: RE | Admit: 2023-09-05 | Discharge: 2023-09-05 | Disposition: A | Discharge status: Home / Self Care | Source: Ambulatory Visit | Attending: Orthopaedic Surgery | Admitting: Orthopaedic Surgery

## 2023-09-05 ENCOUNTER — Encounter (HOSPITAL_COMMUNITY): Payer: Self-pay

## 2023-09-05 ENCOUNTER — Other Ambulatory Visit: Payer: Self-pay

## 2023-09-05 VITALS — BP 153/85 | HR 73 | Temp 98.1°F | Resp 12 | Ht 77.0 in | Wt 273.6 lb

## 2023-09-05 DIAGNOSIS — Z01812 Encounter for preprocedural laboratory examination: Secondary | ICD-10-CM | POA: Diagnosis present

## 2023-09-05 DIAGNOSIS — I10 Essential (primary) hypertension: Secondary | ICD-10-CM | POA: Diagnosis not present

## 2023-09-05 DIAGNOSIS — Z01818 Encounter for other preprocedural examination: Secondary | ICD-10-CM

## 2023-09-05 HISTORY — DX: Unspecified malignant neoplasm of skin, unspecified: C44.90

## 2023-09-05 HISTORY — DX: Polycystic kidney, unspecified: Q61.3

## 2023-09-05 HISTORY — DX: Unspecified osteoarthritis, unspecified site: M19.90

## 2023-09-05 HISTORY — DX: Personal history of urinary calculi: Z87.442

## 2023-09-05 LAB — CBC
HCT: 41.8 % (ref 39.0–52.0)
Hemoglobin: 14 g/dL (ref 13.0–17.0)
MCH: 30.4 pg (ref 26.0–34.0)
MCHC: 33.5 g/dL (ref 30.0–36.0)
MCV: 90.7 fL (ref 80.0–100.0)
Platelets: 208 10*3/uL (ref 150–400)
RBC: 4.61 MIL/uL (ref 4.22–5.81)
RDW: 13.8 % (ref 11.5–15.5)
WBC: 8 10*3/uL (ref 4.0–10.5)
nRBC: 0 % (ref 0.0–0.2)

## 2023-09-05 LAB — BASIC METABOLIC PANEL WITH GFR
Anion gap: 9 (ref 5–15)
BUN: 34 mg/dL — ABNORMAL HIGH (ref 8–23)
CO2: 26 mmol/L (ref 22–32)
Calcium: 9.2 mg/dL (ref 8.9–10.3)
Chloride: 99 mmol/L (ref 98–111)
Creatinine, Ser: 1.3 mg/dL — ABNORMAL HIGH (ref 0.61–1.24)
GFR, Estimated: >60 mL/min (ref >60)
Glucose, Bld: 130 mg/dL — ABNORMAL HIGH (ref 70–99)
Potassium: 3.6 mmol/L (ref 3.5–5.1)
Sodium: 134 mmol/L — ABNORMAL LOW (ref 135–145)

## 2023-09-05 LAB — SURGICAL PCR SCREEN
MRSA, PCR: NEGATIVE
Staphylococcus aureus: NEGATIVE

## 2023-09-05 NOTE — H&P (Signed)
 TOTAL KNEE ADMISSION H&P  Patient is being admitted for right total knee arthroplasty.  Subjective:  Chief Complaint:right knee pain.  HPI: Seth Morales, 63 y.o. male, has a history of pain and functional disability in the right knee due to arthritis and has failed non-surgical conservative treatments for greater than 12 weeks to includeNSAID's and/or analgesics, corticosteriod injections, flexibility and strengthening excercises, use of assistive devices, weight reduction as appropriate, and activity modification.  Onset of symptoms was gradual, starting 5 years ago with gradually worsening course since that time. The patient noted no past surgery on the right knee(s).  Patient currently rates pain in the right knee(s) at 10 out of 10 with activity. Patient has night pain, worsening of pain with activity and weight bearing, pain that interferes with activities of daily living, crepitus, and joint swelling.  Patient has evidence of subchondral cysts, subchondral sclerosis, periarticular osteophytes, and joint space narrowing by imaging studies. There is no active infection.  Patient Active Problem List   Diagnosis Date Noted   Bilateral shoulder bursitis 11/24/2022   Piriformis syndrome of right side 06/08/2022   Bilateral lower extremity edema 11/02/2021   SOBOE (shortness of breath on exertion) 09/29/2021   At risk for complication associated with hypotension 09/29/2021   Lateral epicondylitis, right elbow 08/03/2021   Bruise 05/14/2021   Dyslipidemia 05/04/2021   Greater trochanteric bursitis of both hips 03/17/2021   Degenerative arthritis of knee, bilateral 02/10/2021   Left knee pain 02/10/2021   Other constipation 12/21/2020   Subluxation of extensor carpi ulnaris tendon, left, initial encounter 11/04/2020   Depression 09/15/2020   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   Insulin  resistance 06/29/2020   Class 1 obesity with serious comorbidity and body mass index (BMI) of  33.0 to 33.9 in adult 06/25/2020   Pain and swelling of right knee 06/04/2020   Partial tear of left hamstring 03/30/2020   Vitamin D  deficiency 09/25/2019   Polycystic kidney disease December 05, 2014   Essential hypertension 12/05/14   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   Gout 02/02/2011   Past Medical History:  Diagnosis Date   Allergy    Anemia    Arthritis    Back pain    Bilateral swelling of feet    Blood transfusion without reported diagnosis 13 years ago    Chronic kidney disease    Dermatitis    History of kidney stones    Hypertension    Joint pain    Kidney transplanted    Polycystic kidney disease    Had transplant   Skin cancer    Vitamin D  deficiency     Past Surgical History:  Procedure Laterality Date   COLONOSCOPY  09/06/2012   at [place]   EYE SURGERY     INSERTION OF DIALYSIS CATHETER     KIDNEY TRANSPLANT  2006   KNEE ARTHROSCOPY Bilateral    MOHS SURGERY     Face   NEPHRECTOMY Bilateral     No current facility-administered medications for this encounter.   Current Outpatient Medications  Medication Sig Dispense Refill Last Dose/Taking   allopurinol (ZYLOPRIM) 100 MG tablet Take 100 mg by mouth daily.   Taking   cetirizine (ZYRTEC) 10 MG tablet Take 10 mg by mouth daily.   Taking   Cholecalciferol (VITAMIN D ) 125 MCG (5000 UT) CAPS Take 5,000 Units by mouth daily.    Taking   cycloSPORINE modified (NEORAL) 100 MG capsule Take 100 mg by mouth 2 (two) times daily.  Taking   cycloSPORINE modified (NEORAL) 25 MG capsule Take 25 mg by mouth 2 (two) times daily.   Taking   desonide (DESOWEN) 0.05 % cream Apply 1 Application topically 2 (two) times daily.   Taking   doxycycline  (VIBRAMYCIN ) 100 MG capsule Take 100 mg by mouth 2 (two) times daily.   Taking   hydrochlorothiazide  (HYDRODIURIL ) 25 MG tablet Take 25 mg by mouth daily.   Taking   losartan (COZAAR) 50 MG tablet Take 50 mg by mouth 2 (two) times daily.   Taking    magnesium oxide-pyridoxine (BEELITH) 362-20 MG TABS Take 1 tablet by mouth daily.   Taking   Multiple Vitamin (MULTI-VITAMIN) tablet Take 1 tablet by mouth daily.   Taking   mycophenolate (CELLCEPT) 250 MG capsule Take 750 mg by mouth 2 (two) times daily.   Taking   Omega-3 Fatty Acids (FISH OIL PO) Take 1 capsule by mouth daily.   Taking   predniSONE  (DELTASONE ) 5 MG tablet Take 5 mg by mouth daily with breakfast.   Taking   No Known Allergies  Social History   Tobacco Use   Smoking status: Never   Smokeless tobacco: Never  Substance Use Topics   Alcohol use: Yes    Alcohol/week: 2.0 standard drinks of alcohol    Types: 2 Standard drinks or equivalent per week    Comment: occasionally    Family History  Problem Relation Age of Onset   Arthritis Mother    Hypertension Mother    Kidney disease Mother    Diabetes Mother    Diabetes Father    Colon cancer Neg Hx    Colon polyps Neg Hx    Esophageal cancer Neg Hx    Rectal cancer Neg Hx    Stomach cancer Neg Hx    Pancreatic cancer Neg Hx      Review of Systems  Musculoskeletal:  Positive for arthralgias.       Right knee  All other systems reviewed and are negative.   Objective:  Physical Exam Constitutional:      Appearance: Normal appearance.  HENT:     Head: Normocephalic and atraumatic.     Nose: Nose normal.     Mouth/Throat:     Pharynx: Oropharynx is clear.   Eyes:     Extraocular Movements: Extraocular movements intact.   Pulmonary:     Effort: Pulmonary effort is normal.  Abdominal:     Palpations: Abdomen is soft.   Musculoskeletal:     Cervical back: Normal range of motion.     Comments: The right knee moves 0-120.  He has medial joint line pain and crepitation.  I do not feel an effusion.  Hip motion is good and straight leg raise is negative.  He has intact sensation and motor function in his feet with palpable pulses on both sides.    Skin:    General: Skin is warm and dry.   Neurological:      General: No focal deficit present.     Mental Status: He is alert and oriented to person, place, and time.   Psychiatric:        Mood and Affect: Mood normal.        Behavior: Behavior normal.        Thought Content: Thought content normal.     Vital signs in last 24 hours: Temp:  [98.1 F (36.7 C)] 98.1 F (36.7 C) (07/01 1256) Pulse Rate:  [73] 73 (07/01 1256) Resp:  [  12] 12 (07/01 1256) BP: (153-178)/(85-95) 153/85 (07/01 1326) SpO2:  [99 %] 99 % (07/01 1256) Weight:  [124.1 kg] 124.1 kg (07/01 1256)  Labs:   Estimated body mass index is 32.44 kg/m as calculated from the following:   Height as of 09/05/23: 6' 5 (1.956 m).   Weight as of 09/05/23: 124.1 kg.   Imaging Review Plain radiographs demonstrate severe degenerative joint disease of the right knee(s). The overall alignment isneutral. The bone quality appears to be good for age and reported activity level.    Assessment/Plan:  End stage primary arthritis, right knee   The patient history, physical examination, clinical judgment of the provider and imaging studies are consistent with end stage degenerative joint disease of the right knee(s) and total knee arthroplasty is deemed medically necessary. The treatment options including medical management, injection therapy arthroscopy and arthroplasty were discussed at length. The risks and benefits of total knee arthroplasty were presented and reviewed. The risks due to aseptic loosening, infection, stiffness, patella tracking problems, thromboembolic complications and other imponderables were discussed. The patient acknowledged the explanation, agreed to proceed with the plan and consent was signed. Patient is being admitted for inpatient treatment for surgery, pain control, PT, OT, prophylactic antibiotics, VTE prophylaxis, progressive ambulation and ADL's and discharge planning. The patient is planning to be discharged home with home health services   Patient's  anticipated LOS is less than 2 midnights, meeting these requirements: - Younger than 24 - Lives within 1 hour of care - Has a competent adult at home to recover with post-op recover - NO history of  - Chronic pain requiring opiods  - Diabetes  - Coronary Artery Disease  - Heart failure  - Heart attack  - Stroke  - DVT/VTE  - Cardiac arrhythmia  - Respiratory Failure/COPD  - Renal failure  - Anemia  - Advanced Liver disease

## 2023-09-12 ENCOUNTER — Other Ambulatory Visit: Payer: Self-pay

## 2023-09-12 ENCOUNTER — Inpatient Hospital Stay (HOSPITAL_COMMUNITY)
Admission: RE | Admit: 2023-09-12 | Discharge: 2023-09-16 | DRG: 470 | Disposition: A | Discharge status: Home / Self Care | Source: Ambulatory Visit | Attending: Orthopaedic Surgery | Admitting: Orthopaedic Surgery

## 2023-09-12 ENCOUNTER — Ambulatory Visit (HOSPITAL_COMMUNITY)

## 2023-09-12 ENCOUNTER — Encounter (HOSPITAL_COMMUNITY): Payer: Self-pay | Admitting: Orthopaedic Surgery

## 2023-09-12 ENCOUNTER — Encounter (HOSPITAL_COMMUNITY)
Admission: RE | Disposition: A | Discharge status: Home / Self Care | Payer: Self-pay | Source: Ambulatory Visit | Attending: Orthopaedic Surgery

## 2023-09-12 DIAGNOSIS — E66811 Obesity, class 1: Secondary | ICD-10-CM | POA: Diagnosis present

## 2023-09-12 DIAGNOSIS — Z841 Family history of disorders of kidney and ureter: Secondary | ICD-10-CM

## 2023-09-12 DIAGNOSIS — Z8249 Family history of ischemic heart disease and other diseases of the circulatory system: Secondary | ICD-10-CM

## 2023-09-12 DIAGNOSIS — Z8261 Family history of arthritis: Secondary | ICD-10-CM

## 2023-09-12 DIAGNOSIS — M1711 Unilateral primary osteoarthritis, right knee: Principal | ICD-10-CM | POA: Diagnosis present

## 2023-09-12 DIAGNOSIS — I10 Essential (primary) hypertension: Principal | ICD-10-CM

## 2023-09-12 DIAGNOSIS — Z905 Acquired absence of kidney: Secondary | ICD-10-CM

## 2023-09-12 DIAGNOSIS — Z6833 Body mass index (BMI) 33.0-33.9, adult: Secondary | ICD-10-CM

## 2023-09-12 DIAGNOSIS — Z85828 Personal history of other malignant neoplasm of skin: Secondary | ICD-10-CM

## 2023-09-12 DIAGNOSIS — E785 Hyperlipidemia, unspecified: Secondary | ICD-10-CM | POA: Diagnosis present

## 2023-09-12 DIAGNOSIS — Z7982 Long term (current) use of aspirin: Secondary | ICD-10-CM

## 2023-09-12 DIAGNOSIS — Z96651 Presence of right artificial knee joint: Secondary | ICD-10-CM

## 2023-09-12 DIAGNOSIS — Z833 Family history of diabetes mellitus: Secondary | ICD-10-CM

## 2023-09-12 DIAGNOSIS — Z7952 Long term (current) use of systemic steroids: Secondary | ICD-10-CM

## 2023-09-12 DIAGNOSIS — E559 Vitamin D deficiency, unspecified: Secondary | ICD-10-CM | POA: Diagnosis present

## 2023-09-12 DIAGNOSIS — Z79624 Long term (current) use of inhibitors of nucleotide synthesis: Secondary | ICD-10-CM

## 2023-09-12 DIAGNOSIS — Z79899 Other long term (current) drug therapy: Secondary | ICD-10-CM

## 2023-09-12 DIAGNOSIS — Z940 Kidney transplant status: Secondary | ICD-10-CM

## 2023-09-12 HISTORY — PX: TOTAL KNEE ARTHROPLASTY: SHX125

## 2023-09-12 SURGERY — ARTHROPLASTY, KNEE, TOTAL
Anesthesia: Regional | Site: Knee | Laterality: Right

## 2023-09-12 MED ORDER — ALUM & MAG HYDROXIDE-SIMETH 200-200-20 MG/5ML PO SUSP: 30.0000 mL | ORAL | Status: DC | PRN

## 2023-09-12 MED ORDER — LACTATED RINGERS IV SOLN: INTRAVENOUS | Status: DC

## 2023-09-12 MED ORDER — PROPOFOL 10 MG/ML IV BOLUS
INTRAVENOUS | Status: DC | PRN
  Administered 2023-09-12: 30 mg via INTRAVENOUS
  Administered 2023-09-12: 50 mg via INTRAVENOUS

## 2023-09-12 MED ORDER — BUPIVACAINE-EPINEPHRINE (PF) 0.5% -1:200000 IJ SOLN
INTRAMUSCULAR | Status: AC
  Filled 2023-09-12: qty 30

## 2023-09-12 MED ORDER — ASPIRIN 81 MG PO CHEW
81.0000 mg | CHEWABLE_TABLET | Freq: Two times a day (BID) | ORAL | Status: DC
  Administered 2023-09-13 – 2023-09-16 (×7): 81 mg via ORAL
  Filled 2023-09-12 (×7): qty 1

## 2023-09-12 MED ORDER — METOCLOPRAMIDE HCL 5 MG/ML IJ SOLN: 5.0000 mg | Freq: Three times a day (TID) | INTRAMUSCULAR | Status: DC | PRN

## 2023-09-12 MED ORDER — MYCOPHENOLATE MOFETIL 250 MG PO CAPS
750.0000 mg | ORAL_CAPSULE | Freq: Two times a day (BID) | ORAL | Status: DC
Start: 2023-09-12 — End: 2023-09-16
  Administered 2023-09-12 – 2023-09-16 (×8): 750 mg via ORAL
  Filled 2023-09-12 (×8): qty 3

## 2023-09-12 MED ORDER — SODIUM CHLORIDE 0.9 % IR SOLN
Status: DC | PRN
  Administered 2023-09-12: 1000 mL

## 2023-09-12 MED ORDER — MIDAZOLAM HCL 2 MG/2ML IJ SOLN
1.0000 mg | INTRAMUSCULAR | Status: DC | PRN
  Administered 2023-09-12: 2 mg via INTRAVENOUS
  Filled 2023-09-12: qty 2

## 2023-09-12 MED ORDER — FENTANYL CITRATE (PF) 100 MCG/2ML IJ SOLN
INTRAMUSCULAR | Status: AC
  Filled 2023-09-12: qty 2

## 2023-09-12 MED ORDER — TRANEXAMIC ACID-NACL 1000-0.7 MG/100ML-% IV SOLN
1000.0000 mg | INTRAVENOUS | Status: AC
  Administered 2023-09-12: 1000 mg via INTRAVENOUS
  Filled 2023-09-12: qty 100

## 2023-09-12 MED ORDER — ORAL CARE MOUTH RINSE: 15.0000 mL | Freq: Once | OROMUCOSAL | Status: AC

## 2023-09-12 MED ORDER — MAGNESIUM OXIDE -MG SUPPLEMENT 400 (240 MG) MG PO TABS
400.0000 mg | ORAL_TABLET | Freq: Every day | ORAL | Status: DC
  Administered 2023-09-16: 400 mg via ORAL
  Filled 2023-09-12 (×2): qty 1

## 2023-09-12 MED ORDER — OXYCODONE HCL 5 MG PO TABS
5.0000 mg | ORAL_TABLET | Freq: Once | ORAL | Status: AC | PRN
  Administered 2023-09-12: 5 mg via ORAL

## 2023-09-12 MED ORDER — ONDANSETRON HCL 4 MG/2ML IJ SOLN: 4.0000 mg | Freq: Four times a day (QID) | INTRAMUSCULAR | Status: DC | PRN

## 2023-09-12 MED ORDER — PHENOL 1.4 % MT LIQD: 1.0000 | OROMUCOSAL | Status: DC | PRN

## 2023-09-12 MED ORDER — BUPIVACAINE IN DEXTROSE 0.75-8.25 % IT SOLN
INTRATHECAL | Status: DC | PRN
Start: 2023-09-12 — End: 2023-09-12
  Administered 2023-09-12: 1.8 mL via INTRATHECAL

## 2023-09-12 MED ORDER — CHLORHEXIDINE GLUCONATE 0.12 % MT SOLN
15.0000 mL | Freq: Once | OROMUCOSAL | Status: AC
  Administered 2023-09-12: 15 mL via OROMUCOSAL

## 2023-09-12 MED ORDER — DOCUSATE SODIUM 100 MG PO CAPS
100.0000 mg | ORAL_CAPSULE | Freq: Two times a day (BID) | ORAL | Status: DC
  Administered 2023-09-12 – 2023-09-16 (×8): 100 mg via ORAL
  Filled 2023-09-12 (×8): qty 1

## 2023-09-12 MED ORDER — SODIUM CHLORIDE (PF) 0.9 % IJ SOLN
INTRAMUSCULAR | Status: AC
Start: 2023-09-12 — End: 2023-09-12
  Filled 2023-09-12: qty 30

## 2023-09-12 MED ORDER — LACTATED RINGERS IV BOLUS
500.0000 mL | Freq: Once | INTRAVENOUS | Status: AC
  Administered 2023-09-12: 500 mL via INTRAVENOUS

## 2023-09-12 MED ORDER — CYCLOSPORINE MODIFIED (NEORAL) 25 MG PO CAPS
25.0000 mg | ORAL_CAPSULE | Freq: Two times a day (BID) | ORAL | Status: DC
  Administered 2023-09-12 – 2023-09-16 (×8): 25 mg via ORAL
  Filled 2023-09-12 (×9): qty 1

## 2023-09-12 MED ORDER — LACTATED RINGERS IV BOLUS: 250.0000 mL | Freq: Once | INTRAVENOUS | Status: DC

## 2023-09-12 MED ORDER — FENTANYL CITRATE PF 50 MCG/ML IJ SOSY
PREFILLED_SYRINGE | INTRAMUSCULAR | Status: AC
  Filled 2023-09-12: qty 2

## 2023-09-12 MED ORDER — PHENYLEPHRINE HCL (PRESSORS) 10 MG/ML IV SOLN
INTRAVENOUS | Status: AC
  Filled 2023-09-12: qty 1

## 2023-09-12 MED ORDER — CYCLOSPORINE MODIFIED (NEORAL) 100 MG PO CAPS
100.0000 mg | ORAL_CAPSULE | Freq: Two times a day (BID) | ORAL | Status: DC
Start: 2023-09-12 — End: 2023-09-16
  Administered 2023-09-12 – 2023-09-16 (×8): 100 mg via ORAL
  Filled 2023-09-12 (×9): qty 1

## 2023-09-12 MED ORDER — ONDANSETRON HCL 4 MG/2ML IJ SOLN
INTRAMUSCULAR | Status: DC | PRN
  Administered 2023-09-12: 4 mg via INTRAVENOUS

## 2023-09-12 MED ORDER — DOXYCYCLINE HYCLATE 100 MG PO TABS
100.0000 mg | ORAL_TABLET | Freq: Two times a day (BID) | ORAL | Status: DC
  Administered 2023-09-12 – 2023-09-16 (×8): 100 mg via ORAL
  Filled 2023-09-12 (×8): qty 1

## 2023-09-12 MED ORDER — TRANEXAMIC ACID 1000 MG/10ML IV SOLN
INTRAVENOUS | Status: DC | PRN
  Administered 2023-09-12: 2000 mg via TOPICAL

## 2023-09-12 MED ORDER — 0.9 % SODIUM CHLORIDE (POUR BTL) OPTIME
TOPICAL | Status: DC | PRN
  Administered 2023-09-12: 1000 mL

## 2023-09-12 MED ORDER — HYDROCODONE-ACETAMINOPHEN 5-325 MG PO TABS
1.0000 | ORAL_TABLET | ORAL | Status: DC | PRN
  Administered 2023-09-13 – 2023-09-15 (×5): 2 via ORAL
  Filled 2023-09-12 (×2): qty 2
  Filled 2023-09-12: qty 1
  Filled 2023-09-12 (×2): qty 2
  Filled 2023-09-12: qty 1

## 2023-09-12 MED ORDER — PHENYLEPHRINE 80 MCG/ML (10ML) SYRINGE FOR IV PUSH (FOR BLOOD PRESSURE SUPPORT)
PREFILLED_SYRINGE | INTRAVENOUS | Status: AC
  Filled 2023-09-12: qty 10

## 2023-09-12 MED ORDER — TRANEXAMIC ACID 1000 MG/10ML IV SOLN
2000.0000 mg | INTRAVENOUS | Status: DC
  Filled 2023-09-12: qty 20

## 2023-09-12 MED ORDER — CEFAZOLIN SODIUM-DEXTROSE 3-4 GM/150ML-% IV SOLN
3.0000 g | INTRAVENOUS | Status: AC
  Administered 2023-09-12: 3 g via INTRAVENOUS
  Filled 2023-09-12: qty 150

## 2023-09-12 MED ORDER — LOSARTAN POTASSIUM 50 MG PO TABS
50.0000 mg | ORAL_TABLET | Freq: Two times a day (BID) | ORAL | Status: DC
  Administered 2023-09-12 – 2023-09-16 (×8): 50 mg via ORAL
  Filled 2023-09-12 (×8): qty 1

## 2023-09-12 MED ORDER — ALLOPURINOL 100 MG PO TABS
100.0000 mg | ORAL_TABLET | Freq: Every day | ORAL | Status: DC
  Administered 2023-09-13 – 2023-09-16 (×4): 100 mg via ORAL
  Filled 2023-09-12 (×4): qty 1

## 2023-09-12 MED ORDER — SODIUM CHLORIDE 0.9 % IV SOLN: INTRAVENOUS | Status: DC | PRN

## 2023-09-12 MED ORDER — HYDROCHLOROTHIAZIDE 25 MG PO TABS
25.0000 mg | ORAL_TABLET | Freq: Every day | ORAL | Status: DC
  Administered 2023-09-12 – 2023-09-16 (×5): 25 mg via ORAL
  Filled 2023-09-12 (×5): qty 1

## 2023-09-12 MED ORDER — HYDROCODONE-ACETAMINOPHEN 5-325 MG PO TABS: 1.0000 | ORAL_TABLET | Freq: Four times a day (QID) | ORAL | 0 refills | Status: AC | PRN

## 2023-09-12 MED ORDER — PROPOFOL 10 MG/ML IV BOLUS
INTRAVENOUS | Status: AC
  Filled 2023-09-12: qty 20

## 2023-09-12 MED ORDER — ROPIVACAINE HCL 5 MG/ML IJ SOLN
INTRAMUSCULAR | Status: DC | PRN
  Administered 2023-09-12: 30 mL via PERINEURAL

## 2023-09-12 MED ORDER — FENTANYL CITRATE (PF) 100 MCG/2ML IJ SOLN
INTRAMUSCULAR | Status: DC | PRN
  Administered 2023-09-12: 100 ug via INTRAVENOUS

## 2023-09-12 MED ORDER — OXYCODONE HCL 5 MG/5ML PO SOLN: 5.0000 mg | Freq: Once | ORAL | Status: AC | PRN

## 2023-09-12 MED ORDER — PROPOFOL 500 MG/50ML IV EMUL
INTRAVENOUS | Status: DC | PRN
  Administered 2023-09-12: 80 ug/kg/min via INTRAVENOUS

## 2023-09-12 MED ORDER — ACETAMINOPHEN 500 MG PO TABS
1000.0000 mg | ORAL_TABLET | Freq: Once | ORAL | Status: AC
  Administered 2023-09-12: 1000 mg via ORAL
  Filled 2023-09-12: qty 2

## 2023-09-12 MED ORDER — FENTANYL CITRATE PF 50 MCG/ML IJ SOSY
25.0000 ug | PREFILLED_SYRINGE | INTRAMUSCULAR | Status: DC | PRN
  Administered 2023-09-12 (×2): 50 ug via INTRAVENOUS

## 2023-09-12 MED ORDER — PYRIDOXINE HCL 25 MG PO TABS
25.0000 mg | ORAL_TABLET | Freq: Every day | ORAL | Status: DC
  Administered 2023-09-13 – 2023-09-16 (×4): 25 mg via ORAL
  Filled 2023-09-12 (×4): qty 1

## 2023-09-12 MED ORDER — ONDANSETRON HCL 4 MG/2ML IJ SOLN
INTRAMUSCULAR | Status: AC
Start: 2023-09-12 — End: 2023-09-12
  Filled 2023-09-12: qty 2

## 2023-09-12 MED ORDER — FENTANYL CITRATE PF 50 MCG/ML IJ SOSY
50.0000 ug | PREFILLED_SYRINGE | INTRAMUSCULAR | Status: DC | PRN
  Filled 2023-09-12: qty 2

## 2023-09-12 MED ORDER — BUPIVACAINE LIPOSOME 1.3 % IJ SUSP
INTRAMUSCULAR | Status: AC
  Filled 2023-09-12: qty 20

## 2023-09-12 MED ORDER — DIPHENHYDRAMINE HCL 12.5 MG/5ML PO ELIX: 12.5000 mg | ORAL_SOLUTION | ORAL | Status: DC | PRN

## 2023-09-12 MED ORDER — AMISULPRIDE (ANTIEMETIC) 5 MG/2ML IV SOLN: 10.0000 mg | Freq: Once | INTRAVENOUS | Status: DC | PRN

## 2023-09-12 MED ORDER — SODIUM CHLORIDE 0.9 % IV SOLN
INTRAVENOUS | Status: DC | PRN
  Administered 2023-09-12: 80 mL

## 2023-09-12 MED ORDER — PREDNISONE 5 MG PO TABS
5.0000 mg | ORAL_TABLET | Freq: Every day | ORAL | Status: DC
  Administered 2023-09-13 – 2023-09-16 (×4): 5 mg via ORAL
  Filled 2023-09-12 (×4): qty 1

## 2023-09-12 MED ORDER — ACETAMINOPHEN 325 MG PO TABS
325.0000 mg | ORAL_TABLET | Freq: Four times a day (QID) | ORAL | Status: DC | PRN
  Administered 2023-09-16 (×2): 650 mg via ORAL
  Filled 2023-09-12 (×3): qty 2

## 2023-09-12 MED ORDER — HYDROCODONE-ACETAMINOPHEN 7.5-325 MG PO TABS
1.0000 | ORAL_TABLET | ORAL | Status: DC | PRN
  Administered 2023-09-12 – 2023-09-13 (×3): 2 via ORAL
  Administered 2023-09-13: 1 via ORAL
  Administered 2023-09-14 (×2): 2 via ORAL
  Administered 2023-09-14: 1 via ORAL
  Administered 2023-09-15: 2 via ORAL
  Filled 2023-09-12 (×2): qty 1
  Filled 2023-09-12 (×8): qty 2

## 2023-09-12 MED ORDER — ONDANSETRON HCL 4 MG PO TABS: 4.0000 mg | ORAL_TABLET | Freq: Four times a day (QID) | ORAL | Status: DC | PRN

## 2023-09-12 MED ORDER — BISACODYL 5 MG PO TBEC
5.0000 mg | DELAYED_RELEASE_TABLET | Freq: Every day | ORAL | Status: DC | PRN
  Administered 2023-09-15: 5 mg via ORAL
  Filled 2023-09-12: qty 1

## 2023-09-12 MED ORDER — PHENYLEPHRINE 80 MCG/ML (10ML) SYRINGE FOR IV PUSH (FOR BLOOD PRESSURE SUPPORT)
PREFILLED_SYRINGE | INTRAVENOUS | Status: DC | PRN
  Administered 2023-09-12: 160 ug via INTRAVENOUS

## 2023-09-12 MED ORDER — TRANEXAMIC ACID-NACL 1000-0.7 MG/100ML-% IV SOLN: 1000.0000 mg | Freq: Once | INTRAVENOUS | Status: DC

## 2023-09-12 MED ORDER — CEFAZOLIN SODIUM-DEXTROSE 2-4 GM/100ML-% IV SOLN
2.0000 g | Freq: Four times a day (QID) | INTRAVENOUS | Status: AC
  Administered 2023-09-12 – 2023-09-13 (×2): 2 g via INTRAVENOUS
  Filled 2023-09-12 (×2): qty 100

## 2023-09-12 MED ORDER — ACETAMINOPHEN 500 MG PO TABS
500.0000 mg | ORAL_TABLET | Freq: Four times a day (QID) | ORAL | Status: AC
  Administered 2023-09-13 (×2): 500 mg via ORAL
  Filled 2023-09-12 (×2): qty 1

## 2023-09-12 MED ORDER — METHOCARBAMOL 500 MG PO TABS
500.0000 mg | ORAL_TABLET | Freq: Four times a day (QID) | ORAL | Status: DC | PRN
  Administered 2023-09-13 – 2023-09-15 (×3): 500 mg via ORAL
  Filled 2023-09-12 (×3): qty 1

## 2023-09-12 MED ORDER — ACETAMINOPHEN 10 MG/ML IV SOLN: 1000.0000 mg | Freq: Once | INTRAVENOUS | Status: DC | PRN

## 2023-09-12 MED ORDER — POVIDONE-IODINE 10 % EX SWAB
2.0000 | Freq: Once | CUTANEOUS | Status: AC
  Administered 2023-09-12: 2 via TOPICAL

## 2023-09-12 MED ORDER — LORATADINE 10 MG PO TABS
10.0000 mg | ORAL_TABLET | Freq: Every day | ORAL | Status: DC
Start: 2023-09-13 — End: 2023-09-16
  Administered 2023-09-13 – 2023-09-16 (×4): 10 mg via ORAL
  Filled 2023-09-12 (×4): qty 1

## 2023-09-12 MED ORDER — METOCLOPRAMIDE HCL 5 MG PO TABS: 5.0000 mg | ORAL_TABLET | Freq: Three times a day (TID) | ORAL | Status: DC | PRN

## 2023-09-12 MED ORDER — METHOCARBAMOL 1000 MG/10ML IJ SOLN
500.0000 mg | Freq: Four times a day (QID) | INTRAMUSCULAR | Status: DC | PRN
  Administered 2023-09-12: 500 mg via INTRAVENOUS

## 2023-09-12 MED ORDER — MENTHOL 3 MG MT LOZG: 1.0000 | LOZENGE | OROMUCOSAL | Status: DC | PRN

## 2023-09-12 MED ORDER — OXYCODONE HCL 5 MG PO TABS
ORAL_TABLET | ORAL | Status: AC
  Filled 2023-09-12: qty 1

## 2023-09-12 MED ORDER — POVIDONE-IODINE 7.5 % EX SOLN: Freq: Once | CUTANEOUS | Status: DC

## 2023-09-12 MED ORDER — FENTANYL CITRATE PF 50 MCG/ML IJ SOSY
PREFILLED_SYRINGE | INTRAMUSCULAR | Status: AC
  Filled 2023-09-12: qty 1

## 2023-09-12 MED ORDER — METHOCARBAMOL 1000 MG/10ML IJ SOLN
INTRAMUSCULAR | Status: AC
  Filled 2023-09-12: qty 10

## 2023-09-12 MED ORDER — PROPOFOL 1000 MG/100ML IV EMUL
INTRAVENOUS | Status: AC
  Filled 2023-09-12: qty 100

## 2023-09-12 MED ORDER — MORPHINE SULFATE (PF) 2 MG/ML IV SOLN
0.5000 mg | INTRAVENOUS | Status: DC | PRN
  Administered 2023-09-13: 0.5 mg via INTRAVENOUS
  Administered 2023-09-13: 1 mg via INTRAVENOUS
  Filled 2023-09-12 (×2): qty 1

## 2023-09-12 MED ORDER — ASPIRIN 81 MG PO TBEC
81.0000 mg | DELAYED_RELEASE_TABLET | Freq: Two times a day (BID) | ORAL | 0 refills | Status: DC
Start: 2023-09-12 — End: 2023-12-25

## 2023-09-12 MED ORDER — MAGNESIUM OXIDE-PYRIDOXINE HCL 362-20 MG PO TABS: 1.0000 | ORAL_TABLET | Freq: Every day | ORAL | Status: DC

## 2023-09-12 MED ORDER — ONDANSETRON HCL 4 MG/2ML IJ SOLN
INTRAMUSCULAR | Status: AC
  Filled 2023-09-12: qty 2

## 2023-09-12 MED ORDER — TIZANIDINE HCL 4 MG PO TABS: 4.0000 mg | ORAL_TABLET | Freq: Four times a day (QID) | ORAL | 1 refills | Status: DC | PRN

## 2023-09-12 SURGICAL SUPPLY — 49 items
ATTUNE MED DOME PAT 41 KNEE (Knees) IMPLANT
ATTUNE PS FEM RT SZ 8 CEM KNEE (Femur) IMPLANT
ATTUNE PSRP INSR SZ8 6 KNEE (Insert) IMPLANT
BAG COUNTER SPONGE SURGICOUNT (BAG) ×2 IMPLANT
BAG DECANTER FOR FLEXI CONT (MISCELLANEOUS) ×2 IMPLANT
BAG ZIPLOCK 12X15 (MISCELLANEOUS) ×2 IMPLANT
BASE TIBIAL ROT PLAT SZ 10 KNE (Miscellaneous) IMPLANT
BLADE SAGITTAL 25.0X1.19X90 (BLADE) ×2 IMPLANT
BLADE SAW SGTL 11.0X1.19X90.0M (BLADE) ×2 IMPLANT
BLADE SURG SZ10 CARB STEEL (BLADE) ×2 IMPLANT
BNDG ELASTIC 6INX 5YD STR LF (GAUZE/BANDAGES/DRESSINGS) ×2 IMPLANT
BOOTIES KNEE HIGH SLOAN (MISCELLANEOUS) ×2 IMPLANT
BOWL SMART MIX CTS (DISPOSABLE) ×2 IMPLANT
CEMENT HV SMART SET (Cement) ×4 IMPLANT
COVER SURGICAL LIGHT HANDLE (MISCELLANEOUS) ×2 IMPLANT
CUFF TRNQT CYL 34X4.125X (TOURNIQUET CUFF) ×2 IMPLANT
DRAPE TOP 10253 STERILE (DRAPES) ×2 IMPLANT
DRAPE U-SHAPE 47X51 STRL (DRAPES) ×2 IMPLANT
DRSG AQUACEL AG ADV 3.5X10 (GAUZE/BANDAGES/DRESSINGS) ×2 IMPLANT
DURAPREP 26ML APPLICATOR (WOUND CARE) ×4 IMPLANT
ELECT PENCIL ROCKER SW 15FT (MISCELLANEOUS) ×2 IMPLANT
ELECT REM PT RETURN 15FT ADLT (MISCELLANEOUS) ×2 IMPLANT
GLOVE BIO SURGEON STRL SZ8 (GLOVE) ×4 IMPLANT
GLOVE BIOGEL PI IND STRL 7.0 (GLOVE) ×2 IMPLANT
GLOVE BIOGEL PI IND STRL 8 (GLOVE) ×4 IMPLANT
GLOVE SURG SYN 7.0 (GLOVE) ×1 IMPLANT
GLOVE SURG SYN 7.0 PF PI (GLOVE) ×2 IMPLANT
GOWN SRG XL LVL 4 BRTHBL STRL (GOWNS) ×2 IMPLANT
GOWN STRL REUS W/ TWL XL LVL3 (GOWN DISPOSABLE) ×4 IMPLANT
HOLDER FOLEY CATH W/STRAP (MISCELLANEOUS) IMPLANT
HOOD PEEL AWAY T7 (MISCELLANEOUS) ×6 IMPLANT
KIT TURNOVER KIT A (KITS) ×2 IMPLANT
MANIFOLD NEPTUNE II (INSTRUMENTS) ×2 IMPLANT
NS IRRIG 1000ML POUR BTL (IV SOLUTION) ×2 IMPLANT
PACK TOTAL KNEE CUSTOM (KITS) ×2 IMPLANT
PAD ARMBOARD POSITIONER FOAM (MISCELLANEOUS) ×2 IMPLANT
PIN STEINMAN FIXATION KNEE (PIN) IMPLANT
PROTECTOR NERVE ULNAR (MISCELLANEOUS) ×2 IMPLANT
SET HNDPC FAN SPRY TIP SCT (DISPOSABLE) ×2 IMPLANT
SPIKE FLUID TRANSFER (MISCELLANEOUS) ×4 IMPLANT
SUT ETHIBOND NAB CT1 #1 30IN (SUTURE) ×2 IMPLANT
SUT VIC AB 0 CT1 36 (SUTURE) ×4 IMPLANT
SUT VIC AB 2-0 CT1 TAPERPNT 27 (SUTURE) ×2 IMPLANT
SUT VICRYL AB 3-0 FS1 BRD 27IN (SUTURE) ×2 IMPLANT
SUTURE STRATFX 0 PDS 27 VIOLET (SUTURE) ×2 IMPLANT
TRAY FOLEY MTR SLVR 16FR STAT (SET/KITS/TRAYS/PACK) IMPLANT
WATER STERILE IRR 1000ML POUR (IV SOLUTION) ×4 IMPLANT
WRAP KNEE MAXI GEL POST OP (GAUZE/BANDAGES/DRESSINGS) ×2 IMPLANT
YANKAUER SUCT BULB TIP NO VENT (SUCTIONS) ×2 IMPLANT

## 2023-09-12 NOTE — Interval H&P Note (Signed)
 History and Physical Interval Note:  09/12/2023 12:00 PM  Seth Morales  has presented today for surgery, with the diagnosis of RIGHT KNEE DEGENERATIVE JOINT DISEASE.  The various methods of treatment have been discussed with the patient and family. After consideration of risks, benefits and other options for treatment, the patient has consented to  Procedure(s) with comments: ARTHROPLASTY, KNEE, TOTAL (Right) - TOTAL RIGHT KNEE ATHROPLASTY as a surgical intervention.  The patient's history has been reviewed, patient examined, no change in status, stable for surgery.  I have reviewed the patient's chart and labs.  Questions were answered to the patient's satisfaction.     Ileah Falkenstein G Mishayla Sliwinski

## 2023-09-12 NOTE — Anesthesia Postprocedure Evaluation (Signed)
 Anesthesia Post Note  Patient: Seth Morales  Procedure(s) Performed: ARTHROPLASTY, KNEE, TOTAL (Right: Knee)     Patient location during evaluation: PACU Anesthesia Type: Regional and Spinal Level of consciousness: awake Pain management: pain level controlled Vital Signs Assessment: post-procedure vital signs reviewed and stable Respiratory status: spontaneous breathing, nonlabored ventilation and respiratory function stable Cardiovascular status: blood pressure returned to baseline and stable Postop Assessment: no apparent nausea or vomiting Anesthetic complications: no   No notable events documented.  Last Vitals:  Vitals:   09/12/23 1715 09/12/23 1730  BP: (!) 179/79 (!) 151/81  Pulse: (!) 51 62  Resp: 16 12  Temp:    SpO2: 100% 96%    Last Pain:  Vitals:   09/12/23 1730  TempSrc:   PainSc: Asleep                 Jorge Amparo P Dakoda Bassette

## 2023-09-12 NOTE — Transfer of Care (Signed)
 Immediate Anesthesia Transfer of Care Note  Patient: Seth Morales  Procedure(s) Performed: ARTHROPLASTY, KNEE, TOTAL (Right: Knee)  Patient Location: PACU  Anesthesia Type:Spinal and MAC combined with regional for post-op pain  Level of Consciousness: awake, alert , and oriented  Airway & Oxygen Therapy: Patient Spontanous Breathing and Patient connected to nasal cannula oxygen  Post-op Assessment: Report given to RN and Post -op Vital signs reviewed and stable  Post vital signs: Reviewed and stable  Last Vitals:  Vitals Value Taken Time  BP    Temp    Pulse    Resp    SpO2      Last Pain:  Vitals:   09/12/23 1310  TempSrc:   PainSc: 0-No pain      Patients Stated Pain Goal: 4 (09/12/23 1159)  Complications: No notable events documented.

## 2023-09-12 NOTE — Anesthesia Procedure Notes (Signed)
 Date/Time: 09/12/2023 1:30 PM  Performed by: Landy Chip HERO, CRNAOxygen Delivery Method: Simple face mask Placement Confirmation: positive ETCO2 Dental Injury: Teeth and Oropharynx as per pre-operative assessment

## 2023-09-12 NOTE — Anesthesia Preprocedure Evaluation (Addendum)
 Anesthesia Evaluation  Patient identified by MRN, date of birth, ID band Patient awake    Reviewed: Allergy & Precautions, NPO status , Patient's Chart, lab work & pertinent test results  Airway Mallampati: II  TM Distance: >3 FB Neck ROM: Full    Dental no notable dental hx.    Pulmonary neg pulmonary ROS   Pulmonary exam normal        Cardiovascular hypertension, Pt. on medications Normal cardiovascular exam     Neuro/Psych negative neurological ROS     GI/Hepatic negative GI ROS, Neg liver ROS,,,  Endo/Other  negative endocrine ROS    Renal/GU CRFRenal disease     Musculoskeletal  (+) Arthritis ,    Abdominal  (+) + obese  Peds  Hematology negative hematology ROS (+) PLT: 208k   Anesthesia Other Findings RIGHT KNEE DEGENERATIVE JOINT DISEASE  Reproductive/Obstetrics                              Anesthesia Physical Anesthesia Plan  ASA: 2  Anesthesia Plan: Regional and Spinal   Post-op Pain Management: Regional block*   Induction:   PONV Risk Score and Plan: 1 and Ondansetron , Dexamethasone, Propofol  infusion, Midazolam  and Treatment may vary due to age or medical condition  Airway Management Planned: Simple Face Mask  Additional Equipment:   Intra-op Plan:   Post-operative Plan:   Informed Consent: I have reviewed the patients History and Physical, chart, labs and discussed the procedure including the risks, benefits and alternatives for the proposed anesthesia with the patient or authorized representative who has indicated his/her understanding and acceptance.     Dental advisory given  Plan Discussed with: CRNA  Anesthesia Plan Comments:         Anesthesia Quick Evaluation

## 2023-09-12 NOTE — Op Note (Signed)
 PREOP DIAGNOSIS: DJD RIGHT KNEE POSTOP DIAGNOSIS: same PROCEDURE: RIGHT TKR ANESTHESIA: Spinal and MAC ATTENDING SURGEON: Maude KANDICE Herald ASSISTANT: Prentice Earl PA  INDICATIONS FOR PROCEDURE: Seth Morales is a 63 y.o. male who has struggled for a long time with pain due to degenerative arthritis of the right knee.  The patient has failed many conservative non-operative measures and at this point has pain which limits the ability to sleep and walk.  The patient is offered total knee replacement.  Informed operative consent was obtained after discussion of possible risks of anesthesia, infection, neurovascular injury, DVT, and death.  The importance of the post-operative rehabilitation protocol to optimize result was stressed extensively with the patient.  SUMMARY OF FINDINGS AND PROCEDURE:  Seth Morales was taken to the operative suite where under the above anesthesia a right knee replacement was performed.  There were advanced degenerative changes and the bone quality was excellent.  We used the DePuy Attune system and placed size 8 femur, 10 tibia, 41 mm all polyethylene patella, and a size 6 mm spacer.  Prentice Earl PA-C assisted throughout and was invaluable to the completion of the case in that he helped retract and maintain exposure while I placed components.  He also helped close thereby minimizing OR time.  The patient was admitted for appropriate post-op care to include perioperative antibiotics and mechanical and pharmacologic measures for DVT prophylaxis.  DESCRIPTION OF PROCEDURE:  Seth Morales was taken to the operative suite where the above anesthesia was applied.  The patient was positioned supine and prepped and draped in normal sterile fashion.  An appropriate time out was performed.  After the administration of kefzol  pre-op antibiotic the leg was elevated and exsanguinated and a tourniquet inflated. A standard longitudinal incision was made on the anterior knee.  Dissection was  carried down to the extensor mechanism.  All appropriate anti-infective measures were used including the pre-operative antibiotic, betadine  impregnated drape, and closed hooded exhaust systems for each member of the surgical team.  A medial parapatellar incision was made in the extensor mechanism and the knee cap flipped and the knee flexed.  Some residual meniscal tissues were removed along with any remaining ACL/PCL tissue.  A guide was placed on the tibia and a flat cut was made on it's superior surface.  An intramedullary guide was placed in the femur and was utilized to make anterior and posterior cuts creating an appropriate flexion gap.  A second intramedullary guide was placed in the femur to make a distal cut properly balancing the knee with an extension gap equal to the flexion gap.  The three bones sized to the above mentioned sizes and the appropriate guides were placed and utilized.  A trial reduction was done and the knee easily came to full extension and the patella tracked well on flexion.  The trial components were removed and all bones were cleaned with pulsatile lavage and then dried thoroughly.  Cement was mixed and was pressurized onto the bones followed by placement of the aforementioned components.  Excess cement was trimmed and pressure was held on the components until the cement had hardened.  The tourniquet was deflated and a small amount of bleeding was controlled with cautery and pressure.  The knee was irrigated thoroughly.  The extensor mechanism was re-approximated with #1 ethibond in interrupted fashion.  The knee was flexed and the repair was solid.  The subcutaneous tissues were re-approximated with #0 and #2-0 vicryl and the skin closed with  a subcuticular stitch and steristrips.  A sterile dressing was applied.  Intraoperative fluids, EBL, and tourniquet time can be obtained from anesthesia records.  DISPOSITION:  The patient was taken to recovery room in stable condition and  scheduled to potentially go home same day depending on ability to walk and tolerate liquids..  Brysun Eschmann G Drea Jurewicz 09/12/2023, 2:58 PM

## 2023-09-12 NOTE — Anesthesia Procedure Notes (Signed)
 Procedure Name: MAC Date/Time: 09/12/2023 1:22 PM  Performed by: Landy Chip HERO, CRNAPre-anesthesia Checklist: Patient identified, Emergency Drugs available, Suction available, Patient being monitored and Timeout performed Patient Re-evaluated:Patient Re-evaluated prior to induction Oxygen Delivery Method: Nasal cannula Preoxygenation: Pre-oxygenation with 100% oxygen Induction Type: IV induction Placement Confirmation: positive ETCO2 Dental Injury: Teeth and Oropharynx as per pre-operative assessment

## 2023-09-12 NOTE — Anesthesia Procedure Notes (Signed)
 Spinal  Patient location during procedure: OR Start time: 09/12/2023 1:25 PM End time: 09/12/2023 1:30 PM Reason for block: surgical anesthesia Staffing Performed: anesthesiologist  Anesthesiologist: Patrisha Bernardino SQUIBB, MD Performed by: Patrisha Bernardino SQUIBB, MD Authorized by: Patrisha Bernardino SQUIBB, MD   Preanesthetic Checklist Completed: patient identified, IV checked, risks and benefits discussed, surgical consent, monitors and equipment checked, pre-op evaluation and timeout performed Spinal Block Patient position: sitting Prep: DuraPrep Patient monitoring: cardiac monitor, continuous pulse ox and blood pressure Approach: midline Location: L4-5 Injection technique: single-shot Needle Needle type: Pencan  Needle gauge: 24 G Needle length: 9 cm Assessment Sensory level: T10 Events: CSF return Additional Notes Functioning IV was confirmed and monitors were applied. Sterile prep and drape, including hand hygiene and sterile gloves were used. The patient was positioned and the spine was prepped. The skin was anesthetized with lidocaine .  Free flow of clear CSF was obtained prior to injecting local anesthetic into the CSF.  The spinal needle aspirated freely following injection.  The needle was carefully withdrawn.  The patient tolerated the procedure well.

## 2023-09-12 NOTE — Anesthesia Procedure Notes (Signed)
 Anesthesia Regional Block: Adductor canal block   Pre-Anesthetic Checklist: , timeout performed,  Correct Patient, Correct Site, Correct Laterality,  Correct Procedure,, site marked,  Risks and benefits discussed,  Surgical consent,  Pre-op evaluation,  At surgeon's request and post-op pain management  Laterality: Right  Prep: chloraprep       Needles:  Injection technique: Single-shot  Needle Type: Echogenic Stimulator Needle     Needle Length: 9cm  Needle Gauge: 21     Additional Needles:   Procedures:,,,, ultrasound used (permanent image in chart),,    Narrative:  Start time: 09/12/2023 12:55 PM End time: 09/12/2023 1:05 PM Injection made incrementally with aspirations every 5 mL.  Performed by: Personally  Anesthesiologist: Patrisha Bernardino SQUIBB, MD  Additional Notes: Functioning IV was confirmed and monitors were applied. A time-out was performed. Hand hygiene and sterile gloves were used. The thigh was placed in a frog-leg position and prepped in a sterile fashion. A 90mm 21ga Arrow echogenic stimulator needle was placed using ultrasound guidance.  Negative aspiration and negative test dose prior to incremental administration of local anesthetic. The patient tolerated the procedure well.

## 2023-09-13 MED ORDER — LACTATED RINGERS IV BOLUS: 500.0000 mL | Freq: Once | INTRAVENOUS | Status: DC

## 2023-09-13 NOTE — Evaluation (Signed)
 Physical Therapy Evaluation Patient Details Name: Seth Morales MRN: [id number] DOB: 09-25-1960 Today's Date: 09/13/2023  History of Present Illness  63 yo male presents to therapy s/p R TKA on 09/12/2023 due to failure of conservative measures. Pt PMH includes but is not limited to:  B LE edema, DOE, HLD, depression, gout, anemia, LBP, CKD, HTN, and polycystic kidney dz s/p transplant.  Clinical Impression  Pt is s/p TKA resulting in the deficits listed below (see PT Problem List). Pt is POD #1 s/p R TKA.  At baseline, he is independent and will have support at d/c.  Pt limited at evaluation due to pain and orthostatic hypotension.  Pt needing increased time for transfers/gait due to pain and then with ambulation had decreased BP that resolved with return to bed.  Pt needs further therapy prior to return home.  Pt will benefit from acute skilled PT to increase their independence and safety with mobility to allow discharge.          If plan is discharge home, recommend the following: A lot of help with walking and/or transfers;A lot of help with bathing/dressing/bathroom   Can travel by private vehicle        Equipment Recommendations Rolling walker (2 wheels)  Recommendations for Other Services       Functional Status Assessment Patient has had a recent decline in their functional status and demonstrates the ability to make significant improvements in function in a reasonable and predictable amount of time.     Precautions / Restrictions Precautions Precautions: Fall;Knee Restrictions Weight Bearing Restrictions Per Provider Order: Yes RLE Weight Bearing Per Provider Order: Weight bearing as tolerated      Mobility  Bed Mobility Overal bed mobility: Needs Assistance Bed Mobility: Supine to Sit     Supine to sit: Min assist, HOB elevated, Used rails     General bed mobility comments: Increased time, min A for leg, slow transition due to pain    Transfers Overall transfer  level: Needs assistance Equipment used: Rolling walker (2 wheels) Transfers: Sit to/from Stand Sit to Stand: Min assist, From elevated surface           General transfer comment: Bed elevated (pt is 6'6);  Stood 3 x with RW and light min A; cues for hand palcement    Ambulation/Gait Ambulation/Gait assistance: Min Chemical engineer (Feet): 6 Feet Assistive device: Rolling walker (2 wheels) Gait Pattern/deviations: Step-to pattern, Decreased stride length, Decreased weight shift to right Gait velocity: decreased     General Gait Details: Pt sat EOB at least 8 mins and stood 3 x prior to attempting walking.  Pt felt well until he took a few steps then became lightheaded and was returned to bed.  Pt remained alert but reports feeling lightheaded and was diaphoretic.  BP was 81/64 with return to EOB , returned to supine and up to 108/65.  Pt reports symptoms improved once sitting.  RN came into room during testing and aware of low BP and notified surgeon.  Stairs            Wheelchair Mobility     Tilt Bed    Modified Rankin (Stroke Patients Only)       Balance Overall balance assessment: Needs assistance Sitting-balance support: No upper extremity supported Sitting balance-Leahy Scale: Good     Standing balance support: Bilateral upper extremity supported, Reliant on assistive device for balance Standing balance-Leahy Scale: Poor  Pertinent Vitals/Pain Pain Assessment Pain Assessment: 0-10 Pain Score: 2  (2/10 rest but 7/10 transfers) Pain Location: R knee Pain Descriptors / Indicators: Discomfort, Guarding, Grimacing Pain Intervention(s): Limited activity within patient's tolerance, Monitored during session, Premedicated before session, Repositioned, Ice applied    Home Living Family/patient expects to be discharged to:: Private residence Living Arrangements: Spouse/significant other Available Help at Discharge:  Family;Available 24 hours/day Type of Home: House Home Access: Stairs to enter Entrance Stairs-Rails: None Entrance Stairs-Number of Steps: 1   Home Layout: One level;Able to live on main level with bedroom/bathroom Home Equipment: None Additional Comments: rented lift chair    Prior Function Prior Level of Function : Independent/Modified Independent;Working/employed;Driving                     Extremity/Trunk Assessment   Upper Extremity Assessment Upper Extremity Assessment: Overall WFL for tasks assessed    Lower Extremity Assessment Lower Extremity Assessment: LLE deficits/detail;RLE deficits/detail RLE Deficits / Details: Expected post op changes; ROM : knee 5 to 70 degrees; MMT: ankle 5/5, knee 2/5, hip 2/5 (limited by knee pain) LLE Deficits / Details: ROM WFL; MMT 5/5    Cervical / Trunk Assessment Cervical / Trunk Assessment: Normal  Communication        Cognition Arousal: Alert Behavior During Therapy: WFL for tasks assessed/performed   PT - Cognitive impairments: No apparent impairments                                 Cueing       General Comments      Exercises Total Joint Exercises Ankle Circles/Pumps: AROM, Both, 10 reps, Supine Quad Sets: AROM, Both, 10 reps, Supine Long Arc Quad: AAROM, Right, 10 reps, Seated Knee Flexion: AAROM, Right, 10 reps, Seated   Assessment/Plan    PT Assessment Patient needs continued PT services  PT Problem List Decreased strength;Decreased range of motion;Cardiopulmonary status limiting activity;Pain;Decreased activity tolerance;Decreased knowledge of use of DME;Decreased mobility;Decreased balance       PT Treatment Interventions DME instruction;Therapeutic exercise;Gait training;Stair training;Functional mobility training;Therapeutic activities;Patient/family education;Modalities;Balance training    PT Goals (Current goals can be found in the Care Plan section)  Acute Rehab PT Goals Patient  Stated Goal: return home PT Goal Formulation: With patient Time For Goal Achievement: 09/27/23 Potential to Achieve Goals: Good    Frequency 7X/week     Co-evaluation               AM-PAC PT 6 Clicks Mobility  Outcome Measure Help needed turning from your back to your side while in a flat bed without using bedrails?: A Little Help needed moving from lying on your back to sitting on the side of a flat bed without using bedrails?: A Little Help needed moving to and from a bed to a chair (including a wheelchair)?: A Little Help needed standing up from a chair using your arms (e.g., wheelchair or bedside chair)?: A Little Help needed to walk in hospital room?: Total Help needed climbing 3-5 steps with a railing? : Total 6 Click Score: 14    End of Session Equipment Utilized During Treatment: Gait belt Activity Tolerance: Treatment limited secondary to medical complications (Comment) Patient left: in bed;with call bell/phone within reach;with bed alarm set Nurse Communication: Mobility status;Other (comment) (orthostatic) PT Visit Diagnosis: Other abnormalities of gait and mobility (R26.89);Muscle weakness (generalized) (M62.81)    Time: 8963-8881 PT Time Calculation (min) (  ACUTE ONLY): 42 min   Charges:   PT Evaluation $PT Eval Low Complexity: 1 Low PT Treatments $Therapeutic Exercise: 8-22 mins $Therapeutic Activity: 8-22 mins PT General Charges $$ ACUTE PT VISIT: 1 Visit         Benjiman, PT Acute Rehab [facility] [phone]   Benjiman VEAR Mulberry 09/13/2023, 12:42 PM

## 2023-09-13 NOTE — TOC Transition Note (Signed)
 Transition of Care [facility]) - Discharge Note   Patient Details  Name: Seth Morales MRN: [id number] Date of Birth: August 29, 1960  [facility] CM/SW Contact:  NORMAN ASPEN, LCSW Phone Number: 09/13/2023, 9:43 AM   Clinical Narrative:     Met with pt who confirms need for a RW.  No DME agency preference.  Order placed with [employer] to be delivered to room.  OPPT already arranged with ortho office PT.  No further TOC needs.  Final next level of care: OP Rehab Barriers to Discharge: No Barriers Identified   Patient Goals and CMS Choice Patient states their goals for this hospitalization and ongoing recovery are:: return home          Discharge Placement                       Discharge Plan and Services Additional resources added to the After Visit Summary for                  DME Arranged: Walker rolling DME Agency: [employer] Date DME Agency Contacted: 09/13/23 Time DME Agency Contacted: 0930 Representative spoke with at DME Agency: Kaitlyn            Social Drivers of Health (SDOH) Interventions SDOH Screenings   Food Insecurity: No Food Insecurity (09/12/2023)  Housing: Low Risk  (09/12/2023)  Transportation Needs: No Transportation Needs (09/12/2023)  Utilities: Not At Risk (09/12/2023)  Depression (PHQ2-9): Low Risk  (03/05/2020)  Tobacco Use: Low Risk  (09/12/2023)     Readmission Risk Interventions     No data to display

## 2023-09-13 NOTE — Discharge Summary (Addendum)
 Patient ID: Seth Morales MRN: [id number] DOB/AGE: 1960/05/28 63 y.o.  Admit date: 09/12/2023 Discharge date: 09/15/2023  Admission Diagnoses:  Principal Problem:   S/P total knee arthroplasty, right   Discharge Diagnoses:  Same  Past Medical History:  Diagnosis Date   Allergy    Anemia    Arthritis    Back pain    Bilateral swelling of feet    Blood transfusion without reported diagnosis 13 years ago    Chronic kidney disease    Dermatitis    History of kidney stones    Hypertension    Joint pain    Kidney transplanted    Polycystic kidney disease    Had transplant   Skin cancer    Vitamin D  deficiency     Surgeries: Procedure(s): ARTHROPLASTY, KNEE, TOTAL on 09/12/2023   Consultants:   Discharged Condition: Improved  Hospital Course: RANGEL ECHEVERRI is an 63 y.o. male who was admitted 09/12/2023 for operative treatment ofS/P total knee arthroplasty, right. Patient has severe unremitting pain that affects sleep, daily activities, and work/hobbies. After pre-op clearance the patient was taken to the operating room on 09/12/2023 and underwent  Procedure(s): ARTHROPLASTY, KNEE, TOTAL.    Patient was given perioperative antibiotics:  Anti-infectives (From admission, onward)    Start     Dose/Rate Route Frequency Ordered Stop   09/12/23 2200  doxycycline  (VIBRA -TABS) tablet 100 mg        100 mg Oral 2 times daily 09/12/23 1756     09/12/23 2000  ceFAZolin  (ANCEF ) IVPB 2g/100 mL premix        2 g 200 mL/hr over 30 Minutes Intravenous Every 6 hours 09/12/23 1544 09/13/23 0310   09/12/23 1115  ceFAZolin  (ANCEF ) IVPB 3g/150 mL premix        3 g 300 mL/hr over 30 Minutes Intravenous On call to O.R. 09/12/23 1106 09/12/23 1330        Patient was given sequential compression devices, early ambulation, and chemoprophylaxis to prevent DVT.  Patient benefited maximally from hospital stay and there were no complications.    Recent vital signs: Patient Vitals for the past 24  hrs:  BP Temp Temp src Pulse Resp SpO2 Height Weight  09/13/23 0802 (!) 140/79 -- -- 83 -- 100 % -- --  09/13/23 0457 132/87 98.6 F (37 C) -- 82 18 100 % -- --  09/13/23 0113 (!) 140/80 98.5 F (36.9 C) -- 63 17 97 % -- --  09/12/23 2223 (!) 143/86 98.7 F (37.1 C) -- 95 17 97 % -- --  09/12/23 1859 (!) 150/89 98.2 F (36.8 C) -- 84 16 100 % -- --  09/12/23 1815 (!) 154/93 -- -- (!) 53 12 100 % -- --  09/12/23 1800 (!) 149/93 98.2 F (36.8 C) -- 60 12 100 % -- --  09/12/23 1745 (!) 159/85 -- -- (!) 57 16 100 % -- --  09/12/23 1730 (!) 151/81 -- -- 62 12 96 % -- --  09/12/23 1715 (!) 179/79 -- -- (!) 51 16 100 % -- --  09/12/23 1700 134/71 -- -- (!) 49 12 100 % -- --  09/12/23 1645 124/78 -- -- (!) 50 11 100 % -- --  09/12/23 1630 117/69 -- -- 62 13 97 % -- --  09/12/23 1615 122/75 -- -- (!) 51 10 99 % -- --  09/12/23 1600 124/82 -- -- (!) 54 (!) 7 100 % -- --  09/12/23 1546 123/78 98 F (36.7 C) -- (!)  56 12 96 % -- --  09/12/23 1310 -- -- -- 71 16 96 % -- --  09/12/23 1305 (!) 153/102 -- -- 75 19 95 % -- --  09/12/23 1300 (!) 158/96 -- -- 74 16 99 % -- --  09/12/23 1255 (!) 160/83 -- -- 70 15 97 % -- --  09/12/23 1159 -- -- -- -- -- -- 6' 5 (1.956 m) 124.1 kg  09/12/23 1122 (!) 153/93 97.9 F (36.6 C) Oral 86 16 94 % -- --     Recent laboratory studies: No results for input(s): WBC, HGB, HCT, PLT, NA, K, CL, CO2, BUN, CREATININE, GLUCOSE, INR, CALCIUM in the last 72 hours.  Invalid input(s): PT, 2   Discharge Medications:   Allergies as of 09/13/2023   No Known Allergies      Medication List     TAKE these medications    allopurinol  100 MG tablet Commonly known as: ZYLOPRIM  Take 100 mg by mouth daily.   aspirin  EC 81 MG tablet Take 1 tablet (81 mg total) by mouth 2 (two) times daily. For 2 weeks then once a day for 2 weeks for dvt prevention.   cetirizine 10 MG tablet Commonly known as: ZYRTEC Take 10 mg by mouth daily.    cycloSPORINE  modified 100 MG capsule Commonly known as: NEORAL  Take 100 mg by mouth 2 (two) times daily.   cycloSPORINE  modified 25 MG capsule Commonly known as: NEORAL  Take 25 mg by mouth 2 (two) times daily.   desonide 0.05 % cream Commonly known as: DESOWEN Apply 1 Application topically 2 (two) times daily.   doxycycline  100 MG capsule Commonly known as: VIBRAMYCIN  Take 100 mg by mouth 2 (two) times daily.   FISH OIL PO Take 1 capsule by mouth daily.   hydrochlorothiazide  25 MG tablet Commonly known as: HYDRODIURIL  Take 25 mg by mouth daily.   HYDROcodone -acetaminophen  5-325 MG tablet Commonly known as: NORCO/VICODIN Take 1-2 tablets by mouth every 6 (six) hours as needed for moderate pain (pain score 4-6) or severe pain (pain score 7-10) (post op pain).   losartan  50 MG tablet Commonly known as: COZAAR  Take 50 mg by mouth 2 (two) times daily.   magnesium  oxide-pyridoxine  362-20 MG Tabs tablet Commonly known as: BEELITH Take 1 tablet by mouth daily.   Multi-Vitamin tablet Take 1 tablet by mouth daily.   mycophenolate  250 MG capsule Commonly known as: CELLCEPT  Take 750 mg by mouth 2 (two) times daily.   predniSONE  5 MG tablet Commonly known as: DELTASONE  Take 5 mg by mouth daily with breakfast.   tiZANidine  4 MG tablet Commonly known as: Zanaflex  Take 1 tablet (4 mg total) by mouth every 6 (six) hours as needed for muscle spasms.   Vitamin D  125 MCG (5000 UT) Caps Take 5,000 Units by mouth daily.               Durable Medical Equipment  (From admission, onward)           Start     Ordered   09/12/23 1756  DME Walker rolling  Once       Question:  Patient needs a walker to treat with the following condition  Answer:  Primary osteoarthritis of right knee   09/12/23 1756   09/12/23 1756  DME 3 n 1  Once        09/12/23 1756   09/12/23 1756  DME Bedside commode  Once       Question:  Patient needs  a bedside commode to treat with the following  condition  Answer:  Primary osteoarthritis of right knee   09/12/23 1756            Diagnostic Studies: No results found.  Disposition: Discharge disposition: 01-Home or Self Care       Discharge Instructions     Call MD / Call 911   Complete by: As directed    If you experience chest pain or shortness of breath, CALL 911 and be transported to the hospital emergency room.  If you develope a fever above 101 F, pus (white drainage) or increased drainage or redness at the wound, or calf pain, call your surgeon's office.   Call MD / Call 911   Complete by: As directed    If you experience chest pain or shortness of breath, CALL 911 and be transported to the hospital emergency room.  If you develope a fever above 101 F, pus (white drainage) or increased drainage or redness at the wound, or calf pain, call your surgeon's office.   Constipation Prevention   Complete by: As directed    Drink plenty of fluids.  Prune juice may be helpful.  You may use a stool softener, such as Colace (over the counter) 100 mg twice a day.  Use MiraLax (over the counter) for constipation as needed.   Constipation Prevention   Complete by: As directed    Drink plenty of fluids.  Prune juice may be helpful.  You may use a stool softener, such as Colace (over the counter) 100 mg twice a day.  Use MiraLax (over the counter) for constipation as needed.   Diet - low sodium heart healthy   Complete by: As directed    Diet - low sodium heart healthy   Complete by: As directed    Discharge instructions   Complete by: As directed    INSTRUCTIONS AFTER JOINT REPLACEMENT   Remove items at home which could result in a fall. This includes throw rugs or furniture in walking pathways ICE to the affected joint every three hours while awake for 30 minutes at a time, for at least the first 3-5 days, and then as needed for pain and swelling.  Continue to use ice for pain and swelling. You may notice swelling that will  progress down to the foot and ankle.  This is normal after surgery.  Elevate your leg when you are not up walking on it.   Continue to use the breathing machine you got in the hospital (incentive spirometer) which will help keep your temperature down.  It is common for your temperature to cycle up and down following surgery, especially at night when you are not up moving around and exerting yourself.  The breathing machine keeps your lungs expanded and your temperature down.   DIET:  As you were doing prior to hospitalization, we recommend a well-balanced diet.  DRESSING / WOUND CARE / SHOWERING  You may shower 3 days after surgery, but keep the wounds dry during showering.  You may use an occlusive plastic wrap (Press'n Seal for example), NO SOAKING/SUBMERGING IN THE BATHTUB.  If the bandage gets wet, change with a clean dry gauze.  If the incision gets wet, pat the wound dry with a clean towel.  ACTIVITY  Increase activity slowly as tolerated, but follow the weight bearing instructions below.   No driving for 6 weeks or until further direction given by your physician.  You cannot drive while taking  narcotics.  No lifting or carrying greater than 10 lbs. until further directed by your surgeon. Avoid periods of inactivity such as sitting longer than an hour when not asleep. This helps prevent blood clots.  You may return to work once you are authorized by your doctor.     WEIGHT BEARING   Weight bearing as tolerated with assist device (walker, cane, etc) as directed, use it as long as suggested by your surgeon or therapist, typically at least 4-6 weeks.   EXERCISES  Results after joint replacement surgery are often greatly improved when you follow the exercise, range of motion and muscle strengthening exercises prescribed by your doctor. Safety measures are also important to protect the joint from further injury. Any time any of these exercises cause you to have increased pain or swelling,  decrease what you are doing until you are comfortable again and then slowly increase them. If you have problems or questions, call your caregiver or physical therapist for advice.   Rehabilitation is important following a joint replacement. After just a few days of immobilization, the muscles of the leg can become weakened and shrink (atrophy).  These exercises are designed to build up the tone and strength of the thigh and leg muscles and to improve motion. Often times heat used for twenty to thirty minutes before working out will loosen up your tissues and help with improving the range of motion but do not use heat for the first two weeks following surgery (sometimes heat can increase post-operative swelling).   These exercises can be done on a training (exercise) mat, on the floor, on a table or on a bed. Use whatever works the best and is most comfortable for you.    Use music or television while you are exercising so that the exercises are a pleasant break in your day. This will make your life better with the exercises acting as a break in your routine that you can look forward to.   Perform all exercises about fifteen times, three times per day or as directed.  You should exercise both the operative leg and the other leg as well.  Exercises include:   Quad Sets - Tighten up the muscle on the front of the thigh (Quad) and hold for 5-10 seconds.   Straight Leg Raises - With your knee straight (if you were given a brace, keep it on), lift the leg to 60 degrees, hold for 3 seconds, and slowly lower the leg.  Perform this exercise against resistance later as your leg gets stronger.  Leg Slides: Lying on your back, slowly slide your foot toward your buttocks, bending your knee up off the floor (only go as far as is comfortable). Then slowly slide your foot back down until your leg is flat on the floor again.  Angel Wings: Lying on your back spread your legs to the side as far apart as you can without  causing discomfort.  Hamstring Strength:  Lying on your back, push your heel against the floor with your leg straight by tightening up the muscles of your buttocks.  Repeat, but this time bend your knee to a comfortable angle, and push your heel against the floor.  You may put a pillow under the heel to make it more comfortable if necessary.   A rehabilitation program following joint replacement surgery can speed recovery and prevent re-injury in the future due to weakened muscles. Contact your doctor or a physical therapist for more information on knee rehabilitation.  CONSTIPATION  Constipation is defined medically as fewer than three stools per week and severe constipation as less than one stool per week.  Even if you have a regular bowel pattern at home, your normal regimen is likely to be disrupted due to multiple reasons following surgery.  Combination of anesthesia, postoperative narcotics, change in appetite and fluid intake all can affect your bowels.   YOU MUST use at least one of the following options; they are listed in order of increasing strength to get the job done.  They are all available over the counter, and you may need to use some, POSSIBLY even all of these options:    Drink plenty of fluids (prune juice may be helpful) and high fiber foods Colace 100 mg by mouth twice a day  Senokot for constipation as directed and as needed Dulcolax (bisacodyl ), take with full glass of water  Miralax (polyethylene glycol) once or twice a day as needed.  If you have tried all these things and are unable to have a bowel movement in the first 3-4 days after surgery call either your surgeon or your primary doctor.    If you experience loose stools or diarrhea, hold the medications until you stool forms back up.  If your symptoms do not get better within 1 week or if they get worse, check with your doctor.  If you experience the worst abdominal pain ever or develop nausea or vomiting, please  contact the office immediately for further recommendations for treatment.   ITCHING:  If you experience itching with your medications, try taking only a single pain pill, or even half a pain pill at a time.  You can also use Benadryl  over the counter for itching or also to help with sleep.   TED HOSE STOCKINGS:  Use stockings on both legs until for at least 2 weeks or as directed by physician office. They may be removed at night for sleeping.  MEDICATIONS:  See your medication summary on the After Visit Summary that nursing will review with you.  You may have some home medications which will be placed on hold until you complete the course of blood thinner medication.  It is important for you to complete the blood thinner medication as prescribed.  PRECAUTIONS:  If you experience chest pain or shortness of breath - call 911 immediately for transfer to the hospital emergency department.   If you develop a fever greater that 101 F, purulent drainage from wound, increased redness or drainage from wound, foul odor from the wound/dressing, or calf pain - CONTACT YOUR SURGEON.                                                   FOLLOW-UP APPOINTMENTS:  If you do not already have a post-op appointment, please call the office for an appointment to be seen by your surgeon.  Guidelines for how soon to be seen are listed in your After Visit Summary, but are typically between 1-4 weeks after surgery.  OTHER INSTRUCTIONS:   Knee Replacement:  Do not place pillow under knee, focus on keeping the knee straight while resting. CPM instructions: 0-90 degrees, 2 hours in the morning, 2 hours in the afternoon, and 2 hours in the evening. Place foam block, curve side up under heel at all times except when in CPM or when  walking.  DO NOT modify, tear, cut, or change the foam block in any way.  POST-OPERATIVE OPIOID TAPER INSTRUCTIONS: It is important to wean off of your opioid medication as soon as possible. If you  do not need pain medication after your surgery it is ok to stop day one. Opioids include: Codeine , Hydrocodone (Norco, Vicodin), Oxycodone (Percocet, oxycontin ) and hydromorphone amongst others.  Long term and even short term use of opiods can cause: Increased pain response Dependence Constipation Depression Respiratory depression And more.  Withdrawal symptoms can include Flu like symptoms Nausea, vomiting And more Techniques to manage these symptoms Hydrate well Eat regular healthy meals Stay active Use relaxation techniques(deep breathing, meditating, yoga) Do Not substitute Alcohol to help with tapering If you have been on opioids for less than two weeks and do not have pain than it is ok to stop all together.  Plan to wean off of opioids This plan should start within one week post op of your joint replacement. Maintain the same interval or time between taking each dose and first decrease the dose.  Cut the total daily intake of opioids by one tablet each day Next start to increase the time between doses. The last dose that should be eliminated is the evening dose.     MAKE SURE YOU:  Understand these instructions.  Get help right away if you are not doing well or get worse.    Thank you for letting us  be a part of your medical care team.  It is a privilege we respect greatly.  We hope these instructions will help you stay on track for a fast and full recovery!   Discharge instructions   Complete by: As directed    INSTRUCTIONS AFTER JOINT REPLACEMENT   Remove items at home which could result in a fall. This includes throw rugs or furniture in walking pathways ICE to the affected joint every three hours while awake for 30 minutes at a time, for at least the first 3-5 days, and then as needed for pain and swelling.  Continue to use ice for pain and swelling. You may notice swelling that will progress down to the foot and ankle.  This is normal after surgery.  Elevate your  leg when you are not up walking on it.   Continue to use the breathing machine you got in the hospital (incentive spirometer) which will help keep your temperature down.  It is common for your temperature to cycle up and down following surgery, especially at night when you are not up moving around and exerting yourself.  The breathing machine keeps your lungs expanded and your temperature down.   DIET:  As you were doing prior to hospitalization, we recommend a well-balanced diet.  DRESSING / WOUND CARE / SHOWERING  You may shower 3 days after surgery, but keep the wounds dry during showering.  You may use an occlusive plastic wrap (Press'n Seal for example), NO SOAKING/SUBMERGING IN THE BATHTUB.  If the bandage gets wet, change with a clean dry gauze.  If the incision gets wet, pat the wound dry with a clean towel.  ACTIVITY  Increase activity slowly as tolerated, but follow the weight bearing instructions below.   No driving for 6 weeks or until further direction given by your physician.  You cannot drive while taking narcotics.  No lifting or carrying greater than 10 lbs. until further directed by your surgeon. Avoid periods of inactivity such as sitting longer than an hour when not asleep. This  helps prevent blood clots.  You may return to work once you are authorized by your doctor.     WEIGHT BEARING   Weight bearing as tolerated with assist device (walker, cane, etc) as directed, use it as long as suggested by your surgeon or therapist, typically at least 4-6 weeks.   EXERCISES  Results after joint replacement surgery are often greatly improved when you follow the exercise, range of motion and muscle strengthening exercises prescribed by your doctor. Safety measures are also important to protect the joint from further injury. Any time any of these exercises cause you to have increased pain or swelling, decrease what you are doing until you are comfortable again and then slowly  increase them. If you have problems or questions, call your caregiver or physical therapist for advice.   Rehabilitation is important following a joint replacement. After just a few days of immobilization, the muscles of the leg can become weakened and shrink (atrophy).  These exercises are designed to build up the tone and strength of the thigh and leg muscles and to improve motion. Often times heat used for twenty to thirty minutes before working out will loosen up your tissues and help with improving the range of motion but do not use heat for the first two weeks following surgery (sometimes heat can increase post-operative swelling).   These exercises can be done on a training (exercise) mat, on the floor, on a table or on a bed. Use whatever works the best and is most comfortable for you.    Use music or television while you are exercising so that the exercises are a pleasant break in your day. This will make your life better with the exercises acting as a break in your routine that you can look forward to.   Perform all exercises about fifteen times, three times per day or as directed.  You should exercise both the operative leg and the other leg as well.  Exercises include:   Quad Sets - Tighten up the muscle on the front of the thigh (Quad) and hold for 5-10 seconds.   Straight Leg Raises - With your knee straight (if you were given a brace, keep it on), lift the leg to 60 degrees, hold for 3 seconds, and slowly lower the leg.  Perform this exercise against resistance later as your leg gets stronger.  Leg Slides: Lying on your back, slowly slide your foot toward your buttocks, bending your knee up off the floor (only go as far as is comfortable). Then slowly slide your foot back down until your leg is flat on the floor again.  Angel Wings: Lying on your back spread your legs to the side as far apart as you can without causing discomfort.  Hamstring Strength:  Lying on your back, push your heel  against the floor with your leg straight by tightening up the muscles of your buttocks.  Repeat, but this time bend your knee to a comfortable angle, and push your heel against the floor.  You may put a pillow under the heel to make it more comfortable if necessary.   A rehabilitation program following joint replacement surgery can speed recovery and prevent re-injury in the future due to weakened muscles. Contact your doctor or a physical therapist for more information on knee rehabilitation.    CONSTIPATION  Constipation is defined medically as fewer than three stools per week and severe constipation as less than one stool per week.  Even if you have  a regular bowel pattern at home, your normal regimen is likely to be disrupted due to multiple reasons following surgery.  Combination of anesthesia, postoperative narcotics, change in appetite and fluid intake all can affect your bowels.   YOU MUST use at least one of the following options; they are listed in order of increasing strength to get the job done.  They are all available over the counter, and you may need to use some, POSSIBLY even all of these options:    Drink plenty of fluids (prune juice may be helpful) and high fiber foods Colace 100 mg by mouth twice a day  Senokot for constipation as directed and as needed Dulcolax (bisacodyl ), take with full glass of water  Miralax (polyethylene glycol) once or twice a day as needed.  If you have tried all these things and are unable to have a bowel movement in the first 3-4 days after surgery call either your surgeon or your primary doctor.    If you experience loose stools or diarrhea, hold the medications until you stool forms back up.  If your symptoms do not get better within 1 week or if they get worse, check with your doctor.  If you experience the worst abdominal pain ever or develop nausea or vomiting, please contact the office immediately for further recommendations for  treatment.   ITCHING:  If you experience itching with your medications, try taking only a single pain pill, or even half a pain pill at a time.  You can also use Benadryl  over the counter for itching or also to help with sleep.   TED HOSE STOCKINGS:  Use stockings on both legs until for at least 2 weeks or as directed by physician office. They may be removed at night for sleeping.  MEDICATIONS:  See your medication summary on the After Visit Summary that nursing will review with you.  You may have some home medications which will be placed on hold until you complete the course of blood thinner medication.  It is important for you to complete the blood thinner medication as prescribed.  PRECAUTIONS:  If you experience chest pain or shortness of breath - call 911 immediately for transfer to the hospital emergency department.   If you develop a fever greater that 101 F, purulent drainage from wound, increased redness or drainage from wound, foul odor from the wound/dressing, or calf pain - CONTACT YOUR SURGEON.                                                   FOLLOW-UP APPOINTMENTS:  If you do not already have a post-op appointment, please call the office for an appointment to be seen by your surgeon.  Guidelines for how soon to be seen are listed in your After Visit Summary, but are typically between 1-4 weeks after surgery.  OTHER INSTRUCTIONS:   Knee Replacement:  Do not place pillow under knee, focus on keeping the knee straight while resting. CPM instructions: 0-90 degrees, 2 hours in the morning, 2 hours in the afternoon, and 2 hours in the evening. Place foam block, curve side up under heel at all times except when in CPM or when walking.  DO NOT modify, tear, cut, or change the foam block in any way.  POST-OPERATIVE OPIOID TAPER INSTRUCTIONS: It is important to wean off of your  opioid medication as soon as possible. If you do not need pain medication after your surgery it is ok to stop  day one. Opioids include: Codeine , Hydrocodone (Norco, Vicodin), Oxycodone (Percocet, oxycontin ) and hydromorphone amongst others.  Long term and even short term use of opiods can cause: Increased pain response Dependence Constipation Depression Respiratory depression And more.  Withdrawal symptoms can include Flu like symptoms Nausea, vomiting And more Techniques to manage these symptoms Hydrate well Eat regular healthy meals Stay active Use relaxation techniques(deep breathing, meditating, yoga) Do Not substitute Alcohol to help with tapering If you have been on opioids for less than two weeks and do not have pain than it is ok to stop all together.  Plan to wean off of opioids This plan should start within one week post op of your joint replacement. Maintain the same interval or time between taking each dose and first decrease the dose.  Cut the total daily intake of opioids by one tablet each day Next start to increase the time between doses. The last dose that should be eliminated is the evening dose.     MAKE SURE YOU:  Understand these instructions.  Get help right away if you are not doing well or get worse.    Thank you for letting us  be a part of your medical care team.  It is a privilege we respect greatly.  We hope these instructions will help you stay on track for a fast and full recovery!   Increase activity slowly as tolerated   Complete by: As directed    Increase activity slowly as tolerated   Complete by: As directed    Post-operative opioid taper instructions:   Complete by: As directed    POST-OPERATIVE OPIOID TAPER INSTRUCTIONS: It is important to wean off of your opioid medication as soon as possible. If you do not need pain medication after your surgery it is ok to stop day one. Opioids include: Codeine , Hydrocodone (Norco, Vicodin), Oxycodone (Percocet, oxycontin ) and hydromorphone amongst others.  Long term and even short term use of opiods can  cause: Increased pain response Dependence Constipation Depression Respiratory depression And more.  Withdrawal symptoms can include Flu like symptoms Nausea, vomiting And more Techniques to manage these symptoms Hydrate well Eat regular healthy meals Stay active Use relaxation techniques(deep breathing, meditating, yoga) Do Not substitute Alcohol to help with tapering If you have been on opioids for less than two weeks and do not have pain than it is ok to stop all together.  Plan to wean off of opioids This plan should start within one week post op of your joint replacement. Maintain the same interval or time between taking each dose and first decrease the dose.  Cut the total daily intake of opioids by one tablet each day Next start to increase the time between doses. The last dose that should be eliminated is the evening dose.      Post-operative opioid taper instructions:   Complete by: As directed    POST-OPERATIVE OPIOID TAPER INSTRUCTIONS: It is important to wean off of your opioid medication as soon as possible. If you do not need pain medication after your surgery it is ok to stop day one. Opioids include: Codeine , Hydrocodone (Norco, Vicodin), Oxycodone (Percocet, oxycontin ) and hydromorphone amongst others.  Long term and even short term use of opiods can cause: Increased pain response Dependence Constipation Depression Respiratory depression And more.  Withdrawal symptoms can include Flu like symptoms Nausea, vomiting And more Techniques to manage these symptoms Hydrate well  Eat regular healthy meals Stay active Use relaxation techniques(deep breathing, meditating, yoga) Do Not substitute Alcohol to help with tapering If you have been on opioids for less than two weeks and do not have pain than it is ok to stop all together.  Plan to wean off of opioids This plan should start within one week post op of your joint replacement. Maintain the same interval or  time between taking each dose and first decrease the dose.  Cut the total daily intake of opioids by one tablet each day Next start to increase the time between doses. The last dose that should be eliminated is the evening dose.           Follow-up Information     Sheril Coy, MD. Schedule an appointment as soon as possible for a visit in 2 week(s).   Specialty: Orthopedic Surgery Contact information: [address] [place] [address] [phone]                  Signed: Prentice Mt Leilynn Pilat 09/13/2023, 8:22 AM

## 2023-09-13 NOTE — Progress Notes (Signed)
 Subjective: 1 Day Post-Op Procedure(s) (LRB): ARTHROPLASTY, KNEE, TOTAL (Right)  Patient doing well. Pain is better. He is hoping to go home today.  Activity level:  wbat Diet tolerance:  ok Voiding:  ok Patient reports pain as mild.    Objective: Vital signs in last 24 hours: Temp:  [97.9 F (36.6 C)-98.7 F (37.1 C)] 98.6 F (37 C) (07/09 0457) Pulse Rate:  [49-95] 83 (07/09 0802) Resp:  [7-19] 18 (07/09 0457) BP: (117-179)/(69-102) 140/79 (07/09 0802) SpO2:  [94 %-100 %] 100 % (07/09 0802) Weight:  [124.1 kg] 124.1 kg (07/08 1159)  Labs: No results for input(s): HGB in the last 72 hours. No results for input(s): WBC, RBC, HCT, PLT in the last 72 hours. No results for input(s): NA, K, CL, CO2, BUN, CREATININE, GLUCOSE, CALCIUM in the last 72 hours. No results for input(s): LABPT, INR in the last 72 hours.  Physical Exam:  Neurologically intact ABD soft Neurovascular intact Sensation intact distally Intact pulses distally Dorsiflexion/Plantar flexion intact Incision: dressing C/D/I and no drainage No cellulitis present Compartment soft  Assessment/Plan:  1 Day Post-Op Procedure(s) (LRB): ARTHROPLASTY, KNEE, TOTAL (Right) Advance diet Up with therapy D/C IV fluids Discharge home with home health if cleared by PT and doing well.  Follow up in office 2 weeks post op.  Continue on 81mg  asa BID for DVT rpevention.  Prentice Mt Cleston L[place]ner 09/13/2023, 8:05 AM.

## 2023-09-13 NOTE — Progress Notes (Signed)
 Physical Therapy Treatment Patient Details Name: Seth Morales MRN: [id number] DOB: 05-08-1960 Today's Date: 09/13/2023   History of Present Illness 63 yo male presents to therapy s/p R TKA on 09/12/2023 due to failure of conservative measures. Pt PMH includes but is not limited to:  B LE edema, DOE, HLD, depression, gout, anemia, LBP, CKD, HTN, and polycystic kidney dz s/p transplant.    PT Comments  Pt reporting increased pain this afternoon.  He reports receiving IV morphine  about 1 hr prior to session.  Pt again limited by orthostatic hypotension and pain.  He was able to stand but not progress to walking.  Held exercises due to pain.  Pt does reports R knee feels unstable in standing - limited testing today, will continue to monitor.  Continue POC.   BP as follows:  Sitting 114/74 Standing (had to sit before BP complete) 93/36 with HR 107 bpm Supine immediate 96/64 Supine 3 min 142/84 with HR 92    If plan is discharge home, recommend the following: A lot of help with walking and/or transfers;A lot of help with bathing/dressing/bathroom   Can travel by private vehicle        Equipment Recommendations  Rolling walker (2 wheels)    Recommendations for Other Services       Precautions / Restrictions Precautions Precautions: Fall;Knee Restrictions Weight Bearing Restrictions Per Provider Order: Yes RLE Weight Bearing Per Provider Order: Weight bearing as tolerated     Mobility  Bed Mobility Overal bed mobility: Needs Assistance Bed Mobility: Supine to Sit, Sit to Supine     Supine to sit: Min assist, HOB elevated, Used rails Sit to supine: Min assist, HOB elevated, Used rails   General bed mobility comments: Increased time, min A for leg, slow transition due to pain  Increased time for all transfers for pain control.  Increased time with return to bed for positioning.  Pillow placed under calf and foot for elevation and support.  HOB elevated but used trendlenberg  position for LE elevation.  Placed ice and SCD  Transfers Overall transfer level: Needs assistance Equipment used: Rolling walker (2 wheels) Transfers: Sit to/from Stand Sit to Stand: Min assist, From elevated surface           General transfer comment: Bed elevated (pt is 6'6);  Stood 2 x with RW and light min A; cues for hand placement    Ambulation/Gait   Assistive device: Rolling walker (2 wheels)      Pre-gait activities: Attempted weight shifting in standing while getting BP prior to gait but pt became lightheaded and had to return to sitting General Gait Details: not able to take steps - lightheaded  Prior to sitting - pt reports knee feels unstable, slipping.  Not noting any instability in standing but also limited testing due to pain and orthostatic hypotension.  Will continue to monitor.    Stairs             Wheelchair Mobility     Tilt Bed    Modified Rankin (Stroke Patients Only)       Balance Overall balance assessment: Needs assistance Sitting-balance support: No upper extremity supported Sitting balance-Leahy Scale: Good     Standing balance support: Bilateral upper extremity supported, Reliant on assistive device for balance Standing balance-Leahy Scale: Poor                              Communication  Cognition Arousal: Alert Behavior During Therapy: WFL for tasks assessed/performed   PT - Cognitive impairments: No apparent impairments                                Cueing    Exercises Held exercises due to pain    General Comments        Pertinent Vitals/Pain Pain Assessment Pain Assessment: 0-10 Pain Score: 8  Pain Location: R knee Pain Descriptors / Indicators: Discomfort, Guarding, Grimacing Pain Intervention(s): Limited activity within patient's tolerance, Monitored during session, Ice applied, Premedicated before session, Repositioned    Home Living Family/patient expects to be  discharged to:: Private residence                        Prior Function            PT Goals (current goals can now be found in the care plan section) Acute Rehab PT Goals Patient Stated Goal: return home PT Goal Formulation: With patient Time For Goal Achievement: 09/27/23 Potential to Achieve Goals: Good Progress towards PT goals: Not progressing toward goals - comment (limited by pain and orthostatic bp)    Frequency    7X/week      PT Plan      Co-evaluation              AM-PAC PT 6 Clicks Mobility   Outcome Measure  Help needed turning from your back to your side while in a flat bed without using bedrails?: A Little Help needed moving from lying on your back to sitting on the side of a flat bed without using bedrails?: A Little Help needed moving to and from a bed to a chair (including a wheelchair)?: A Little Help needed standing up from a chair using your arms (e.g., wheelchair or bedside chair)?: A Little Help needed to walk in hospital room?: Total Help needed climbing 3-5 steps with a railing? : Total 6 Click Score: 14    End of Session Equipment Utilized During Treatment: Gait belt Activity Tolerance: Treatment limited secondary to medical complications (Comment);Patient limited by pain Patient left: in bed;with call bell/phone within reach;with bed alarm set;with SCD's reapplied Nurse Communication: Mobility status;Other (comment) (orthostatic) PT Visit Diagnosis: Other abnormalities of gait and mobility (R26.89);Muscle weakness (generalized) (M62.81)     Time: 8447-8383 PT Time Calculation (min) (ACUTE ONLY): 24 min  Charges:    $Therapeutic Exercise: 8-22 mins $Therapeutic Activity: 23-37 mins PT General Charges $$ ACUTE PT VISIT: 1 Visit                     Benjiman, PT Acute Rehab [facility] [phone]    Benjiman VEAR Mulberry 09/13/2023, 4:29 PM

## 2023-09-14 ENCOUNTER — Encounter (HOSPITAL_COMMUNITY): Payer: Self-pay | Admitting: Orthopaedic Surgery

## 2023-09-14 DIAGNOSIS — Z940 Kidney transplant status: Secondary | ICD-10-CM | POA: Diagnosis not present

## 2023-09-14 DIAGNOSIS — Z85828 Personal history of other malignant neoplasm of skin: Secondary | ICD-10-CM | POA: Diagnosis not present

## 2023-09-14 DIAGNOSIS — Z7982 Long term (current) use of aspirin: Secondary | ICD-10-CM | POA: Diagnosis not present

## 2023-09-14 DIAGNOSIS — Z6833 Body mass index (BMI) 33.0-33.9, adult: Secondary | ICD-10-CM | POA: Diagnosis not present

## 2023-09-14 DIAGNOSIS — Z8249 Family history of ischemic heart disease and other diseases of the circulatory system: Secondary | ICD-10-CM | POA: Diagnosis not present

## 2023-09-14 DIAGNOSIS — Z79899 Other long term (current) drug therapy: Secondary | ICD-10-CM | POA: Diagnosis not present

## 2023-09-14 DIAGNOSIS — E785 Hyperlipidemia, unspecified: Secondary | ICD-10-CM | POA: Diagnosis present

## 2023-09-14 DIAGNOSIS — M1711 Unilateral primary osteoarthritis, right knee: Secondary | ICD-10-CM | POA: Diagnosis present

## 2023-09-14 DIAGNOSIS — Z79624 Long term (current) use of inhibitors of nucleotide synthesis: Secondary | ICD-10-CM | POA: Diagnosis not present

## 2023-09-14 DIAGNOSIS — E559 Vitamin D deficiency, unspecified: Secondary | ICD-10-CM | POA: Diagnosis present

## 2023-09-14 DIAGNOSIS — Z841 Family history of disorders of kidney and ureter: Secondary | ICD-10-CM | POA: Diagnosis not present

## 2023-09-14 DIAGNOSIS — Z8261 Family history of arthritis: Secondary | ICD-10-CM | POA: Diagnosis not present

## 2023-09-14 DIAGNOSIS — Z905 Acquired absence of kidney: Secondary | ICD-10-CM | POA: Diagnosis not present

## 2023-09-14 DIAGNOSIS — Z7952 Long term (current) use of systemic steroids: Secondary | ICD-10-CM | POA: Diagnosis not present

## 2023-09-14 DIAGNOSIS — E66811 Obesity, class 1: Secondary | ICD-10-CM | POA: Diagnosis present

## 2023-09-14 DIAGNOSIS — Z833 Family history of diabetes mellitus: Secondary | ICD-10-CM | POA: Diagnosis not present

## 2023-09-14 NOTE — Progress Notes (Signed)
 Physical Therapy Treatment Patient Details Name: Seth Morales MRN: [id number] DOB: 12-07-1960 Today's Date: 09/14/2023   History of Present Illness 63 yo male presents to therapy s/p R TKA on 09/12/2023 due to failure of conservative measures. Pt PMH includes but is not limited to:  B LE edema, DOE, HLD, depression, gout, anemia, LBP, CKD, HTN, and polycystic kidney dz s/p transplant.    PT Comments  Pt assisted slowly with transitional movement and denies dizziness with activity this morning.  Pt able to ambulate 10 ft however reported UE fatigue and requested recliner.  BP's obtained upon sitting EOB and then again after ambulating (see below) and had significant drop (RN notified and aware).   Pt returned to room in recliner and performed LE exercises.    If plan is discharge home, recommend the following: A lot of help with walking and/or transfers;A lot of help with bathing/dressing/bathroom;Help with stairs or ramp for entrance   Can travel by private vehicle        Equipment Recommendations  Rolling walker (2 wheels)    Recommendations for Other Services       Precautions / Restrictions Precautions Precautions: Fall;Knee Restrictions RLE Weight Bearing Per Provider Order: Weight bearing as tolerated     Mobility  Bed Mobility Overal bed mobility: Needs Assistance Bed Mobility: Supine to Sit     Supine to sit: Min assist, HOB elevated, Used rails     General bed mobility comments: Increased time, min A for leg, slow transition due to pain; pt denies dizziness but requested sitting BP: 149/79 mmHg; HR 87 bpm    Transfers Overall transfer level: Needs assistance Equipment used: Rolling walker (2 wheels) Transfers: Sit to/from Stand Sit to Stand: Min assist, From elevated surface           General transfer comment: Bed elevated (pt is 6'6); verbal cues for UE and LE positioning for pain control    Ambulation/Gait Ambulation/Gait assistance: Min assist, +2  safety/equipment Gait Distance (Feet): 10 Feet Assistive device: Rolling walker (2 wheels) Gait Pattern/deviations: Step-to pattern, Antalgic, Decreased stance time - right Gait velocity: decreased     General Gait Details: verbal cues for sequence, RW positioning, posture, step length; UE fatigue limiting distance and pt requested recliner; pt denies dizziness but BP dropped to 102/75 mmHg, HR 110 bpm (RN notified)   Stairs             Wheelchair Mobility     Tilt Bed    Modified Rankin (Stroke Patients Only)       Balance                                            Communication Communication Communication: No apparent difficulties  Cognition Arousal: Alert Behavior During Therapy: WFL for tasks assessed/performed   PT - Cognitive impairments: No apparent impairments                         Following commands: Intact      Cueing    Exercises Total Joint Exercises Ankle Circles/Pumps: AROM, Both, 10 reps, Supine Quad Sets: AROM, Both, 10 reps, Supine Heel Slides: AAROM, Right, 10 reps Hip ABduction/ADduction: AAROM, Right, 10 reps Straight Leg Raises: AAROM, 10 reps, Right    General Comments        Pertinent Vitals/Pain Pain Assessment Pain Assessment:  0-10 Pain Score: 7  Pain Location: R knee Pain Descriptors / Indicators: Discomfort, Guarding, Grimacing, Tender, Tightness, Sore Pain Intervention(s): Monitored during session, Repositioned, Premedicated before session    Home Living                          Prior Function            PT Goals (current goals can now be found in the care plan section) Progress towards PT goals: Progressing toward goals    Frequency    7X/week      PT Plan      Co-evaluation              AM-PAC PT 6 Clicks Mobility   Outcome Measure  Help needed turning from your back to your side while in a flat bed without using bedrails?: A Little Help needed  moving from lying on your back to sitting on the side of a flat bed without using bedrails?: A Little Help needed moving to and from a bed to a chair (including a wheelchair)?: A Little Help needed standing up from a chair using your arms (e.g., wheelchair or bedside chair)?: A Little Help needed to walk in hospital room?: A Lot Help needed climbing 3-5 steps with a railing? : A Lot 6 Click Score: 16    End of Session Equipment Utilized During Treatment: Gait belt Activity Tolerance: Patient limited by fatigue Patient left: with call bell/phone within reach;in chair;with chair alarm set   PT Visit Diagnosis: Other abnormalities of gait and mobility (R26.89);Muscle weakness (generalized) (M62.81)     Time: 1014-1040 PT Time Calculation (min) (ACUTE ONLY): 26 min  Charges:    $Gait Training: 8-22 mins $Therapeutic Exercise: 8-22 mins PT General Charges $$ ACUTE PT VISIT: 1 Visit                     Seth Morales, DPT Physical Therapist Acute Rehabilitation Services Office: [phone]    Seth CROME Payson 09/14/2023, 4:38 PM

## 2023-09-14 NOTE — Progress Notes (Signed)
 Physical Therapy Treatment Patient Details Name: Seth Morales MRN: [id number] DOB: [birth year] Today's Date: 09/14/2023   History of Present Illness 63 yo male presents to therapy s/p R TKA on 09/12/2023 due to failure of conservative measures. Pt PMH includes but is not limited to:  B LE edema, DOE, HLD, depression, gout, anemia, LBP, CKD, HTN, and polycystic kidney dz s/p transplant.    PT Comments  Pt assisted with ambulating again this afternoon and progressed distance to 36 ft.  Pt denies dizziness, only reporting right knee pain and UE fatigue.  Pt with R LE edema so returned to bed for elevation and ice.  Pt will need to practice at least one step prior to d/c and has not yet met goals from PT standpoint to safely return home.      If plan is discharge home, recommend the following: A lot of help with walking and/or transfers;A lot of help with bathing/dressing/bathroom;Help with stairs or ramp for entrance   Can travel by private vehicle        Equipment Recommendations  Rolling walker (2 wheels)    Recommendations for Other Services       Precautions / Restrictions Precautions Precautions: Fall;Knee Restrictions RLE Weight Bearing Per Provider Order: Weight bearing as tolerated     Mobility  Bed Mobility Overal bed mobility: Needs Assistance Bed Mobility: Supine to Sit, Sit to Supine     Supine to sit: Contact guard, HOB elevated, Used rails Sit to supine: Contact guard assist, HOB elevated, Used rails   General bed mobility comments: Increased time and effort due to pain; pt able to self assist R LE with gait belt    Transfers Overall transfer level: Needs assistance Equipment used: Rolling walker (2 wheels) Transfers: Sit to/from Stand Sit to Stand: Min assist, From elevated surface           General transfer comment: Bed elevated (pt is 6'6); verbal cues for UE and LE positioning for pain control    Ambulation/Gait Ambulation/Gait assistance:  Contact guard assist Gait Distance (Feet): 36 Feet Assistive device: Rolling walker (2 wheels) Gait Pattern/deviations: Step-to pattern, Antalgic, Decreased stance time - right Gait velocity: decreased     General Gait Details: verbal cues for sequence, RW positioning, posture, step length; UE fatigue limiting distance however pt felt able to ambulate back into room this afternoon   Stairs             Wheelchair Mobility     Tilt Bed    Modified Rankin (Stroke Patients Only)       Balance                                            Communication Communication Communication: No apparent difficulties  Cognition Arousal: Alert Behavior During Therapy: WFL for tasks assessed/performed   PT - Cognitive impairments: No apparent impairments                         Following commands: Intact      Cueing    Exercises     General Comments        Pertinent Vitals/Pain Pain Assessment Pain Assessment: 0-10 Pain Score: 7  Pain Location: R knee Pain Descriptors / Indicators: Discomfort, Guarding, Grimacing, Tender, Tightness, Sore Pain Intervention(s): Repositioned, Monitored during session, Ice applied    Home  Living                          Prior Function            PT Goals (current goals can now be found in the care plan section) Progress towards PT goals: Progressing toward goals    Frequency    7X/week      PT Plan      Co-evaluation              AM-PAC PT 6 Clicks Mobility   Outcome Measure  Help needed turning from your back to your side while in a flat bed without using bedrails?: A Little Help needed moving from lying on your back to sitting on the side of a flat bed without using bedrails?: A Little Help needed moving to and from a bed to a chair (including a wheelchair)?: A Little Help needed standing up from a chair using your arms (e.g., wheelchair or bedside chair)?: A Little Help  needed to walk in hospital room?: A Little Help needed climbing 3-5 steps with a railing? : A Lot 6 Click Score: 17    End of Session Equipment Utilized During Treatment: Gait belt Activity Tolerance: Patient limited by fatigue;Patient tolerated treatment well Patient left: in bed;with call bell/phone within reach (agreeable to call for assist OOB)   PT Visit Diagnosis: Other abnormalities of gait and mobility (R26.89);Muscle weakness (generalized) (M62.81)     Time: 8575-8555 PT Time Calculation (min) (ACUTE ONLY): 20 min  Charges:    $Gait Training: 8-22 mins  PT General Charges $$ ACUTE PT VISIT: 1 Visit                    Tari KLEIN, DPT Physical Therapist Acute Rehabilitation Services Office: [phone]    [name] 09/14/2023, 4:45 PM

## 2023-09-14 NOTE — Progress Notes (Signed)
 Subjective: 2 Days Post-Op Procedure(s) (LRB): ARTHROPLASTY, KNEE, TOTAL (Right)  Patient feels better this morning. He states that he slept well last night. He is hoping to go home today.  Activity level:  wbat Diet tolerance:  ok Voiding:  ok Patient reports pain as mild.    Objective: Vital signs in last 24 hours: Temp:  [97.8 F (36.6 C)-98.5 F (36.9 C)] 98.5 F (36.9 C) (07/10 0538) Pulse Rate:  [70-95] 93 (07/10 0538) Resp:  [16-18] 18 (07/10 0538) BP: (127-157)/(71-95) 156/95 (07/10 0538) SpO2:  [93 %-100 %] 93 % (07/10 0538)  Labs: No results for input(s): HGB in the last 72 hours. No results for input(s): WBC, RBC, HCT, PLT in the last 72 hours. No results for input(s): NA, K, CL, CO2, BUN, CREATININE, GLUCOSE, CALCIUM in the last 72 hours. No results for input(s): LABPT, INR in the last 72 hours.  Physical Exam:  Neurologically intact ABD soft Neurovascular intact Sensation intact distally Intact pulses distally Dorsiflexion/Plantar flexion intact Incision: dressing C/D/I and no drainage No cellulitis present Compartment soft  Assessment/Plan:  2 Days Post-Op Procedure(s) (LRB): ARTHROPLASTY, KNEE, TOTAL (Right) Advance diet Up with therapy D/c home today if cleared by PT and if BP stabilizes with ambulation. He will try to sit up more and get up out of bed more today. Continue on 81mg  asa BID for DVT prevention.  Follow up in office 2 weeks post op.    Seth Morales 09/14/2023, 7:04 AM

## 2023-09-15 NOTE — Progress Notes (Signed)
 Physical Therapy Treatment Patient Details Name: Seth Morales MRN: [id number] DOB: 05-09-1960 Today's Date: 09/15/2023   History of Present Illness 63 yo male presents to therapy s/p R TKA on 09/12/2023 due to failure of conservative measures. Pt PMH includes but is not limited to:  B LE edema, DOE, HLD, depression, gout, anemia, LBP, CKD, HTN, and polycystic kidney dz s/p transplant.    PT Comments  Pt assisted with ambulation and denies dizziness this afternoon.  Pt had not had pain medication and reports pain tolerable.  BP did not drop with ambulation (see below).  Ice packs provided and LEs elevated end of session.  Hopefully, pt will be performing well again in the morning and be able to d/c home.    If plan is discharge home, recommend the following: A lot of help with walking and/or transfers;A lot of help with bathing/dressing/bathroom;Help with stairs or ramp for entrance   Can travel by private vehicle        Equipment Recommendations  Rolling walker (2 wheels)    Recommendations for Other Services       Precautions / Restrictions Precautions Precautions: Fall;Knee Restrictions RLE Weight Bearing Per Provider Order: Weight bearing as tolerated     Mobility  Bed Mobility Overal bed mobility: Needs Assistance Bed Mobility: Supine to Sit, Sit to Supine     Supine to sit: Min assist Sit to supine: Min assist   General bed mobility comments: Increased time and effort due to pain; assist for R LE due to pain; pt has lift recliner he can use upon d/c if needed    Transfers Overall transfer level: Needs assistance Equipment used: Rolling walker (2 wheels) Transfers: Sit to/from Stand Sit to Stand: Contact guard assist, From elevated surface           General transfer comment: Bed elevated (pt is 6'6); verbal cues for UE and LE positioning for pain control (also has lift recliner)    Ambulation/Gait Ambulation/Gait assistance: Contact guard assist Gait  Distance (Feet): 40 Feet Assistive device: Rolling walker (2 wheels) Gait Pattern/deviations: Step-to pattern, Antalgic, Decreased stance time - right Gait velocity: decreased     General Gait Details: verbal cues for sequence, RW positioning, posture, step length; no dizziness; BP 146/78 mmHg, HR 117 bpm upon sitting   Stairs Stairs: Yes Stairs assistance: Contact guard assist Stair Management: Step to pattern, Forwards, With walker Number of Stairs: 1 General stair comments: verbal cues for sequence and safety; pt dizzy after performing and requested recliner; BP 95/53 mmHg, HR 70 bpm (RN notified)   Wheelchair Mobility     Tilt Bed    Modified Rankin (Stroke Patients Only)       Balance                                            Communication Communication Communication: No apparent difficulties  Cognition Arousal: Alert Behavior During Therapy: WFL for tasks assessed/performed   PT - Cognitive impairments: No apparent impairments                         Following commands: Intact      Cueing    Exercises      General Comments        Pertinent Vitals/Pain Pain Assessment Pain Assessment: 0-10 Pain Score: 5  Pain Location: R knee  Pain Descriptors / Indicators: Discomfort, Guarding, Grimacing, Tender, Tightness, Sore Pain Intervention(s): Ice applied, Repositioned (has not had pain meds, reports pain tolerable this afternoon, ice applied end of session)    Home Living                          Prior Function            PT Goals (current goals can now be found in the care plan section) Progress towards PT goals: Progressing toward goals    Frequency    7X/week      PT Plan      Co-evaluation              AM-PAC PT 6 Clicks Mobility   Outcome Measure  Help needed turning from your back to your side while in a flat bed without using bedrails?: A Little Help needed moving from lying on your  back to sitting on the side of a flat bed without using bedrails?: A Little Help needed moving to and from a bed to a chair (including a wheelchair)?: A Little Help needed standing up from a chair using your arms (e.g., wheelchair or bedside chair)?: A Little Help needed to walk in hospital room?: A Little Help needed climbing 3-5 steps with a railing? : A Little 6 Click Score: 18    End of Session Equipment Utilized During Treatment: Gait belt Activity Tolerance: Patient tolerated treatment well Patient left: with call bell/phone within reach;in bed;with bed alarm set Nurse Communication: Mobility status PT Visit Diagnosis: Other abnormalities of gait and mobility (R26.89)     Time: 8544-8484 PT Time Calculation (min) (ACUTE ONLY): 20 min  Charges:    $Gait Training: 8-22 mins PT General Charges $$ ACUTE PT VISIT: 1 Visit                     Tari KLEIN, DPT Physical Therapist Acute Rehabilitation Services Office: [phone]    [name] 09/15/2023, 4:53 PM

## 2023-09-15 NOTE — Progress Notes (Signed)
 Physical Therapy Treatment Patient Details Name: Seth Morales MRN: [id number] DOB: [birth year] Today's Date: 09/15/2023   History of Present Illness 63 yo male presents to therapy s/p R TKA on 09/12/2023 due to failure of conservative measures. Pt PMH includes but is not limited to:  B LE edema, DOE, HLD, depression, gout, anemia, LBP, CKD, HTN, and polycystic kidney dz s/p transplant.    PT Comments  Pt ambulated into hallway and practiced one step.  Pt dizzy upon descending step and requested recliner.  BP 95/53 mmHg and HR 70 bpm so RN notified.     If plan is discharge home, recommend the following: A lot of help with walking and/or transfers;A lot of help with bathing/dressing/bathroom;Help with stairs or ramp for entrance   Can travel by private vehicle        Equipment Recommendations  Rolling walker (2 wheels)    Recommendations for Other Services       Precautions / Restrictions Precautions Precautions: Fall;Knee Restrictions RLE Weight Bearing Per Provider Order: Weight bearing as tolerated     Mobility  Bed Mobility Overal bed mobility: Needs Assistance Bed Mobility: Supine to Sit     Supine to sit: Contact guard, HOB elevated, Used rails     General bed mobility comments: Increased time and effort due to pain; pt able to self assist R LE with gait belt    Transfers Overall transfer level: Needs assistance Equipment used: Rolling walker (2 wheels) Transfers: Sit to/from Stand Sit to Stand: Contact guard assist, From elevated surface           General transfer comment: Bed elevated (pt is 6'6); verbal cues for UE and LE positioning for pain control    Ambulation/Gait Ambulation/Gait assistance: Contact guard assist Gait Distance (Feet): 10 Feet Assistive device: Rolling walker (2 wheels) Gait Pattern/deviations: Step-to pattern, Antalgic, Decreased stance time - right Gait velocity: decreased     General Gait Details: verbal cues for sequence,  RW positioning, posture, step length; limited by dizziness   Stairs Stairs: Yes Stairs assistance: Contact guard assist Stair Management: Step to pattern, Forwards, With walker Number of Stairs: 1 General stair comments: verbal cues for sequence and safety; pt dizzy after performing and requested recliner; BP 95/53 mmHg, HR 70 bpm (RN notified)   Wheelchair Mobility     Tilt Bed    Modified Rankin (Stroke Patients Only)       Balance                                            Communication Communication Communication: No apparent difficulties  Cognition Arousal: Alert Behavior During Therapy: WFL for tasks assessed/performed   PT - Cognitive impairments: No apparent impairments                         Following commands: Intact      Cueing    Exercises      General Comments        Pertinent Vitals/Pain Pain Assessment Pain Assessment: 0-10 Pain Score: 5  Pain Location: R knee Pain Descriptors / Indicators: Discomfort, Guarding, Grimacing, Tender, Tightness, Sore Pain Intervention(s): Repositioned, Monitored during session, Ice applied    Home Living  Prior Function            PT Goals (current goals can now be found in the care plan section) Progress towards PT goals: Progressing toward goals    Frequency    7X/week      PT Plan      Co-evaluation              AM-PAC PT 6 Clicks Mobility   Outcome Measure  Help needed turning from your back to your side while in a flat bed without using bedrails?: A Little Help needed moving from lying on your back to sitting on the side of a flat bed without using bedrails?: A Little Help needed moving to and from a bed to a chair (including a wheelchair)?: A Little Help needed standing up from a chair using your arms (e.g., wheelchair or bedside chair)?: A Little Help needed to walk in hospital room?: A Little Help needed climbing  3-5 steps with a railing? : A Little 6 Click Score: 18    End of Session Equipment Utilized During Treatment: Gait belt Activity Tolerance: Patient limited by fatigue;Other (comment) (dizziness, low BP) Patient left: with call bell/phone within reach;in chair;with chair alarm set   PT Visit Diagnosis: Other abnormalities of gait and mobility (R26.89)     Time: 9046-8986 PT Time Calculation (min) (ACUTE ONLY): 20 min  Charges:    $Gait Training: 8-22 mins PT General Charges $$ ACUTE PT VISIT: 1 Visit                     Tari KLEIN, DPT Physical Therapist Acute Rehabilitation Services Office: [phone]    [name] 09/15/2023, 12:55 PM

## 2023-09-15 NOTE — Progress Notes (Signed)
 Subjective: 3 Days Post-Op Procedure(s) (LRB): ARTHROPLASTY, KNEE, TOTAL (Right)  Patient walked better with PT yesterday. He is hoping to pass PT and go home today.  Activity level:  wbat Diet tolerance:  ok Voiding:  ok Patient reports pain as mild.    Objective: Vital signs in last 24 hours: Temp:  [98.5 F (36.9 C)-99.1 F (37.3 C)] 99.1 F (37.3 C) (07/11 0541) Pulse Rate:  [84-109] 104 (07/11 0752) Resp:  [16-18] 16 (07/11 0541) BP: (124-159)/(69-75) 124/75 (07/11 0752) SpO2:  [96 %-99 %] 96 % (07/11 0752)  Labs: No results for input(s): HGB in the last 72 hours. No results for input(s): WBC, RBC, HCT, PLT in the last 72 hours. No results for input(s): NA, K, CL, CO2, BUN, CREATININE, GLUCOSE, CALCIUM in the last 72 hours. No results for input(s): LABPT, INR in the last 72 hours.  Physical Exam:  Neurologically intact ABD soft Neurovascular intact Sensation intact distally Intact pulses distally Dorsiflexion/Plantar flexion intact Incision: dressing C/D/I and no drainage No cellulitis present Compartment soft  Assessment/Plan:  3 Days Post-Op Procedure(s) (LRB): ARTHROPLASTY, KNEE, TOTAL (Right) Advance diet Up with therapy D/C home today if cleared by PT. I informed patient that he really needs to get up and be mobile to help with DVT prevention and also to make sure that his knee continues to progress and improve. Continue on 81mg  asa BID for DVT prevention.      Seth Morales [place] Dickard 09/15/2023, 8:07 AM

## 2023-09-16 NOTE — TOC Progression Note (Signed)
 Transition of Care [facility]) - Progression Note    Patient Details  Name: Seth Morales MRN: [id number] Date of Birth: 1961/02/01  [facility] CM/SW Contact  Sheri [name], LCSW Phone Number: 09/16/2023, 1:06 PM  Clinical Narrative:    BSC ordered via [employer] to be delivered to room.    Barriers to Discharge: No Barriers Identified  Expected Discharge Plan and Services         Expected Discharge Date: 09/16/23               DME Arranged: Vannie rolling DME Agency: [employer] Date DME Agency Contacted: 09/13/23 Time DME Agency Contacted: 0930 Representative spoke with at DME Agency: Kaitlyn             Social Determinants of Health (SDOH) Interventions SDOH Screenings   Food Insecurity: No Food Insecurity (09/12/2023)  Housing: Low Risk  (09/12/2023)  Transportation Needs: No Transportation Needs (09/12/2023)  Utilities: Not At Risk (09/12/2023)  Depression (PHQ2-9): Low Risk  (03/05/2020)  Tobacco Use: Low Risk  (09/12/2023)    Readmission Risk Interventions     No data to display

## 2023-09-16 NOTE — Progress Notes (Signed)
 Physical Therapy Treatment Patient Details Name: Seth Morales MRN: [id number] DOB: 22-Aug-1960 Today's Date: 09/16/2023   History of Present Illness 63 yo male presents to therapy s/p R TKA on 09/12/2023 due to failure of conservative measures. Pt PMH includes but is not limited to:  B LE edema, DOE, HLD, depression, gout, anemia, LBP, CKD, HTN, and polycystic kidney dz s/p transplant.    PT Comments  Pt ambulated in hallway and able to progress distance today (which was his personal goal).  Pt also denies dizziness.  Pt declined need to practice one step (which was performed yesterday) however verbally reviewed.  Pt also provided with HEP handout.  Pt's spouse on his cell phone end of session and asking questions.  Reviewed safety with toilet and car transfers.  Pt also has small standing shower and may need to sponge bathe upon return home until tolerating standing better.  Pt's spouse requesting BSC to better assist pt with toilet transfers - pt's height 6'5-6'6 (RN notified).   Pt ready for d/c home today.    If plan is discharge home, recommend the following: Help with stairs or ramp for entrance;A little help with walking and/or transfers;A little help with bathing/dressing/bathroom   Can travel by private vehicle        Equipment Recommendations  Rolling walker (2 wheels)    Recommendations for Other Services       Precautions / Restrictions Precautions Precautions: Fall;Knee Restrictions RLE Weight Bearing Per Provider Order: Weight bearing as tolerated     Mobility  Bed Mobility Overal bed mobility: Needs Assistance Bed Mobility: Supine to Sit, Sit to Supine     Supine to sit: Contact guard Sit to supine: Min assist   General bed mobility comments: Increased time and effort due to pain; assist for R LE due to pain; pt has lift recliner he can use upon d/c if needed    Transfers Overall transfer level: Needs assistance Equipment used: Rolling walker (2  wheels) Transfers: Sit to/from Stand Sit to Stand: Contact guard assist, From elevated surface           General transfer comment: Bed elevated (pt is 6'6); verbal cues for UE and LE positioning for pain control (also has lift recliner)    Ambulation/Gait Ambulation/Gait assistance: Contact guard assist Gait Distance (Feet): 55 Feet Assistive device: Rolling walker (2 wheels) Gait Pattern/deviations: Step-to pattern, Antalgic, Decreased stance time - right Gait velocity: decreased     General Gait Details: verbal cues for sequence, RW positioning, posture, step length; no dizziness   Stairs             Wheelchair Mobility     Tilt Bed    Modified Rankin (Stroke Patients Only)       Balance                                            Communication Communication Communication: No apparent difficulties  Cognition Arousal: Alert Behavior During Therapy: WFL for tasks assessed/performed   PT - Cognitive impairments: No apparent impairments                         Following commands: Intact      Cueing    Exercises      General Comments        Pertinent Vitals/Pain Pain Assessment Pain  Assessment: 0-10 Pain Score: 3  Pain Location: R knee Pain Descriptors / Indicators: Discomfort, Tender, Tightness, Sore Pain Intervention(s): Monitored during session, Repositioned, Ice applied    Home Living                          Prior Function            PT Goals (current goals can now be found in the care plan section) Progress towards PT goals: Progressing toward goals    Frequency    7X/week      PT Plan      Co-evaluation              AM-PAC PT 6 Clicks Mobility   Outcome Measure  Help needed turning from your back to your side while in a flat bed without using bedrails?: A Little Help needed moving from lying on your back to sitting on the side of a flat bed without using bedrails?: A  Little Help needed moving to and from a bed to a chair (including a wheelchair)?: A Little Help needed standing up from a chair using your arms (e.g., wheelchair or bedside chair)?: A Little Help needed to walk in hospital room?: A Little Help needed climbing 3-5 steps with a railing? : A Little 6 Click Score: 18    End of Session Equipment Utilized During Treatment: Gait belt Activity Tolerance: Patient tolerated treatment well Patient left: in bed;with call bell/phone within reach;with bed alarm set Nurse Communication: Mobility status PT Visit Diagnosis: Other abnormalities of gait and mobility (R26.89)     Time: 8987-8960 PT Time Calculation (min) (ACUTE ONLY): 27 min  Charges:    $Gait Training: 8-22 mins $Therapeutic Activity: 8-22 mins PT General Charges $$ ACUTE PT VISIT: 1 Visit                    Tari KLEIN, DPT Physical Therapist Acute Rehabilitation Services Office: [phone]    [name] 09/16/2023, 3:28 PM

## 2023-09-16 NOTE — Discharge Instructions (Signed)
 Discharge Instructions  Use ice on the knee intermittently Pain medication has been prescribed for you.  Leave your dressing on until your first follow up visit.  You may shower with the dressing.     Please call [phone] during normal business hours or [phone] after hours for any problems. Including the following:  - excessive redness of the incisions - drainage for more than 4 days - fever of more than 101.5 F  *Please note that pain medications will not be refilled after hours or on weekends.   Dental Antibiotics:  In most cases prophylactic antibiotics for Dental procdeures after total joint surgery are not necessary.  Exceptions are as follows:  1. History of prior total joint infection  2. Severely immunocompromised (Organ Transplant, cancer chemotherapy, Rheumatoid biologic meds such as Humera)  3. Poorly controlled diabetes (A1C &gt; 8.0, blood glucose over 200)  If you have one of these conditions, contact your surgeon for an antibiotic prescription, prior to your dental procedure.

## 2023-09-16 NOTE — Progress Notes (Signed)
 PATIENT ID: Seth Morales  MRN: [id number]  DOB/AGE:  1960/04/29 / 63 y.o.  4 Days Post-Op Procedure(s) (LRB): ARTHROPLASTY, KNEE, TOTAL (Right)  Subjective: Patient reports moderate pain in the right knee. States he is eager to go home today. No issues overnight. Voiding well. Positive BM and flatus  Objective: Vital signs in last 24 hours: Temp:  [98.7 F (37.1 C)-99.8 F (37.7 C)] 98.7 F (37.1 C) (07/12 0509) Pulse Rate:  [82-100] 93 (07/12 0509) Resp:  [17-18] 17 (07/12 0509) BP: (121-164)/(70-91) 121/70 (07/12 0509) SpO2:  [96 %-97 %] 96 % (07/12 0509)  Intake/Output from previous day: 07/11 0701 - 07/12 0700 In: 720 [P.O.:720] Out: 2375 [Urine:2375]   Physical Exam: Neurologically intact ABD soft Neurovascular intact Sensation intact distally Intact pulses distally Dorsiflexion/Plantar flexion intact Incision: dressing C/I and scant drainage on inferior aspect of dressing No cellulitis present Compartment soft  Assessment/Plan: 4 Days Post-Op Procedure(s) (LRB): ARTHROPLASTY, KNEE, TOTAL (Right)  Advance diet Up with therapy D/C home today if cleared by PT. I informed patient that he really needs to get up and be mobile to help with DVT prevention and also to make sure that his knee continues to progress and improve. Continue on 81mg  asa BID for DVT prevention.   Tyree Fluharty L. [place], PA-C 09/16/2023, 9:27 AM

## 2023-09-19 ENCOUNTER — Encounter: Payer: Self-pay | Admitting: Family Medicine

## 2023-09-19 ENCOUNTER — Encounter (INDEPENDENT_AMBULATORY_CARE_PROVIDER_SITE_OTHER): Payer: Self-pay | Admitting: Adult Health

## 2023-10-03 ENCOUNTER — Encounter (INDEPENDENT_AMBULATORY_CARE_PROVIDER_SITE_OTHER): Payer: Self-pay | Admitting: Adult Health

## 2023-10-03 ENCOUNTER — Telehealth (INDEPENDENT_AMBULATORY_CARE_PROVIDER_SITE_OTHER): Admitting: Adult Health

## 2023-10-03 DIAGNOSIS — G8929 Other chronic pain: Secondary | ICD-10-CM

## 2023-10-03 DIAGNOSIS — E88819 Insulin resistance, unspecified: Secondary | ICD-10-CM

## 2023-10-03 DIAGNOSIS — E559 Vitamin D deficiency, unspecified: Secondary | ICD-10-CM | POA: Diagnosis not present

## 2023-10-03 DIAGNOSIS — Z6831 Body mass index (BMI) 31.0-31.9, adult: Secondary | ICD-10-CM

## 2023-10-03 DIAGNOSIS — M25561 Pain in right knee: Secondary | ICD-10-CM

## 2023-10-03 DIAGNOSIS — E669 Obesity, unspecified: Secondary | ICD-10-CM

## 2023-10-03 DIAGNOSIS — Z6833 Body mass index (BMI) 33.0-33.9, adult: Secondary | ICD-10-CM

## 2023-10-03 NOTE — Progress Notes (Signed)
 WEIGHT SUMMARY AND BIOMETRICS  No data recorded No data recorded No data recorded Other Clinical Data Comments: virtual visit    Chief Complaint:  I connected with  Seth Morales on 10/03/23 by a video and audio enabled telemedicine application and verified that I am speaking with the correct person using two identifiers.  Patient Location: Home  Provider Location: Office/Clinic  I discussed the limitations of evaluation and management by telemedicine. The patient expressed understanding and agreed to proceed.  OBESITY Seth Morales is here to discuss his progress with his obesity treatment plan. He is on the practicing portion control and making smarter food choices, such as increasing vegetables and decreasing simple carbohydrates and states he is following his eating plan approximately 100 % of the time. He states he is exercising Home Physical Therapy (PT) 3 x week with therapist plus daily exercises and ambulating around home.   Interim History:   08/08/23  Weight 266 lb (120.7 kg)  BMI (Calculated) 31.54   Weight today estimated off last in office   09/12/2023 R TKA He was hospitalized fro 4 days post operatively He has started home PT with home therapist  Pt visualized during virtual visit- he appears stable, not in acute distress Pt seen ambulating safely with walker- no dyspnea noted. 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 Chronic pain of right knee Admit date: 09/12/2023 Discharge date: 09/15/2023   Admission Diagnoses:  Principal Problem:   S/P total knee arthroplasty, right Surgeries: Procedure(s): ARTHROPLASTY, KNEE, TOTAL on 09/12/2023   Consultants:    Discharged Condition: Improved   Hospital Course: Seth Morales is an 63 y.o. male who was admitted 09/12/2023 for operative treatment ofS/P total  knee arthroplasty, right. Patient has severe unremitting pain that affects sleep, daily activities, and work/hobbies. After pre-op clearance the patient was taken to the operating room on 09/12/2023 and underwent  Procedure(s): ARTHROPLASTY, KNEE, TOTAL.     Patient was given perioperative antibiotics:  Anti-infectives (From admission, onward)        Start     Dose/Rate Route Frequency Ordered Stop    09/12/23 2200   doxycycline  (VIBRA -TABS) tablet 100 mg        100 mg Oral 2 times daily 09/12/23 1756      09/12/23 2000   ceFAZolin  (ANCEF ) IVPB 2g/100 mL premix        2 g 200 mL/hr over 30 Minutes Intravenous Every 6 hours 09/12/23 1544 09/13/23 0310    09/12/23 1115   ceFAZolin  (ANCEF ) IVPB 3g/150 mL premix        3 g 300 mL/hr over 30 Minutes Intravenous On call to O.R. 09/12/23 1106 09/12/23 1330             Patient was given sequential compression devices, early ambulation, and chemoprophylaxis to prevent DVT.   Patient benefited maximally from hospital stay and there were no complications.    2. Vitamin D  deficiency  Latest Reference Range & Units 09/29/21 09:52 06/22/22 08:07 04/13/23 11:45  Vitamin D , 25-Hydroxy 30.0 - 100.0 ng/mL 67.7 54.0 68.5   He is on Cholecalciferol (VITAMIN D ) 125 MCG (5000 UT) CAPS   3. Insulin  resistance He is not currently on any antidiabetic medications  Lab Results  Component Value Date   HGBA1C 6.2 (H) 04/13/2023  HGBA1C 5.7 (H) 06/22/2022   HGBA1C 5.4 09/29/2021     Latest Reference Range & Units 09/29/21 09:52 06/22/22 08:07  INSULIN  2.6 - 24.9 uIU/mL 7.1 8.4    Assessment/Plan:   1. Chronic pain of right knee (Primary) Continue post operative care plan per Orthopedic Team Rest when needed  2. Vitamin D  deficiency Monitor Labs Continue daily OTC Vit D supplementation  3. Insulin  resistance Continue a protein rich diet  4. Obesity, current BMI 31.5  Seth Morales is not currently in the action stage of change. As such, his goal  is to maintain weight for now. He has agreed to practicing portion control and making smarter food choices, such as increasing vegetables and decreasing simple carbohydrates.   Exercise goals: No exercise has been prescribed at this time.  Behavioral modification strategies: increasing lean protein intake, decreasing simple carbohydrates, increasing vegetables, increasing water intake, meal planning and cooking strategies, keeping healthy foods in the home, ways to avoid boredom eating, ways to avoid night time snacking, and planning for success.  Seth Morales has agreed to follow-up with our clinic in 6 weeks. He was informed of the importance of frequent follow-up visits to maximize his success with intensive lifestyle modifications for his multiple health conditions.   Recommend MyChart video visit for next [facility] OV- to reduce discomfort with travel s/p R TKA  Objective:   There were no vitals taken for this visit. There is no height or weight on file to calculate BMI.  General: Cooperative, alert, well developed, in no acute distress. HEENT: Conjunctivae and lids unremarkable. Cardiovascular: Regular rhythm.  Lungs: Normal work of breathing. Neurologic: No focal deficits.   Lab Results  Component Value Date   CREATININE 1.30 (H) 09/05/2023   BUN 34 (H) 09/05/2023   NA 134 (L) 09/05/2023   K 3.6 09/05/2023   CL 99 09/05/2023   CO2 26 09/05/2023   Lab Results  Component Value Date   ALT 28 05/16/2023   AST 19 05/16/2023   ALKPHOS 66 05/16/2023   BILITOT 0.7 05/16/2023   Lab Results  Component Value Date   HGBA1C 6.2 (H) 04/13/2023   HGBA1C 5.7 (H) 06/22/2022   HGBA1C 5.4 09/29/2021   HGBA1C 5.3 05/04/2021   HGBA1C 5.5 06/25/2020   Lab Results  Component Value Date   INSULIN  8.4 06/22/2022   INSULIN  7.1 09/29/2021   INSULIN  12.8 05/04/2021   INSULIN  8.5 06/25/2020   INSULIN  6.0 01/20/2020   Lab Results  Component Value Date   TSH 1.950 04/13/2023   Lab Results   Component Value Date   CHOL 180 06/25/2020   HDL 51 06/25/2020   LDLCALC 109 (H) 06/25/2020   LDLDIRECT 114.0 03/14/2019   TRIG 113 06/25/2020   CHOLHDL 3.4 01/20/2020   Lab Results  Component Value Date   VD25OH 68.5 04/13/2023   VD25OH 54.0 06/22/2022   VD25OH 67.7 09/29/2021   Lab Results  Component Value Date   WBC 8.0 09/05/2023   HGB 14.0 09/05/2023   HCT 41.8 09/05/2023   MCV 90.7 09/05/2023   PLT 208 09/05/2023   Lab Results  Component Value Date   IRON 64 05/16/2023   TIBC 242.2 (L) 05/16/2023   FERRITIN 291.0 05/16/2023     Attestation Statements:   Reviewed by clinician on day of visit: allergies, medications, problem list, medical history, surgical history, family history, social history, and previous encounter notes.  Time spent on visit including pre-visit chart review and post-visit care and charting was 28 minutes.  I have reviewed the above documentation for accuracy and completeness, and I agree with the above. -  Catarina Huntley d. Seth Muns, NP-C

## 2023-10-15 ENCOUNTER — Encounter: Payer: Self-pay | Admitting: Family Medicine

## 2023-11-16 ENCOUNTER — Ambulatory Visit (INDEPENDENT_AMBULATORY_CARE_PROVIDER_SITE_OTHER): Admitting: Adult Health

## 2023-11-24 NOTE — Progress Notes (Signed)
 Seth Morales [facility] [address] [place] [address] Phone: [phone] Subjective:   LILLETTE Seth Morales, am serving as a scribe for Dr. Arthea Claudene.  I'm seeing this patient by the request  of:  Cesario Mutton, MD  CC: bilateral hip pain   YEP:Dlagzrupcz  07/26/2023 Bilateral injections given today, tolerated the procedure well, discussed icing regimen and home exercises.  Discussed which activities to do and which ones to avoid.  Follow-up with me again in 6 to 8 weeks.  Discussed with patient that there is a possibility that some of this is coming from his back.  Some of it is compensating for the right knee that likely is getting a replacement in the near future.  Patient has a single kidney which does make treatment with medications difficult.     Only right shoulder given injection today, tolerated the procedure well, discussed icing regimen and home exercises, patient is likely going to have surgical intervention and a right sided knee replacement in the coming months.  Discussed icing regimen and home exercises.  Increase activity slowly.  Follow-up again in 6 to 8 weeks otherwise.     Updated 11/27/2023 DIETER HANE is a 63 y.o. male coming in with complaint of B shoulder and hip pain. Patient states that he had TKR R on July 8th.   B hip pain especially when sleeping as he sleeps on his side. Pain on R side will radiate down to his knee.   Shoulders have bee doing ok since last visit.        Past Medical History:  Diagnosis Date   Allergy    Anemia    Arthritis    Back pain    Bilateral swelling of feet    Blood transfusion without reported diagnosis 13 years ago    Chronic kidney disease    Dermatitis    History of kidney stones    Hypertension    Joint pain    Kidney transplanted    Polycystic kidney disease    Had transplant   Skin cancer    Vitamin D  deficiency    Past Surgical History:  Procedure Laterality Date   COLONOSCOPY   09/06/2012   at [place]   EYE SURGERY     INSERTION OF DIALYSIS CATHETER     KIDNEY TRANSPLANT  2006   KNEE ARTHROSCOPY Bilateral    MOHS SURGERY     Face   NEPHRECTOMY Bilateral    TOTAL KNEE ARTHROPLASTY Right 09/12/2023   Procedure: ARTHROPLASTY, KNEE, TOTAL;  Surgeon: Sheril Coy, MD;  Location: [facility];  Service: Orthopedics;  Laterality: Right;  TOTAL RIGHT KNEE ATHROPLASTY   Social History   Socioeconomic History   Marital status: Married    Spouse name: Not on file   Number of children: Not on file   Years of education: Not on file   Highest education level: Not on file  Occupational History   Not on file  Tobacco Use   Smoking status: Never    Passive exposure: Never   Smokeless tobacco: Never  Vaping Use   Vaping status: Never Used  Substance and Sexual Activity   Alcohol use: Yes    Alcohol/week: 2.0 standard drinks of alcohol    Types: 2 Standard drinks or equivalent per week    Comment: occasionally   Drug use: No   Sexual activity: Not Currently  Other Topics Concern   Not on file  Social History Narrative  Not on file   Social Drivers of Health   Financial Resource Strain: Not on file  Food Insecurity: No Food Insecurity (09/12/2023)   Hunger Vital Sign    Worried About Running Out of Food in the Last Year: Never true    Ran Out of Food in the Last Year: Never true  Transportation Needs: No Transportation Needs (09/12/2023)   PRAPARE - Administrator, Civil Service (Medical): No    Lack of Transportation (Non-Medical): No  Physical Activity: Not on file  Stress: Not on file  Social Connections: Not on file   No Known Allergies Family History  Problem Relation Age of Onset   Arthritis Mother    Hypertension Mother    Kidney disease Mother    Diabetes Mother    Diabetes Father    Colon cancer Neg Hx    Colon polyps Neg Hx    Esophageal cancer Neg Hx    Rectal cancer Neg Hx    Stomach cancer Neg Hx    Pancreatic cancer Neg  Hx     Current Outpatient Medications (Endocrine & Metabolic):    predniSONE  (DELTASONE ) 5 MG tablet, Take 5 mg by mouth daily with breakfast.  Current Outpatient Medications (Cardiovascular):    hydrochlorothiazide  (HYDRODIURIL ) 25 MG tablet, Take 25 mg by mouth daily.   losartan  (COZAAR ) 50 MG tablet, Take 50 mg by mouth 2 (two) times daily.  Current Outpatient Medications (Respiratory):    cetirizine (ZYRTEC) 10 MG tablet, Take 10 mg by mouth daily.  Current Outpatient Medications (Analgesics):    allopurinol  (ZYLOPRIM ) 100 MG tablet, Take 100 mg by mouth daily.   aspirin  EC 81 MG tablet, Take 1 tablet (81 mg total) by mouth 2 (two) times daily. For 2 weeks then once a day for 2 weeks for dvt prevention.   HYDROcodone -acetaminophen  (NORCO/VICODIN) 5-325 MG tablet, Take 1-2 tablets by mouth every 6 (six) hours as needed for moderate pain (pain score 4-6) or severe pain (pain score 7-10) (post op pain).   Current Outpatient Medications (Other):    Cholecalciferol (VITAMIN D ) 125 MCG (5000 UT) CAPS, Take 5,000 Units by mouth daily.    cycloSPORINE  modified (NEORAL ) 100 MG capsule, Take 100 mg by mouth 2 (two) times daily.   cycloSPORINE  modified (NEORAL ) 25 MG capsule, Take 25 mg by mouth 2 (two) times daily.   desonide (DESOWEN) 0.05 % cream, Apply 1 Application topically 2 (two) times daily.   magnesium  oxide-pyridoxine  (BEELITH) 362-20 MG TABS, Take 1 tablet by mouth daily.   Multiple Vitamin (MULTI-VITAMIN) tablet, Take 1 tablet by mouth daily.   mycophenolate  (CELLCEPT ) 250 MG capsule, Take 750 mg by mouth 2 (two) times daily.   Omega-3 Fatty Acids (FISH OIL PO), Take 1 capsule by mouth daily.   tiZANidine  (ZANAFLEX ) 4 MG tablet, Take 1 tablet (4 mg total) by mouth every 6 (six) hours as needed for muscle spasms.   Reviewed prior external information including notes and imaging from  primary care provider As well as notes that were available from care everywhere and other  healthcare systems.  Past medical history, social, surgical and family history all reviewed in electronic medical record.  No pertanent information unless stated regarding to the chief complaint.   Review of Systems:  No headache, visual changes, nausea, vomiting, diarrhea, constipation, dizziness, abdominal pain, skin rash, fevers, chills, night sweats, weight loss, swollen lymph nodes, body aches, joint swelling, chest pain, shortness of breath, mood changes. POSITIVE muscle aches  Objective  There were no vitals taken for this visit.   General: No apparent distress alert and oriented x3 mood and affect normal, dressed appropriately.  HEENT: Pupils equal, extraocular movements intact  Respiratory: Patient's speak in full sentences and does not appear short of breath  Cardiovascular: No lower extremity edema, non tender, no erythema  Hip exam shows the patient is severely tender to palpation mostly over the greater trochanteric area bilaterally.  Seems to be over the greater trochanteric area bilaterally.  Right greater than left.  Fairly severe, negative straight leg test noted.   After verbal consent patient was prepped with alcohol swab and with a 21-gauge 2 inch needle injected into the right greater trochanteric area with 2 cc of 0.5% Marcaine  and 1 cc of Kenalog  40 mg/mL.  No blood loss.  Band-Aid placed.  Postinjection instructions given   After verbal consent patient was prepped with alcohol swab and with a 21-gauge 2 inch needle injected into the left greater trochanteric area with 2 cc of 0.5% Marcaine  and 1 cc of Kenalog  40 mg/mL.  No blood loss.  Band-Aid placed.  Postinjection instructions given     Impression and Recommendations:    The above documentation has been reviewed and is accurate and complete Ashiyah Pavlak M Kabria Hetzer, DO

## 2023-11-27 ENCOUNTER — Ambulatory Visit (INDEPENDENT_AMBULATORY_CARE_PROVIDER_SITE_OTHER): Admitting: Adult Health

## 2023-11-27 ENCOUNTER — Encounter: Payer: Self-pay | Admitting: Family Medicine

## 2023-11-27 ENCOUNTER — Ambulatory Visit: Admitting: Family Medicine

## 2023-11-27 VITALS — BP 140/82 | HR 69 | Ht 77.0 in

## 2023-11-27 DIAGNOSIS — M7062 Trochanteric bursitis, left hip: Secondary | ICD-10-CM | POA: Diagnosis not present

## 2023-11-27 DIAGNOSIS — M7061 Trochanteric bursitis, right hip: Secondary | ICD-10-CM | POA: Diagnosis not present

## 2023-11-27 NOTE — Assessment & Plan Note (Addendum)
 Bilateral injections given.  Discussed icing regimen and home exercises, discussed which activities to do and things to avoid.  Patient is going to start being significantly more active will work on core strengthening and being active.  Patient will work on some weight loss.  Very happy that patient is doing well with his knee replacement.  Follow-up again 6 to 8 weeks otherwise.

## 2023-12-04 ENCOUNTER — Encounter (INDEPENDENT_AMBULATORY_CARE_PROVIDER_SITE_OTHER): Payer: Self-pay | Admitting: Adult Health

## 2023-12-04 ENCOUNTER — Ambulatory Visit (INDEPENDENT_AMBULATORY_CARE_PROVIDER_SITE_OTHER): Admitting: Adult Health

## 2023-12-04 VITALS — BP 135/87 | HR 80 | Temp 98.1°F | Ht 77.0 in | Wt 261.0 lb

## 2023-12-04 DIAGNOSIS — Q613 Polycystic kidney, unspecified: Secondary | ICD-10-CM | POA: Diagnosis not present

## 2023-12-04 DIAGNOSIS — E66811 Obesity, class 1: Secondary | ICD-10-CM

## 2023-12-04 DIAGNOSIS — Z6830 Body mass index (BMI) 30.0-30.9, adult: Secondary | ICD-10-CM

## 2023-12-04 DIAGNOSIS — E88819 Insulin resistance, unspecified: Secondary | ICD-10-CM

## 2023-12-04 DIAGNOSIS — I10 Essential (primary) hypertension: Secondary | ICD-10-CM

## 2023-12-04 DIAGNOSIS — E559 Vitamin D deficiency, unspecified: Secondary | ICD-10-CM | POA: Diagnosis not present

## 2023-12-04 DIAGNOSIS — E669 Obesity, unspecified: Secondary | ICD-10-CM

## 2023-12-04 NOTE — Progress Notes (Signed)
 WEIGHT SUMMARY AND BIOMETRICS  Vitals Temp: 98.1 F (36.7 C) BP: 135/87 Pulse Rate: 80 SpO2: 95 %   Anthropometric Measurements Height: 6' 5 (1.956 m) Weight: 261 lb (118.4 kg) BMI (Calculated): 30.94 Weight at Last Visit: 266 lb Weight Lost Since Last Visit: 5 lb Weight Gained Since Last Visit: 0 Starting Weight: 285 lb Total Weight Loss (lbs): 24 lb (10.9 kg)   Body Composition  Body Fat %: 36.3 % Fat Mass (lbs): 94.8 lbs Muscle Mass (lbs): 158 lbs Total Body Water (lbs): 128 lbs Visceral Fat Rating : 19   Other Clinical Data Fasting: no Labs: no Today's Visit #: 45 Starting Date: 08/08/23    Chief Complaint:   OBESITY Seth Morales is here to discuss his progress with his obesity treatment plan.  He is on the the Category 3 Plan and states he is following his eating plan approximately 50 % of the time.  He states he is exercising recumbent bike and walking 30 minutes 2 times per week.  Interim History:  He recently completed Out Patient PT several weeks ago. He denies any pain of R knee at present, endorses sense that its swollen.  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  He and his wife will take a long weekend this fall to [place], [place]. (with their sweet dog)  Subjective:   1. Vitamin D  deficiency  Latest Reference Range & Units 09/29/21 09:52 06/22/22 08:07 04/13/23 11:45  Vitamin D , 25-Hydroxy 30.0 - 100.0 ng/mL 67.7 54.0 68.5   He is on daily OTC VitD3 5000 international units   2. Essential hypertension BP stable at [facility] recently stopped hydrochlorothiazide  and started daily Torsemide 20mg   He experienced extreme fatigue with full daily dose of Torsemide 20mg   He has been taking 1/2 tab Torsemide 20mg  every other day. He has been continued on Losartan  50mg  BID  3. Insulin  resistance  Latest Reference Range & Units 05/04/21 11:18 09/29/21 09:52 06/22/22 08:07  INSULIN  2.6 - 24.9  uIU/mL 12.8 7.1 8.4   CKD - would avoid Metformin  Mounjaro /Zepbound  therapy stopped due to SE: hypoglycemia and nausea  4. Polycystic kidney disease Seth Alston, MD - 11/02/2023 11:00 AM EDT Formatting of this note is different from the original. 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  Patient was initially seen in [facility] [place]  where he had his transplant at [facility]. No posttransplant complications or DGF. Was seen at [facility] transplant up until 2014 where patient was lost to follow-up due to lack of insurance coverage. Established follow-up care with [facility] for several years post transplant however like to transfer care to [facility]. 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  He had stable renal function with creatinine 1.3-1.7 post transplant while on losartan . Most recent creatinine at lower limit of baseline 1.2, GFR > 60 mL/min. His electrolytes are within normal limits. PTH 34, vitamin D  70. Hemoglobin stable at 14.1. On triple immunosuppression with cyclosporine  125 mg twice daily, CellCept  750 mg twice daily and prednisone  5 mg daily. Target cyclosporine  trough levels 100-150. Current cyclosporine  level 132 and stable. Most  recent UACR trending down to 124 mg/g creatinine. BK/CMV/EBV viral titers negative last office visit. Blood pressure stable.Norvasc discontinued with improved LE swelling. No uremic or lower urinary tract symptoms. Had right knee surgery at [facility] July 8th.  Has history of PKD with bilateral native nephrectomy posttransplant in 2009  for Persistent gross hematuria. Has significant family history of PKD and mother, brother both status post transplant. No family history of CVA, sudden death or brain aneurysms.   Assessment/Plan:   1. Vitamin D  deficiency Continue daily OTC Vit D 3 5000  Monitor labs Q2-67M  2. Essential hypertension Continue healthy eating and regular cardiovascular exercise  3. Insulin  resistance Continue healthy eating and regular cardiovascular exercise  4. Polycystic kidney disease Plan:  -Creatinine stable and within baseline range. -Cyclosporine  level at goal, continue triple immunosuppression. -Phos, bicarb, PTH, vitamin D , hemoglobin at goal for CKD stage. -BP well controlled. -Continue losartan  50mg  bid. -Emphasized the importance of strict low-sodium diet. -Will switch hydrochlorothiazide  to torsemide 20mg  daily. -Return to clinic in 6 months.  Orders Placed This Encounter  Medications  torsemide (DEMADEX) 20 mg tablet  Sig: Take 1 tablet (20 mg total) by mouth daily.  Dispense: 30 tablet  Refill: 6   He is currently on Losartan  50mg  BID and he recently reduced Torsemide 20mg  1/2 tab EVERY OTHER DAY  5. Obesity, current BMI 30.9  Seth Morales is currently in the action stage of change. As such, his goal is to continue with weight loss efforts. He has agreed to the Category 3 Plan.   Exercise goals: All adults should avoid inactivity. Some physical activity is better than none, and adults who participate in any amount of physical activity gain some health benefits. Adults should also include muscle-strengthening activities that involve all major muscle groups on 2 or more days a week.  Behavioral modification strategies: increasing lean protein intake, decreasing simple carbohydrates, increasing vegetables, increasing water intake, meal planning and cooking strategies, keeping healthy foods in the home, ways to avoid boredom eating, ways to avoid night time snacking, and planning for  success.  Seth Morales has agreed to follow-up with our clinic in 4 weeks. He was informed of the importance of frequent follow-up visits to maximize his success with intensive lifestyle modifications for his multiple health conditions.   Objective:   Blood pressure 135/87, pulse 80, temperature 98.1 F (36.7 C), height 6' 5 (1.956 m), weight 261 lb (118.4 kg), SpO2 95%. Body mass index is 30.95 kg/m.  General: Cooperative, alert, well developed, in no acute distress. HEENT: Conjunctivae and lids unremarkable. Cardiovascular: Regular rhythm.  Lungs: Normal work of breathing. Neurologic: No focal deficits.   Lab Results  Component Value Date   CREATININE 1.30 (H) 09/05/2023   BUN 34 (H) 09/05/2023   NA 134 (L) 09/05/2023   K 3.6 09/05/2023   CL 99 09/05/2023   CO2 26 09/05/2023   Lab Results  Component Value Date   ALT 28 05/16/2023   AST 19 05/16/2023   ALKPHOS 66 05/16/2023   BILITOT 0.7 05/16/2023   Lab Results  Component Value Date   HGBA1C 6.2 (H) 04/13/2023   HGBA1C 5.7 (H) 06/22/2022   HGBA1C 5.4 09/29/2021   HGBA1C 5.3 05/04/2021   HGBA1C 5.5 06/25/2020   Lab Results  Component Value Date   INSULIN  8.4 06/22/2022   INSULIN  7.1 09/29/2021   INSULIN  12.8 05/04/2021   INSULIN  8.5 06/25/2020   INSULIN  6.0 01/20/2020   Lab Results  Component Value Date  TSH 1.950 04/13/2023   Lab Results  Component Value Date   CHOL 180 06/25/2020   HDL 51 06/25/2020   LDLCALC 109 (H) 06/25/2020   LDLDIRECT 114.0 03/14/2019   TRIG 113 06/25/2020   CHOLHDL 3.4 01/20/2020   Lab Results  Component Value Date   VD25OH 68.5 04/13/2023   VD25OH 54.0 06/22/2022   VD25OH 67.7 09/29/2021   Lab Results  Component Value Date   WBC 8.0 09/05/2023   HGB 14.0 09/05/2023   HCT 41.8 09/05/2023   MCV 90.7 09/05/2023   PLT 208 09/05/2023   Lab Results  Component Value Date   IRON 64 05/16/2023   TIBC 242.2 (L) 05/16/2023   FERRITIN 291.0 05/16/2023   Attestation  Statements:   Reviewed by clinician on day of visit: allergies, medications, problem list, medical history, surgical history, family history, social history, and previous encounter notes.  Time spent on visit including pre-visit chart review and post-visit care and charting was 25 minutes.   I have reviewed the above documentation for accuracy and completeness, and I agree with the above. -  Brittinee Risk d. Shakeria Robinette, NP-C

## 2023-12-14 ENCOUNTER — Ambulatory Visit (INDEPENDENT_AMBULATORY_CARE_PROVIDER_SITE_OTHER): Admitting: Adult Health

## 2023-12-25 ENCOUNTER — Encounter (INDEPENDENT_AMBULATORY_CARE_PROVIDER_SITE_OTHER): Payer: Self-pay | Admitting: Adult Health

## 2023-12-25 ENCOUNTER — Ambulatory Visit (INDEPENDENT_AMBULATORY_CARE_PROVIDER_SITE_OTHER): Admitting: Adult Health

## 2023-12-25 VITALS — BP 139/87 | HR 84 | Temp 98.7°F | Ht 77.0 in | Wt 268.0 lb

## 2023-12-25 DIAGNOSIS — Z6833 Body mass index (BMI) 33.0-33.9, adult: Secondary | ICD-10-CM

## 2023-12-25 DIAGNOSIS — Q613 Polycystic kidney, unspecified: Secondary | ICD-10-CM

## 2023-12-25 DIAGNOSIS — E669 Obesity, unspecified: Secondary | ICD-10-CM | POA: Diagnosis not present

## 2023-12-25 DIAGNOSIS — E559 Vitamin D deficiency, unspecified: Secondary | ICD-10-CM | POA: Diagnosis not present

## 2023-12-25 DIAGNOSIS — I10 Essential (primary) hypertension: Secondary | ICD-10-CM

## 2023-12-25 DIAGNOSIS — Z6831 Body mass index (BMI) 31.0-31.9, adult: Secondary | ICD-10-CM

## 2023-12-25 NOTE — Progress Notes (Addendum)
 WEIGHT SUMMARY AND BIOMETRICS  Vitals Temp: 98.7 F (37.1 C) BP: 139/87 Pulse Rate: 84 SpO2: 95 %   Anthropometric Measurements Height: 6' 5 (1.956 m) Weight: 268 lb (121.6 kg) BMI (Calculated): 31.77 Weight at Last Visit: 261 lb Weight Lost Since Last Visit: 0 lb Weight Gained Since Last Visit: 7 lb Starting Weight: 285 lb Total Weight Loss (lbs): 17 lb (7.711 kg)   Body Composition  Body Fat %: 36.8 % Fat Mass (lbs): 98.6 lbs Muscle Mass (lbs): 161 lbs Total Body Water (lbs): 131.2 lbs Visceral Fat Rating : 20   Other Clinical Data Fasting: no Labs: no Today's Visit #: 79 Starting Date: 05/22/19    Chief Complaint:   OBESITY Seth Morales is here to discuss his progress with his obesity treatment plan.  He is on the the Category 3 Plan and states he is following his eating plan approximately 25 % of the time.  He states he is exercising walking/stationery bike 30 minutes 3 times per week.  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 Cesario  He recently travelled to [place] with his wife and their dog- frequent walking  He has been experiencing nasal congestion and non-productive cough the last several days. He denies dyspnea, fever, chills, or GI upset  Reviewed Bioimpedance Results with pt: Muscle Mass: +3 lbs Adipose Mass: +3.8 lbs  Subjective:   1. Essential hypertension PCP is considering starting him on statin therapy, will determine after next round of fasting labs He is unsure of family hx of HLD He denies tobacco/vape use He has not been regularly checking home BP He denies CP with exertion He denies tobacco/vape use He is on: losartan  (COZAAR ) 50 MG tablet  torsemide (DEMADEX) 10 MG tablet   2. Polycystic kidney disease [facility] BP stable at OV He has not been regularly checking home BP He denies CP with exertion He denies tobacco/vape use  3. Vitamin D  deficiency  Latest Reference Range & Units 09/29/21 09:52 06/22/22 08:07 04/13/23 11:45  Vitamin D , 25-Hydroxy 30.0 - 100.0 ng/mL 67.7 54.0 68.5   Vit D Level stable He is on daily OTC Vit D3 5000 international units   Assessment/Plan:   1. Essential hypertension Continue healthy eating and regular cardiovascular exercise Continue losartan  (COZAAR ) 50 MG tablet  torsemide (DEMADEX) 10 MG tablet   2. Polycystic kidney disease Continue close f/u with Nephrologist Continue losartan  (COZAAR ) 50 MG tablet  torsemide (DEMADEX) 10 MG tablet   3. Vitamin D  deficiency (Primary) Continue daily OTC Vit D3 5000 international units   4. Obesity, current BMI 31.8  Eudell is not currently in the action stage of change. As such, his goal is to get back to weightloss efforts . He has agreed to the Category 3 Plan.   Exercise goals: All adults should avoid inactivity. Some physical activity is better than none, and adults who participate in any amount of physical activity gain some health benefits. Adults should also include muscle-strengthening activities that involve all major muscle groups on 2 or more days a week. Stationary BIke  Behavioral modification strategies: increasing lean protein intake, decreasing simple carbohydrates, increasing vegetables, increasing water intake, decreasing eating out, no skipping meals, meal planning and cooking strategies, keeping  healthy foods in the home, ways to avoid boredom eating, planning for success, and decreasing junk food.  Kalob has agreed to follow-up with our clinic in 4 weeks. He was informed of the importance of frequent follow-up visits to maximize his success with intensive lifestyle modifications for his multiple health conditions.   Check Fasting labs early Winter 2025  Objective:   Blood  pressure 139/87, pulse 84, temperature 98.7 F (37.1 C), height 6' 5 (1.956 m), weight 268 lb (121.6 kg), SpO2 95%. Body mass index is 31.78 kg/m.  General: Cooperative, alert, well developed, in no acute distress. HEENT: Conjunctivae and lids unremarkable. Cardiovascular: Regular rhythm.  Lungs: Normal work of breathing. Neurologic: No focal deficits.   Lab Results  Component Value Date   CREATININE 1.30 (H) 09/05/2023   BUN 34 (H) 09/05/2023   NA 134 (L) 09/05/2023   K 3.6 09/05/2023   CL 99 09/05/2023   CO2 26 09/05/2023   Lab Results  Component Value Date   ALT 28 05/16/2023   AST 19 05/16/2023   ALKPHOS 66 05/16/2023   BILITOT 0.7 05/16/2023   Lab Results  Component Value Date   HGBA1C 6.2 (H) 04/13/2023   HGBA1C 5.7 (H) 06/22/2022   HGBA1C 5.4 09/29/2021   HGBA1C 5.3 05/04/2021   HGBA1C 5.5 06/25/2020   Lab Results  Component Value Date   INSULIN  8.4 06/22/2022   INSULIN  7.1 09/29/2021   INSULIN  12.8 05/04/2021   INSULIN  8.5 06/25/2020   INSULIN  6.0 01/20/2020   Lab Results  Component Value Date   TSH 1.950 04/13/2023   Lab Results  Component Value Date   CHOL 180 06/25/2020   HDL 51 06/25/2020   LDLCALC 109 (H) 06/25/2020   LDLDIRECT 114.0 03/14/2019   TRIG 113 06/25/2020   CHOLHDL 3.4 01/20/2020   Lab Results  Component Value Date   VD25OH 68.5 04/13/2023   VD25OH 54.0 06/22/2022   VD25OH 67.7 09/29/2021   Lab Results  Component Value Date   WBC 8.0 09/05/2023   HGB 14.0 09/05/2023   HCT 41.8 09/05/2023   MCV 90.7 09/05/2023   PLT 208 09/05/2023   Lab Results  Component Value Date   IRON 64 05/16/2023   TIBC 242.2 (L) 05/16/2023   FERRITIN 291.0 05/16/2023    Attestation Statements:   Reviewed by clinician on day of visit: allergies, medications, problem list, medical history, surgical history, family history, social history, and previous encounter notes.  I have reviewed the above documentation for accuracy and completeness,  and I agree with the above. -  Marv Alfrey d. Haiven Nardone, NP-C

## 2024-01-22 ENCOUNTER — Encounter (INDEPENDENT_AMBULATORY_CARE_PROVIDER_SITE_OTHER): Payer: Self-pay | Admitting: Adult Health

## 2024-01-22 ENCOUNTER — Ambulatory Visit (INDEPENDENT_AMBULATORY_CARE_PROVIDER_SITE_OTHER): Payer: Self-pay | Admitting: Adult Health

## 2024-01-22 VITALS — BP 138/80 | HR 95 | Temp 98.0°F | Ht 77.0 in | Wt 268.0 lb

## 2024-01-22 DIAGNOSIS — E669 Obesity, unspecified: Secondary | ICD-10-CM

## 2024-01-22 DIAGNOSIS — Z6831 Body mass index (BMI) 31.0-31.9, adult: Secondary | ICD-10-CM

## 2024-01-22 DIAGNOSIS — Z6833 Body mass index (BMI) 33.0-33.9, adult: Secondary | ICD-10-CM

## 2024-01-22 DIAGNOSIS — E559 Vitamin D deficiency, unspecified: Secondary | ICD-10-CM

## 2024-01-22 DIAGNOSIS — E88819 Insulin resistance, unspecified: Secondary | ICD-10-CM | POA: Diagnosis not present

## 2024-01-22 DIAGNOSIS — J209 Acute bronchitis, unspecified: Secondary | ICD-10-CM

## 2024-01-22 DIAGNOSIS — I10 Essential (primary) hypertension: Secondary | ICD-10-CM | POA: Diagnosis not present

## 2024-01-22 DIAGNOSIS — Q613 Polycystic kidney, unspecified: Secondary | ICD-10-CM | POA: Diagnosis not present

## 2024-01-22 MED ORDER — ZEPBOUND 2.5 MG/0.5ML ~~LOC~~ SOAJ: 2.5000 mg | SUBCUTANEOUS | 0 refills | Status: AC

## 2024-01-22 NOTE — Progress Notes (Signed)
 WEIGHT SUMMARY AND BIOMETRICS  Vitals Temp: 98 F (36.7 C) BP: 138/80 Pulse Rate: 95 SpO2: 99 %   Anthropometric Measurements Height: 6' 5 (1.956 m) Weight: 268 lb (121.6 kg) BMI (Calculated): 31.77 Weight at Last Visit: 268lb Weight Lost Since Last Visit: 0lb Weight Gained Since Last Visit: 0lb Starting Weight: 285lb Total Weight Loss (lbs): 17 lb (7.711 kg)   Body Composition  Body Fat %: 36.3 % Fat Mass (lbs): 97.4 lbs Muscle Mass (lbs): 162.4 lbs Total Body Water (lbs): 132.8 lbs Visceral Fat Rating : 20   Other Clinical Data Fasting: No Labs: No Today's Visit #: 62 Starting Date: 05/22/19    Chief Complaint:   OBESITY Seth Morales is here to discuss his progress with his obesity treatment plan.  He is on the the Category 3 Plan and states he is following his eating plan approximately 60 % of the time.  He states he is exercising Yard Work/Walking 60 minutes 3 times per week.  Interim History:  Reviewed [employer] article that examined the use of GIP/GLP-1 to treat CKD/PKD  He is followed by Nephrologist Q6M/Adetoye Lufadeju, MD at   Reviewed Bioimpedance Results with pt: Muscle Mass: +1.4 lbs Adipose Mass: -1.2 lbs  Subjective:   1. Essential hypertension BP stable at OV Nephrology Manages: losartan  (COZAAR ) 50 MG tablet BID   torsemide (DEMADEX) 10 MG tablet 1/2 tab every other day- did take this morning   2. Polycystic kidney disease  Latest Reference Range & Units 04/19/23 22:12 05/16/23 16:16 09/05/23 13:29  Creatinine 0.61 - 1.24 mg/dL 8.67 (H) 8.47 (H) 8.69 (H)  (H): Data is abnormally high  [name], MD - 11/02/2023 11:00 AM EDT Formatting of this note is different from the original. 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  Patient was initially seen in [facility]  where he had his transplant at [facility]. No posttransplant complications or DGF. Was seen at [facility] transplant up until 2014 where patient was lost to follow-up due to lack of insurance coverage. Established follow-up care with [facility] for several years post transplant however like to transfer care to [facility]. 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  He had stable renal function with creatinine 1.3-1.7 post transplant while on losartan . Most recent creatinine at lower limit of baseline 1.2, GFR > 60 mL/min. His electrolytes are within normal limits. PTH 34, vitamin D  70. Hemoglobin stable at 14.1. On triple immunosuppression with cyclosporine  125 mg twice daily, CellCept  750 mg twice daily and prednisone  5 mg daily. Target cyclosporine  trough levels 100-150. Current cyclosporine  level 132 and stable. Most recent UACR trending down to 124 mg/g creatinine. BK/CMV/EBV viral titers negative last office visit. Blood pressure stable.Norvasc discontinued with improved LE swelling. No uremic or lower urinary tract symptoms. Had right knee surgery at [facility] hospital July 8th.  Has history of PKD with bilateral native nephrectomy posttransplant in 2009 for Persistent gross hematuria. Has significant family history of PKD and mother, brother both status post transplant. No family history of CVA, sudden death or brain aneurysms.    HWW- He denies family hx of MENS 2 or MTC He denies personal  of pancreatitis He has been on Mounjaro  in past- tolerated lower doses of GIP/GLP-1 therapy  3. Vitamin D  deficiency  Latest Reference Range & Units 09/29/21 09:52 06/22/22 08:07 04/13/23 11:45  Vitamin D , 25-Hydroxy 30.0 - 100.0 ng/mL 67.7 54.0  68.5   Vit D Level stable  4. Insulin  resistance  Latest Reference Range & Units 09/29/21 09:52 06/22/22 08:07  INSULIN  2.6 - 24.9 uIU/mL 7.1 8.4   Insulin  level just above goal of 5  He denies family hx of MENS 2 or MTC He denies personal of pancreatitis He has been on Mounjaro  in past- tolerated lower doses of GIP/GLP-1 therapy  5. Acute bronchitis, unspecified organism [facility] 01/08/2024  History of Present Illness The patient is a 63 year old male who presents for a persistent cough.  Approximately 4 weeks ago, he contracted a cold from a colleague. Despite the passage of time, his symptoms have not fully resolved. He experiences coughing fits at night, characterized by a dry, hacking cough, which he describes as feeling like a speck of dust in his throat. 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 He does not experience wheezing or shortness of breath but reports a tickling sensation in his lungs, which can trigger coughing fits even without inhalation. Occasionally, he coughs up yellowish phlegm in the morning. He has not had a fever and, apart from the cough, does not feel unwell. Initially, he experienced postnasal drip and a sore throat upon waking, but these symptoms have since resolved. He also reports a runny nose, which seems to worsen after sunset. His symptoms do not appear to be exacerbated by lying down. He has not vomited after coughing fits and does not experience chest tightness or wheezing. He is able to take deep breaths when not coughing. His condition improved for a couple of weeks but has remained stable for the past few weeks. He estimates a 75% improvement from the onset of symptoms.  He has not previously taken Tessalon Perles or used an inhaler. He sought medical attention from his PCP on 12/15/2023, who recommended over-the-counter  medications and advised him to return if his condition did not improve within 3 weeks. He has been self-medicating with Mucinex  DM and other over-the-counter cough remedies, but these have provided minimal relief.  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 Essential hypertension (Primary) Limit Na+ Continue current antihypertensives per Nephrology  2. Polycystic kidney disease 11/02/2023 Nephrology OV Notes: Plan:  -Creatinine stable and within baseline range. -Cyclosporine  level at goal, continue triple immunosuppression. -Phos, bicarb, PTH, vitamin D , hemoglobin at goal for CKD stage. -BP well controlled. -Continue losartan  50mg  bid. -Emphasized the importance of strict low-sodium diet. -Will switch hydrochlorothiazide  to torsemide 20mg  daily. -Return to clinic in 6 months.   Start tirzepatide  (ZEPBOUND ) 2.5 MG/0.5ML Pen Inject 2.5 mg into the skin once a week. Dispense: 2 mL, Refills: 0 ordered   3. Vitamin D  deficiency Monitor Labs  4. Insulin  resistance Limit sugar/simple CHO Continue to increase regular cardiovascular exercise  5. Acute bronchitis, unspecified organism [facility] 01/08/2024 Diagnosis and Medical Decision Making  1. Bronchitis (Primary) - albuterol  HFA (PROVENTIL  HFA;VENTOLIN  HFA;PROAIR  HFA) 90 mcg/actuation inhaler; Inhale 2 puffs every 6 (six) hours as needed for wheezing or shortness of breath. Dispense: 8 g; Refill: 0  2. Subacute cough - benzonatate (TESSALON) 100 mg capsule; Take 1-2 capsules (100-200 mg total) by mouth 3 (three) times a day as needed for cough. Dispense: 30 capsule; Refill: 0  Assessment & Plan 1-2. Bronchitis: Persistent. - Given cough with improvement of other initial respiratory symptoms, suspect bronchitis secondary to initial viral illness. Significant improvement compared to onset with  persistent cough. No indications for antibiotic at this time. - Tessalon Perles will be prescribed, with instructions to take 1 to 2 tablets three times daily as needed for additional cough management. - Albuterol  inhaler will be prescribed, with instructions to administer 2 puffs every 6 hours as needed. Educated on the proper use of the inhaler. - Follow-up as needed for new or worsening symptoms or if no improvement in the next several days.  Follow-up - Follow-up as needed for new or worsening symptoms or if no improvement in the next several days. - Follow up with PCP as previously scheduled on 06/14/2024.   6. Obesity, current BMI 31.8 Start tirzepatide  (ZEPBOUND ) 2.5 MG/0.5ML Pen Inject 2.5 mg into the skin once a week. Dispense: 2 mL, Refills: 0 ordered   Seth Morales is currently in the action stage of change. As such, his goal is to continue with weight loss efforts. He has agreed to the Category 3 Plan.   Exercise goals: All adults should avoid inactivity. Some physical activity is better than none, and adults who participate in any amount of physical activity gain some health benefits. Adults should also include muscle-strengthening activities that involve all major muscle groups on 2 or more days a week.  Behavioral modification strategies: increasing lean protein intake, decreasing simple carbohydrates, increasing vegetables, increasing water intake, no skipping meals, meal planning and cooking strategies, keeping healthy foods in the home, ways to avoid boredom eating, and planning for success.  Bassel has agreed to follow-up with our clinic in 4 weeks. He was informed of the importance of frequent follow-up visits to maximize his success with intensive lifestyle modifications for his multiple health conditions.   Check Fasting Labs at next OV  Objective:   Blood pressure 138/80, pulse 95, temperature 98 F (36.7 C), height 6' 5 (1.956 m), weight 268 lb (121.6 kg), SpO2  99%. Body mass index is 31.78 kg/m.  General: Cooperative, alert, well developed, in no acute distress. HEENT: Conjunctivae and lids unremarkable. Cardiovascular: Regular rhythm.  Lungs: Normal work of breathing. Neurologic: No focal deficits.   Lab Results  Component Value Date   CREATININE 1.30 (H) 09/05/2023   BUN 34 (H) 09/05/2023   NA 134 (L) 09/05/2023   K 3.6 09/05/2023   CL 99 09/05/2023   CO2 26 09/05/2023   Lab Results  Component Value Date   ALT 28 05/16/2023   AST 19 05/16/2023   ALKPHOS 66 05/16/2023   BILITOT 0.7 05/16/2023   Lab Results  Component Value Date   HGBA1C 6.2 (H) 04/13/2023   HGBA1C 5.7 (H) 06/22/2022   HGBA1C 5.4 09/29/2021   HGBA1C 5.3 05/04/2021   HGBA1C 5.5 06/25/2020   Lab Results  Component Value Date   INSULIN  8.4 06/22/2022   INSULIN  7.1 09/29/2021   INSULIN  12.8 05/04/2021   INSULIN  8.5 06/25/2020   INSULIN  6.0 01/20/2020   Lab Results  Component Value Date   TSH 1.950 04/13/2023   Lab Results  Component Value Date   CHOL 180 06/25/2020   HDL 51 06/25/2020   LDLCALC 109 (H) 06/25/2020   LDLDIRECT 114.0 03/14/2019   TRIG 113  06/25/2020   CHOLHDL 3.4 01/20/2020   Lab Results  Component Value Date   VD25OH 68.5 04/13/2023   VD25OH 54.0 06/22/2022   VD25OH 67.7 09/29/2021   Lab Results  Component Value Date   WBC 8.0 09/05/2023   HGB 14.0 09/05/2023   HCT 41.8 09/05/2023   MCV 90.7 09/05/2023   PLT 208 09/05/2023   Lab Results  Component Value Date   IRON 64 05/16/2023   TIBC 242.2 (L) 05/16/2023   FERRITIN 291.0 05/16/2023   Attestation Statements:   Reviewed by clinician on day of visit: allergies, medications, problem list, medical history, surgical history, family history, social history, and previous encounter notes.  I have reviewed the above documentation for accuracy and completeness, and I agree with the above. -  Rj Pedrosa d. Franki Stemen, NP-C

## 2024-01-23 ENCOUNTER — Telehealth (INDEPENDENT_AMBULATORY_CARE_PROVIDER_SITE_OTHER): Payer: Self-pay

## 2024-01-23 NOTE — Telephone Encounter (Signed)
Message from Plan  Your PA has been resolved, no additional PA is required. For further inquiries please contact the number on the back of the member prescription card.

## 2024-01-30 NOTE — Progress Notes (Signed)
 [name] [address] [place] [address] Phone: [phone] Subjective:   I, Claretha Schimke am a scribe for Dr. Claudene,.   I'm seeing this patient by the request  of:  Cesario Mutton, MD  CC:  Bilateral hip pain YEP:Dlagzrupcz  11/27/2023 Bilateral injections given.  Discussed icing regimen and home exercises, discussed which activities to do and things to avoid.  Patient is going to start being significantly more active will work on core strengthening and being active.  Patient will work on some weight loss.  Very happy that patient is doing well with his knee replacement.  Follow-up again 6 to 8 weeks otherwise.      Update 01/31/2024 AMRIT CRESS is a 63 y.o. male coming in with complaint of B hip pain. R TKR July 8th, 2025. Patient states that the hips are ok. They get a little sore at night.       Past Medical History:  Diagnosis Date   Allergy    Anemia    Arthritis    Back pain    Bilateral swelling of feet    Blood transfusion without reported diagnosis 13 years ago    Chronic kidney disease    Dermatitis    History of kidney stones    Hypertension    Joint pain    Kidney transplanted    Polycystic kidney disease    Had transplant   Skin cancer    Vitamin D  deficiency    Past Surgical History:  Procedure Laterality Date   COLONOSCOPY  09/06/2012   at [place]   EYE SURGERY     INSERTION OF DIALYSIS CATHETER     KIDNEY TRANSPLANT  2006   KNEE ARTHROSCOPY Bilateral    MOHS SURGERY     Face   NEPHRECTOMY Bilateral    TOTAL KNEE ARTHROPLASTY Right 09/12/2023   Procedure: ARTHROPLASTY, KNEE, TOTAL;  Surgeon: Sheril Coy, MD;  Location: [facility];  Service: Orthopedics;  Laterality: Right;  TOTAL RIGHT KNEE ATHROPLASTY   Social History   Socioeconomic History   Marital status: Married    Spouse name: Not on file   Number of children: Not on file   Years of education: Not on file   Highest education level: Not  on file  Occupational History   Not on file  Tobacco Use   Smoking status: Never    Passive exposure: Never   Smokeless tobacco: Never  Vaping Use   Vaping status: Never Used  Substance and Sexual Activity   Alcohol use: Yes    Alcohol/week: 2.0 standard drinks of alcohol    Types: 2 Standard drinks or equivalent per week    Comment: occasionally   Drug use: No   Sexual activity: Not Currently  Other Topics Concern   Not on file  Social History Narrative   Not on file   Social Drivers of Health   Financial Resource Strain: Not on file  Food Insecurity: No Food Insecurity (09/12/2023)   Hunger Vital Sign    Worried About Running Out of Food in the Last Year: Never true    Ran Out of Food in the Last Year: Never true  Transportation Needs: No Transportation Needs (09/12/2023)   PRAPARE - Administrator, Civil Service (Medical): No    Lack of Transportation (Non-Medical): No  Physical Activity: Not on file  Stress: Not on file  Social Connections: Not on file   No Known Allergies Family  History  Problem Relation Age of Onset   Arthritis Mother    Hypertension Mother    Kidney disease Mother    Diabetes Mother    Diabetes Father    Colon cancer Neg Hx    Colon polyps Neg Hx    Esophageal cancer Neg Hx    Rectal cancer Neg Hx    Stomach cancer Neg Hx    Pancreatic cancer Neg Hx     Current Outpatient Medications (Endocrine & Metabolic):    predniSONE  (DELTASONE ) 5 MG tablet, Take 5 mg by mouth daily with breakfast.  Current Outpatient Medications (Cardiovascular):    losartan  (COZAAR ) 50 MG tablet, Take 50 mg by mouth 2 (two) times daily.   torsemide (DEMADEX) 10 MG tablet, Take 10 mg by mouth.  Current Outpatient Medications (Respiratory):    albuterol  (VENTOLIN  HFA) 108 (90 Base) MCG/ACT inhaler, Inhale 2 puffs into the lungs.   cetirizine (ZYRTEC) 10 MG tablet, Take 10 mg by mouth daily.  Current Outpatient Medications (Analgesics):    allopurinol   (ZYLOPRIM ) 100 MG tablet, Take 100 mg by mouth daily.   HYDROcodone -acetaminophen  (NORCO/VICODIN) 5-325 MG tablet, Take 1-2 tablets by mouth every 6 (six) hours as needed for moderate pain (pain score 4-6) or severe pain (pain score 7-10) (post op pain).   Current Outpatient Medications (Other):    amoxicillin (AMOXIL) 500 MG capsule, Take 500 mg by mouth.   Cholecalciferol (VITAMIN D ) 125 MCG (5000 UT) CAPS, Take 5,000 Units by mouth daily.    cycloSPORINE  modified (NEORAL ) 100 MG capsule, Take 100 mg by mouth 2 (two) times daily.   cycloSPORINE  modified (NEORAL ) 25 MG capsule, Take 25 mg by mouth 2 (two) times daily.   hydrocortisone  2.5 % cream, Apply 1 Application topically.   ketoconazole (NIZORAL) 2 % cream, Apply twice a day to affected areas   magnesium  oxide-pyridoxine  (BEELITH) 362-20 MG TABS, Take 1 tablet by mouth daily.   Multiple Vitamin (MULTI-VITAMIN) tablet, Take 1 tablet by mouth daily.   mycophenolate  (CELLCEPT ) 250 MG capsule, Take 750 mg by mouth 2 (two) times daily.   Omega-3 Fatty Acids (FISH OIL PO), Take 1 capsule by mouth daily.   tirzepatide  (ZEPBOUND ) 2.5 MG/0.5ML Pen, Inject 2.5 mg into the skin once a week.   Reviewed prior external information including notes and imaging from  primary care provider As well as notes that were available from care everywhere and other healthcare systems.  Past medical history, social, surgical and family history all reviewed in electronic medical record.  No pertanent information unless stated regarding to the chief complaint.   Review of Systems:  No headache, visual changes, nausea, vomiting, diarrhea, constipation, dizziness, abdominal pain, skin rash, fevers, chills, night sweats, weight loss, swollen lymph nodes, body aches, joint swelling, chest pain, shortness of breath, mood changes. POSITIVE muscle aches  Objective  Blood pressure (!) 154/80, pulse 86, height 6' 5 (1.956 m), weight 282 lb (127.9 kg), SpO2 98%.    General: No apparent distress alert and oriented x3 mood and affect normal, dressed appropriately.  HEENT: Pupils equal, extraocular movements intact  Respiratory: Patient's speak in full sentences and does not appear short of breath  Cardiovascular: No lower extremity edema, non tender, no erythema  Knee replacement on the right side.  Tender to palpation over the greater trochanteric areas.   After verbal consent patient was prepped with alcohol swab and with a 21-gauge 2 inch needle injected into the right greater trochanteric area with 2 cc of 0.5%  Marcaine  and 1 cc of Kenalog  40 mg/mL.  No blood loss.  Band-Aid placed.  Postinjection instructions given  After verbal consent patient was prepped in sterile fashion with alcohol swabs . Ethyl chloride used patient was injected with a 22-gauge 3 inch needle into the left lateral hip in the greater trochanteric area under ultrasound guidance. Picture was taken. Patient had 4 cc of 0.5% Marcaine  and 1 cc of Kenalog  40 mg/dL injected. Patient tolerated the procedure well and no blood loss. Pain completely resolved after injection stating proper placement. Post injection instructions given.    Impression and Recommendations:     The above documentation has been reviewed and is accurate and complete Calli Bashor M Nikolai Wilczak, DO

## 2024-02-05 ENCOUNTER — Encounter: Payer: Self-pay | Admitting: Family Medicine

## 2024-02-05 ENCOUNTER — Ambulatory Visit: Admitting: Family Medicine

## 2024-02-05 VITALS — BP 154/80 | HR 86 | Ht 77.0 in | Wt 282.0 lb

## 2024-02-05 DIAGNOSIS — M7061 Trochanteric bursitis, right hip: Secondary | ICD-10-CM | POA: Diagnosis not present

## 2024-02-05 DIAGNOSIS — M7062 Trochanteric bursitis, left hip: Secondary | ICD-10-CM | POA: Diagnosis not present

## 2024-02-05 NOTE — Assessment & Plan Note (Signed)
 Bilateral injection given difficulty.  Discussed icing regimen and home exercises, discussed which activities to do and which ones to avoid.  Increase activity slowly.  Patient has lost weight which I think will be significantly beneficial.  Follow-up again in 6 to 12 weeks

## 2024-02-05 NOTE — Patient Instructions (Addendum)
 Good to see you. Injected hips today. See me again in 10 weeks.

## 2024-02-26 ENCOUNTER — Ambulatory Visit (INDEPENDENT_AMBULATORY_CARE_PROVIDER_SITE_OTHER): Admitting: Adult Health

## 2024-04-01 ENCOUNTER — Ambulatory Visit (INDEPENDENT_AMBULATORY_CARE_PROVIDER_SITE_OTHER): Admitting: Adult Health

## 2024-04-12 NOTE — Progress Notes (Unsigned)
 " [facility] [address] [place] [address] Phone: [phone] Subjective:    I'm seeing this patient by the request  of:  Cesario Mutton, MD  CC:   YEP:Dlagzrupcz  02/05/2024 Bilateral injection given difficulty.  Discussed icing regimen and home exercises, discussed which activities to do and which ones to avoid.  Increase activity slowly.  Patient has lost weight which I think will be significantly beneficial.  Follow-up again in 6 to 12 weeks      Update 04/15/2024 Seth Morales is a 64 y.o. male coming in with complaint of B hip pain. Patient states       Past Medical History:  Diagnosis Date   Allergy    Anemia    Arthritis    Back pain    Bilateral swelling of feet    Blood transfusion without reported diagnosis 13 years ago    Chronic kidney disease    Dermatitis    History of kidney stones    Hypertension    Joint pain    Kidney transplanted    Polycystic kidney disease    Had transplant   Skin cancer    Vitamin D  deficiency    Past Surgical History:  Procedure Laterality Date   COLONOSCOPY  09/06/2012   at [place]   EYE SURGERY     INSERTION OF DIALYSIS CATHETER     KIDNEY TRANSPLANT  2006   KNEE ARTHROSCOPY Bilateral    MOHS SURGERY     Face   NEPHRECTOMY Bilateral    TOTAL KNEE ARTHROPLASTY Right 09/12/2023   Procedure: ARTHROPLASTY, KNEE, TOTAL;  Surgeon: Sheril Coy, MD;  Location: [facility];  Service: Orthopedics;  Laterality: Right;  TOTAL RIGHT KNEE ATHROPLASTY   Social History   Socioeconomic History   Marital status: Married    Spouse name: Not on file   Number of children: Not on file   Years of education: Not on file   Highest education level: Not on file  Occupational History   Not on file  Tobacco Use   Smoking status: Never    Passive exposure: Never   Smokeless tobacco: Never  Vaping Use   Vaping status: Never Used  Substance and Sexual Activity   Alcohol use: Yes    Alcohol/week:  2.0 standard drinks of alcohol    Types: 2 Standard drinks or equivalent per week    Comment: occasionally   Drug use: No   Sexual activity: Not Currently  Other Topics Concern   Not on file  Social History Narrative   Not on file   Social Drivers of Health   Tobacco Use: Low Risk (02/22/2024)   Received from [facility]   Patient History    Smoking Tobacco Use: Never    Smokeless Tobacco Use: Never    Passive Exposure: Not on file  Financial Resource Strain: Not on file  Food Insecurity: No Food Insecurity (09/12/2023)   Epic    Worried About Programme Researcher, Broadcasting/film/video in the Last Year: Never true    Ran Out of Food in the Last Year: Never true  Transportation Needs: No Transportation Needs (09/12/2023)   Epic    Lack of Transportation (Medical): No    Lack of Transportation (Non-Medical): No  Physical Activity: Not on file  Stress: Not on file  Social Connections: Not on file  Depression (EYV7-0): Not on file  Alcohol Screen: Not on file  Housing: Low Risk (09/12/2023)   Epic  Unable to Pay for Housing in the Last Year: No    Number of Times Moved in the Last Year: 0    Homeless in the Last Year: No  Utilities: Not At Risk (09/12/2023)   Epic    Threatened with loss of utilities: No  Health Literacy: Not on file   Allergies[1] Family History  Problem Relation Age of Onset   Arthritis Mother    Hypertension Mother    Kidney disease Mother    Diabetes Mother    Diabetes Father    Colon cancer Neg Hx    Colon polyps Neg Hx    Esophageal cancer Neg Hx    Rectal cancer Neg Hx    Stomach cancer Neg Hx    Pancreatic cancer Neg Hx     Current Outpatient Medications (Endocrine & Metabolic):    predniSONE  (DELTASONE ) 5 MG tablet, Take 5 mg by mouth daily with breakfast.  Current Outpatient Medications (Cardiovascular):    losartan  (COZAAR ) 50 MG tablet, Take 50 mg by mouth 2 (two) times daily.   torsemide (DEMADEX) 10 MG tablet, Take 10 mg by mouth.  Current Outpatient  Medications (Respiratory):    albuterol  (VENTOLIN  HFA) 108 (90 Base) MCG/ACT inhaler, Inhale 2 puffs into the lungs.   cetirizine (ZYRTEC) 10 MG tablet, Take 10 mg by mouth daily.  Current Outpatient Medications (Analgesics):    allopurinol  (ZYLOPRIM ) 100 MG tablet, Take 100 mg by mouth daily.   HYDROcodone -acetaminophen  (NORCO/VICODIN) 5-325 MG tablet, Take 1-2 tablets by mouth every 6 (six) hours as needed for moderate pain (pain score 4-6) or severe pain (pain score 7-10) (post op pain).  Current Outpatient Medications (Other):    amoxicillin (AMOXIL) 500 MG capsule, Take 500 mg by mouth.   Cholecalciferol (VITAMIN D ) 125 MCG (5000 UT) CAPS, Take 5,000 Units by mouth daily.    cycloSPORINE  modified (NEORAL ) 100 MG capsule, Take 100 mg by mouth 2 (two) times daily.   cycloSPORINE  modified (NEORAL ) 25 MG capsule, Take 25 mg by mouth 2 (two) times daily.   hydrocortisone  2.5 % cream, Apply 1 Application topically.   ketoconazole (NIZORAL) 2 % cream, Apply twice a day to affected areas   magnesium  oxide-pyridoxine  (BEELITH) 362-20 MG TABS, Take 1 tablet by mouth daily.   Multiple Vitamin (MULTI-VITAMIN) tablet, Take 1 tablet by mouth daily.   mycophenolate  (CELLCEPT ) 250 MG capsule, Take 750 mg by mouth 2 (two) times daily.   Omega-3 Fatty Acids (FISH OIL PO), Take 1 capsule by mouth daily.   tirzepatide  (ZEPBOUND ) 2.5 MG/0.5ML Pen, Inject 2.5 mg into the skin once a week.   Reviewed prior external information including notes and imaging from  primary care provider As well as notes that were available from care everywhere and other healthcare systems.  Past medical history, social, surgical and family history all reviewed in electronic medical record.  No pertanent information unless stated regarding to the chief complaint.   Review of Systems:  No headache, visual changes, nausea, vomiting, diarrhea, constipation, dizziness, abdominal pain, skin rash, fevers, chills, night sweats, weight  loss, swollen lymph nodes, body aches, joint swelling, chest pain, shortness of breath, mood changes. POSITIVE muscle aches  Objective  There were no vitals taken for this visit.   General: No apparent distress alert and oriented x3 mood and affect normal, dressed appropriately.  HEENT: Pupils equal, extraocular movements intact  Respiratory: Patient's speak in full sentences and does not appear short of breath  Cardiovascular: No lower extremity edema, non tender,  no erythema      Impression and Recommendations:           [1] No Known Allergies  "

## 2024-04-15 ENCOUNTER — Ambulatory Visit: Admitting: Family Medicine

## 2024-04-16 ENCOUNTER — Ambulatory Visit (INDEPENDENT_AMBULATORY_CARE_PROVIDER_SITE_OTHER): Admitting: Adult Health
